# Patient Record
Sex: Female | Born: 1935
Health system: Southern US, Community
[De-identification: ages and names within clinical notes are randomized; demographics above are authoritative.]

## PROBLEM LIST (undated history)

## (undated) DIAGNOSIS — I10 Essential (primary) hypertension: Secondary | ICD-10-CM

## (undated) DIAGNOSIS — I491 Atrial premature depolarization: Secondary | ICD-10-CM

## (undated) DIAGNOSIS — R55 Syncope and collapse: Secondary | ICD-10-CM

## (undated) HISTORY — DX: Essential (primary) hypertension: I10

## (undated) HISTORY — PX: HERNIA REPAIR: SHX51

## (undated) HISTORY — PX: TONSILLECTOMY: SUR1361

## (undated) HISTORY — DX: Atrial premature depolarization: I49.1

## (undated) HISTORY — DX: Syncope and collapse: R55

---

## 2001-07-09 ENCOUNTER — Other Ambulatory Visit: Admission: RE | Admit: 2001-07-09 | Discharge: 2001-07-09 | Payer: Self-pay | Admitting: *Deleted

## 2002-03-03 ENCOUNTER — Other Ambulatory Visit: Admission: RE | Admit: 2002-03-03 | Discharge: 2002-03-03 | Payer: Self-pay | Admitting: Family Medicine

## 2004-03-23 ENCOUNTER — Other Ambulatory Visit: Admission: RE | Admit: 2004-03-23 | Discharge: 2004-03-23 | Payer: Self-pay | Admitting: Family Medicine

## 2006-06-06 ENCOUNTER — Encounter: Admission: RE | Admit: 2006-06-06 | Discharge: 2006-06-06 | Payer: Self-pay | Admitting: Surgery

## 2006-06-07 ENCOUNTER — Ambulatory Visit (HOSPITAL_BASED_OUTPATIENT_CLINIC_OR_DEPARTMENT_OTHER): Admission: RE | Admit: 2006-06-07 | Discharge: 2006-06-07 | Payer: Self-pay | Admitting: Surgery

## 2006-09-12 ENCOUNTER — Other Ambulatory Visit: Admission: RE | Admit: 2006-09-12 | Discharge: 2006-09-12 | Payer: Self-pay | Admitting: Family Medicine

## 2017-02-26 ENCOUNTER — Observation Stay (HOSPITAL_COMMUNITY)
Admission: EM | Admit: 2017-02-26 | Discharge: 2017-02-27 | Disposition: A | Discharge status: Home / Self Care | Payer: Medicare Other | Attending: Internal Medicine | Admitting: Internal Medicine

## 2017-02-26 ENCOUNTER — Encounter (HOSPITAL_COMMUNITY): Payer: Self-pay | Admitting: Emergency Medicine

## 2017-02-26 ENCOUNTER — Emergency Department (HOSPITAL_COMMUNITY): Payer: Medicare Other

## 2017-02-26 DIAGNOSIS — I471 Supraventricular tachycardia: Secondary | ICD-10-CM | POA: Diagnosis not present

## 2017-02-26 DIAGNOSIS — Z79899 Other long term (current) drug therapy: Secondary | ICD-10-CM | POA: Diagnosis not present

## 2017-02-26 DIAGNOSIS — R55 Syncope and collapse: Principal | ICD-10-CM | POA: Diagnosis present

## 2017-02-26 DIAGNOSIS — E876 Hypokalemia: Secondary | ICD-10-CM | POA: Diagnosis not present

## 2017-02-26 DIAGNOSIS — I10 Essential (primary) hypertension: Secondary | ICD-10-CM

## 2017-02-26 DIAGNOSIS — I479 Paroxysmal tachycardia, unspecified: Secondary | ICD-10-CM | POA: Diagnosis not present

## 2017-02-26 DIAGNOSIS — Z7901 Long term (current) use of anticoagulants: Secondary | ICD-10-CM | POA: Diagnosis not present

## 2017-02-26 DIAGNOSIS — Z880 Allergy status to penicillin: Secondary | ICD-10-CM | POA: Diagnosis not present

## 2017-02-26 DIAGNOSIS — I7389 Other specified peripheral vascular diseases: Secondary | ICD-10-CM | POA: Insufficient documentation

## 2017-02-26 DIAGNOSIS — I480 Paroxysmal atrial fibrillation: Secondary | ICD-10-CM | POA: Diagnosis not present

## 2017-02-26 DIAGNOSIS — N289 Disorder of kidney and ureter, unspecified: Secondary | ICD-10-CM | POA: Diagnosis not present

## 2017-02-26 DIAGNOSIS — R000 Tachycardia, unspecified: Secondary | ICD-10-CM | POA: Diagnosis present

## 2017-02-26 DIAGNOSIS — Z886 Allergy status to analgesic agent status: Secondary | ICD-10-CM | POA: Diagnosis not present

## 2017-02-26 DIAGNOSIS — I081 Rheumatic disorders of both mitral and tricuspid valves: Secondary | ICD-10-CM | POA: Diagnosis not present

## 2017-02-26 LAB — URINALYSIS, ROUTINE W REFLEX MICROSCOPIC
BACTERIA UA: NONE SEEN
BILIRUBIN URINE: NEGATIVE
GLUCOSE, UA: NEGATIVE mg/dL
HGB URINE DIPSTICK: NEGATIVE
KETONES UR: NEGATIVE mg/dL
Nitrite: NEGATIVE
PROTEIN: NEGATIVE mg/dL
Specific Gravity, Urine: 1.009 (ref 1.005–1.030)
pH: 7 (ref 5.0–8.0)

## 2017-02-26 LAB — CBC
HEMATOCRIT: 39.8 % (ref 36.0–46.0)
HEMOGLOBIN: 13.5 g/dL (ref 12.0–15.0)
MCH: 32.1 pg (ref 26.0–34.0)
MCHC: 33.9 g/dL (ref 30.0–36.0)
MCV: 94.8 fL (ref 78.0–100.0)
Platelets: 197 10*3/uL (ref 150–400)
RBC: 4.2 MIL/uL (ref 3.87–5.11)
RDW: 12.3 % (ref 11.5–15.5)
WBC: 7.3 10*3/uL (ref 4.0–10.5)

## 2017-02-26 LAB — BASIC METABOLIC PANEL
ANION GAP: 12 (ref 5–15)
BUN: 19 mg/dL (ref 6–20)
CHLORIDE: 102 mmol/L (ref 101–111)
CO2: 22 mmol/L (ref 22–32)
Calcium: 10.2 mg/dL (ref 8.9–10.3)
Creatinine, Ser: 1.05 mg/dL — ABNORMAL HIGH (ref 0.44–1.00)
GFR calc Af Amer: 52 mL/min — ABNORMAL LOW (ref >60)
GFR calc non Af Amer: 45 mL/min — ABNORMAL LOW (ref >60)
Glucose, Bld: 118 mg/dL — ABNORMAL HIGH (ref 65–99)
POTASSIUM: 3.4 mmol/L — AB (ref 3.5–5.1)
SODIUM: 136 mmol/L (ref 135–145)

## 2017-02-26 LAB — PROTIME-INR
INR: 1.06
Prothrombin Time: 13.9 seconds (ref 11.4–15.2)

## 2017-02-26 LAB — I-STAT TROPONIN, ED: Troponin i, poc: 0 ng/mL (ref 0.00–0.08)

## 2017-02-26 LAB — CBG MONITORING, ED: GLUCOSE-CAPILLARY: 111 mg/dL — AB (ref 65–99)

## 2017-02-26 MED ORDER — SODIUM CHLORIDE 0.9 % IV SOLN
INTRAVENOUS | Status: DC
  Administered 2017-02-26: via INTRAVENOUS

## 2017-02-26 MED ORDER — ALBUTEROL SULFATE (2.5 MG/3ML) 0.083% IN NEBU: 2.5000 mg | INHALATION_SOLUTION | RESPIRATORY_TRACT | Status: DC | PRN

## 2017-02-26 MED ORDER — ENOXAPARIN SODIUM 30 MG/0.3ML ~~LOC~~ SOLN
30.0000 mg | SUBCUTANEOUS | Status: DC
  Filled 2017-02-26: qty 0.3

## 2017-02-26 MED ORDER — POTASSIUM CHLORIDE CRYS ER 20 MEQ PO TBCR
40.0000 meq | EXTENDED_RELEASE_TABLET | Freq: Once | ORAL | Status: AC
  Administered 2017-02-26: 40 meq via ORAL
  Filled 2017-02-26: qty 2

## 2017-02-26 MED ORDER — AMLODIPINE BESYLATE 5 MG PO TABS
5.0000 mg | ORAL_TABLET | Freq: Every day | ORAL | Status: DC
  Administered 2017-02-27: 5 mg via ORAL
  Filled 2017-02-26: qty 1

## 2017-02-26 MED ORDER — ACETAMINOPHEN 650 MG RE SUPP: 650.0000 mg | Freq: Four times a day (QID) | RECTAL | Status: DC | PRN

## 2017-02-26 MED ORDER — ONDANSETRON HCL 4 MG/2ML IJ SOLN: 4.0000 mg | Freq: Four times a day (QID) | INTRAMUSCULAR | Status: DC | PRN

## 2017-02-26 MED ORDER — ACETAMINOPHEN 325 MG PO TABS: 650.0000 mg | ORAL_TABLET | Freq: Four times a day (QID) | ORAL | Status: DC | PRN

## 2017-02-26 MED ORDER — ONDANSETRON HCL 4 MG PO TABS: 4.0000 mg | ORAL_TABLET | Freq: Four times a day (QID) | ORAL | Status: DC | PRN

## 2017-02-26 NOTE — ED Notes (Signed)
Attempted report x1. 

## 2017-02-26 NOTE — Consult Note (Signed)
CARDIOLOGY CONSULT NOTE   Referring Physician: Dr. Donnald Garre Primary Physician: [facility] (inpatient) Primary Cardiologist: none Reason for Consultation: syncope   HPI: Stephanie Reyes is a very pleasant 83 yo woman with PMH HTN who presents after sudden syncope. She states that she was speaking to a neighbor and very suddenly lost consciousness. She slowly fell to the floor and denies trauma. She denies any presyncopal symptoms, including palpitations, lightheadedness, warmth, tunnel vision, nausea, etc. Upon awakening she denies confusion, loss of bowel/bladder control, tongue biting, or weakness. She states that "if I was alone I would have walked myself to the chair and continued my business." She denies any similar episodes, but family members state that she has had several episodes of lightheadedness, and there was another LOC event approximately one year ago. No clear precipitating stressors, no associated inducing maneuvers (ie micturation). Has been eating and drinking well.  Patient is generally healthy and takes only her antihypertensives, amlodipine and lisinopril. She denies any prior heart history. She denies chest pain, SOB, PND, orthopnea, LE edema, fevers, or chills.   Review of Systems:     Cardiac Review of Systems: {Y] = yes  = no  Chest Pain [    ]  Resting SOB [   ] Exertional SOB  [  ]  Orthopnea [  ]   Pedal Edema [   ]    Palpitations [  ] Syncope  [Y  ]   Presyncope [   ]  General Review of Systems: [Y] = yes [  ]=no Constitional: recent weight change [  ]; anorexia [  ]; fatigue [  ]; nausea [  ]; night sweats [  ]; fever [  ]; or chills [  ];       Eyes : blurred vision [  ]; diplopia [   ]; vision changes [  ];  Amaurosis fugax[  ]; Resp: cough [  ];  wheezing[  ];  hemoptysis[  ];  PND [  ];  GI:  gallstones[  ], vomiting[  ];  dysphagia[  ]; melena[  ];  hematochezia [  ]; heartburn[  ];   GU: kidney stones [  ]; hematuria[  ];   dysuria [  ];   nocturia[  ]; incontinence [  ];             Skin: rash, swelling[  ];, hair loss[  ];  peripheral edema[  ];  or itching[  ]; Musculosketetal: myalgias[  ];  joint swelling[  ];  joint erythema[  ];  joint pain[  ];  back pain[  ];  Heme/Lymph: bruising[  ];  bleeding[  ];  anemia[  ];  Neuro: TIA[  ];  headaches[  ];  stroke[  ];  vertigo[  ];  seizures[  ];   paresthesias[  ];  difficulty walking[  ];  Psych:depression[  ]; anxiety[  ];  Endocrine: diabetes[  ];  thyroid dysfunction[  ];  Other:  Past Medical History:  Diagnosis Date  . Hypertension      (Not in a hospital admission)  Infusions:  Allergies  Allergen Reactions  . Ampicillin Nausea And Vomiting  . Aspirin Nausea And Vomiting  . Penicillins Nausea And Vomiting and Swelling    Social History   Social History  . Marital status: Divorced    Spouse name: N/A  . Number of children: N/A  . Years of education: N/A   Occupational History  .  Not on file.   Social History Main Topics  . Smoking status: Never Smoker  . Smokeless tobacco: Never Used  . Alcohol use No  . Drug use: No  . Sexual activity: Not on file   Other Topics Concern  . Not on file   Social History Narrative  . No narrative on file    No family history on file.  PHYSICAL EXAM: Vitals:   02/26/17 2200 02/26/17 2230  BP: 125/79 (!) 122/92  Pulse: 86 97  Resp: 16 20  Temp:      No intake or output data in the 24 hours ending 02/26/17 2333  General:  Well appearing. No respiratory difficulty HEENT: NCAT, MMM Neck: supple. no JVD. Carotids 2+ bilat; no bruits. No lymphadenopathy or thryomegaly appreciated. Cor: PMI nondisplaced. Slightly irregular rhythm, slightly tachycardic, exam more consistent with PACs than PVCs. No m/r/g Lungs: clear Abdomen: soft, nontender, nondistended. No hepatosplenomegaly. No bruits or masses. Good bowel sounds. Extremities: no cyanosis, clubbing, rash, edema Neuro: alert & oriented x 3, cranial  nerves grossly intact. moves all 4 extremities w/o difficulty. Affect pleasant.  ECG: sinus tachycardia with PACs. Somewhat variable PR, though P wave morphology looks similar on exam  Results for orders placed or performed during the hospital encounter of 02/26/17 (from the past 24 hour(s))  Basic metabolic panel     Status: Abnormal   Collection Time: 02/26/17  7:31 PM  Result Value Ref Range   Sodium 136 135 - 145 mmol/L   Potassium 3.4 (L) 3.5 - 5.1 mmol/L   Chloride 102 101 - 111 mmol/L   CO2 22 22 - 32 mmol/L   Glucose, Bld 118 (H) 65 - 99 mg/dL   BUN 19 6 - 20 mg/dL   Creatinine, Ser 1.61 (H) 0.44 - 1.00 mg/dL   Calcium 09.6 8.9 - 04.5 mg/dL   GFR calc non Af Amer 45 (L) >60 mL/min   GFR calc Af Amer 52 (L) >60 mL/min   Anion gap 12 5 - 15  CBC     Status: None   Collection Time: 02/26/17  7:31 PM  Result Value Ref Range   WBC 7.3 4.0 - 10.5 K/uL   RBC 4.20 3.87 - 5.11 MIL/uL   Hemoglobin 13.5 12.0 - 15.0 g/dL   HCT 40.9 81.1 - 91.4 %   MCV 94.8 78.0 - 100.0 fL   MCH 32.1 26.0 - 34.0 pg   MCHC 33.9 30.0 - 36.0 g/dL   RDW 78.2 95.6 - 21.3 %   Platelets 197 150 - 400 K/uL  CBG monitoring, ED     Status: Abnormal   Collection Time: 02/26/17  8:10 PM  Result Value Ref Range   Glucose-Capillary 111 (H) 65 - 99 mg/dL  Urinalysis, Routine w reflex microscopic     Status: Abnormal   Collection Time: 02/26/17  8:46 PM  Result Value Ref Range   Color, Urine STRAW (A) YELLOW   APPearance CLEAR CLEAR   Specific Gravity, Urine 1.009 1.005 - 1.030   pH 7.0 5.0 - 8.0   Glucose, UA NEGATIVE NEGATIVE mg/dL   Hgb urine dipstick NEGATIVE NEGATIVE   Bilirubin Urine NEGATIVE NEGATIVE   Ketones, ur NEGATIVE NEGATIVE mg/dL   Protein, ur NEGATIVE NEGATIVE mg/dL   Nitrite NEGATIVE NEGATIVE   Leukocytes, UA MODERATE (A) NEGATIVE   RBC / HPF 0-5 0 - 5 RBC/hpf   WBC, UA 0-5 0 - 5 WBC/hpf   Bacteria, UA NONE SEEN NONE SEEN  Squamous Epithelial / LPF 0-5 (A) NONE SEEN   Mucous PRESENT     Hyaline Casts, UA PRESENT   I-stat troponin, ED     Status: None   Collection Time: 02/26/17  8:53 PM  Result Value Ref Range   Troponin i, poc 0.00 0.00 - 0.08 ng/mL   Comment 3          Protime-INR     Status: None   Collection Time: 02/26/17  8:54 PM  Result Value Ref Range   Prothrombin Time 13.9 11.4 - 15.2 seconds   INR 1.06    Ct Head Wo Contrast  Result Date: 02/26/2017 CLINICAL DATA:  Acute onset of syncope.  Initial encounter. EXAM: CT HEAD WITHOUT CONTRAST TECHNIQUE: Contiguous axial images were obtained from the base of the skull through the vertex without intravenous contrast. COMPARISON:  None. FINDINGS: Brain: No evidence of acute infarction, hemorrhage, hydrocephalus, extra-axial collection or mass lesion/mass effect. Prominence of the ventricles and sulci reflects mild cortical volume loss. Cerebellar atrophy is noted. Scattered periventricular white matter change likely reflects small vessel ischemic microangiopathy. The brainstem and fourth ventricle are within normal limits. The basal ganglia are unremarkable in appearance. The cerebral hemispheres demonstrate grossly normal gray-white differentiation. No mass effect or midline shift is seen. Vascular: No hyperdense vessel or unexpected calcification. Skull: There is no evidence of fracture; visualized osseous structures are unremarkable in appearance. There is suggestion of narrowing of the upper cervical spinal canal to 7 mm in AP dimension. Sinuses/Orbits: The orbits are within normal limits. The paranasal sinuses and mastoid air cells are well-aerated. Other: No significant soft tissue abnormalities are seen. IMPRESSION: 1. No acute intracranial pathology seen on CT. 2. Mild cortical volume loss and scattered small vessel ischemic microangiopathy. 3. Suggestion of chronic narrowing of the upper cervical spinal canal to 7 mm in AP dimension. Electronically Signed   By: Roanna Raider M.D.   On: 02/26/2017 21:47      ASSESSMENT/PLAN: Corrine Buskirk is a very pleasant 83 yo woman with PMH HTN presenting with syncope. Denies prior cardiac history. No prodrome or postictal symptoms. Family endorses one prior event one year ago with full LOC, does have some lightheadedness. No chest pain or heart failure symptoms  1) Syncope: age is risk factor for conduction disease. Was having frequent PACs on my exam -monitor on telemetry overnight to exclude pauses or arrhythmias -orthostatic vital signs -echo in AM to rule out structural heart disease -if unrevealing workup, could consider Holter as outpatient -cardiology team to see tomorrow to review data

## 2017-02-26 NOTE — ED Provider Notes (Signed)
[facility]-EMERGENCY DEPT Provider Note   CSN: [id number] Arrival date & time: 02/26/17  1909     History   Chief Complaint Chief Complaint  Patient presents with  . Loss of Consciousness  . Irregular Heart Beat    HPI Stephanie Reyes is a 83 y.o. female.  HPI At baseline, patient is very active and lives in independent lifestyle. Today she had already been to meetings and was back home preparing her lunch. Neighbor came to the door and as she was talking to that neighbor she suddenly collapsed to the ground. She denies she had any preceding symptoms. The neighbor described to her as if she just sort of folded and sank to the ground. He denies any associated injury. She reports that she awakened with the neighbor placing a pillow under her head. She reports initially she was aware that the other person was doing that but could not respond but then quickly became aware and was back to normal. 9 she had any headache or focal weakness numbness or tingling. No medication changes. She takes her lisinopril and amlodipine in the mornings. Past Medical History:  Diagnosis Date  . Hypertension     There are no active problems to display for this patient.   Past Surgical History:  Procedure Laterality Date  . HERNIA REPAIR    . TONSILLECTOMY      OB History    No data available       Home Medications    Prior to Admission medications   Medication Sig Start Date End Date Taking? Authorizing Provider  amLODipine (NORVASC) 5 MG tablet Take 5 mg by mouth daily. 12/04/16  Yes Historical Provider, MD  lisinopril-hydrochlorothiazide (PRINZIDE,ZESTORETIC) 20-25 MG tablet Take 1 tablet by mouth daily. 12/06/16  Yes Historical Provider, MD  Multiple Vitamin (MULTIVITAMIN WITH MINERALS) TABS tablet Take 1 tablet by mouth every other day.   Yes Historical Provider, MD  OVER THE COUNTER MEDICATION Take 1 tablet by mouth every other day. For bones   Yes Historical Provider, MD    Family  History No family history on file.  Social History Social History  Substance Use Topics  . Smoking status: Never Smoker  . Smokeless tobacco: Never Used  . Alcohol use No     Allergies   Ampicillin; Aspirin; and Penicillins   Review of Systems Review of Systems 10 Systems reviewed and are negative for acute change except as noted in the HPI.   Physical Exam Updated Vital Signs Temp 97.6 F (36.4 C) (Oral)   Ht  (1.651 m)   Wt 120 lb (54.4 kg)   SpO2 97%   BMI 19.97 kg/m   Physical Exam  Constitutional: She is oriented to person, place, and time. She appears well-developed and well-nourished. No distress.  HENT:  Head: Normocephalic and atraumatic.  Right Ear: External ear normal.  Left Ear: External ear normal.  Nose: Nose normal.  Mouth/Throat: Oropharynx is clear and moist.  Eyes: Conjunctivae and EOM are normal. Pupils are equal, round, and reactive to light.  Neck: Neck supple.  Cardiovascular: Normal rate and intact distal pulses.   No murmur heard. Borderline tachycardia with frequent ectopy.  Pulmonary/Chest: Effort normal and breath sounds normal. No respiratory distress.  Abdominal: Soft. She exhibits no distension. There is no tenderness.  Musculoskeletal: Normal range of motion. She exhibits no edema, tenderness or deformity.  Neurological: She is alert and oriented to person, place, and time. No cranial nerve deficit. She exhibits normal muscle  tone. Coordination normal.  Patient has excellent strength and normal range of motion. She can fully flex lower extremities and push firmly against resistance.  Skin: Skin is warm and dry.  Psychiatric: She has a normal mood and affect.  Nursing note and vitals reviewed.    ED Treatments / Results  Labs (all labs ordered are listed, but only abnormal results are displayed) Labs Reviewed  BASIC METABOLIC PANEL - Abnormal; Notable for the following:       Result Value   Potassium 3.4 (*)    Glucose,  Bld 118 (*)    Creatinine, Ser 1.05 (*)    GFR calc non Af Amer 45 (*)    GFR calc Af Amer 52 (*)    All other components within normal limits  URINALYSIS, ROUTINE W REFLEX MICROSCOPIC - Abnormal; Notable for the following:    Color, Urine STRAW (*)    Leukocytes, UA MODERATE (*)    Squamous Epithelial / LPF 0-5 (*)    All other components within normal limits  CBG MONITORING, ED - Abnormal; Notable for the following:    Glucose-Capillary 111 (*)    All other components within normal limits  CBC  PROTIME-INR  I-STAT TROPOININ, ED    EKG  EKG Interpretation None       Radiology Ct Head Wo Contrast  Result Date: 02/26/2017 CLINICAL DATA:  Acute onset of syncope.  Initial encounter. EXAM: CT HEAD WITHOUT CONTRAST TECHNIQUE: Contiguous axial images were obtained from the base of the skull through the vertex without intravenous contrast. COMPARISON:  None. FINDINGS: Brain: No evidence of acute infarction, hemorrhage, hydrocephalus, extra-axial collection or mass lesion/mass effect. Prominence of the ventricles and sulci reflects mild cortical volume loss. Cerebellar atrophy is noted. Scattered periventricular white matter change likely reflects small vessel ischemic microangiopathy. The brainstem and fourth ventricle are within normal limits. The basal ganglia are unremarkable in appearance. The cerebral hemispheres demonstrate grossly normal gray-white differentiation. No mass effect or midline shift is seen. Vascular: No hyperdense vessel or unexpected calcification. Skull: There is no evidence of fracture; visualized osseous structures are unremarkable in appearance. There is suggestion of narrowing of the upper cervical spinal canal to 7 mm in AP dimension. Sinuses/Orbits: The orbits are within normal limits. The paranasal sinuses and mastoid air cells are well-aerated. Other: No significant soft tissue abnormalities are seen. IMPRESSION: 1. No acute intracranial pathology seen on CT. 2.  Mild cortical volume loss and scattered small vessel ischemic microangiopathy. 3. Suggestion of chronic narrowing of the upper cervical spinal canal to 7 mm in AP dimension. Electronically Signed   By: Roanna Raider M.D.   On: 02/26/2017 21:47    Procedures Procedures (including critical care time)  Medications Ordered in ED Medications  potassium chloride SA (K-DUR,KLOR-CON) CR tablet 40 mEq (not administered)     Initial Impression / Assessment and Plan / ED Course  I have reviewed the triage vital signs and the nursing notes.  Pertinent labs & imaging results that were available during my care of the patient were reviewed by me and considered in my medical decision making (see chart for details).     Consult: Cardiology fellow. Recommend overnight monitored observation on hospitalist service, a.m. echo Consult: [facility] for [facility] Final Clinical Impressions(s) / ED Diagnoses   Final diagnoses:  Syncope and collapse  Patient is otherwise very healthy and active 83 year old female. She had unprovoked and syncopal episode. At this time, plan will be for ongoing monitoring and additional  diagnostic evaluation in the a.m.  New Prescriptions New Prescriptions   No medications on file     Arby Barrette, MD 02/26/17 2234

## 2017-02-26 NOTE — H&P (Addendum)
History and Physical    ANGELEIGH CHIASSON Reyes:[id number] DOB: 02-14-1924 DOA: 02/26/2017  Referring MD/NP/PA: Dr. Clarice Pole PCP: Hollice Espy, MD  Patient coming from: Apartment via EMS  Chief Complaint: Syncope and collapse  HPI: Stephanie Reyes is a 83 y.o. female with medical history significant of HTN; who presents after having syncopal episode at home. At baseline the patient is very active and lives alone in an apartment complex, and does not utilize any assistive aids to ambulate.Patient reports waking up this morning having a small breakfast and taking her blood pressure medications as usual. She did not have time to eat her lunch because of a presentation that she was getting about tiny houses later that day. Following the meeting sometime around 4:30 PM she was about to eat her lunch the doorbell rang. She went to the door and reportedly was staring off blankly at the neighbor unable to speak prior to folding up and sinking to the floor. Denies any trauma to her head, chest pain, palpitations, focal weakness, dysuria, or urinary frequency. Patient notes that her Sandrea Hammond was discontinued recently, but denies any other medication changes. Family notes that the patient has intermittently reported being dizzy with standing  ED Course: Upon admission into the emergency department patient was seen to be afebrile, heart rates 86-104, respirations 16-24, blood pressures maintain, and O2 saturations 95-98% on room air. Labs revealed Potassium 3.4, BUN 19, creatinine 1.05, and all other labs relatively within normal limits. CT scan of the brain showed no acute abnormalities and chronic narrowing of the cervical spinal canal. Family asked that the patient notes intermittently  Review of Systems: As per HPI otherwise 10 point review of systems negative.   Past Medical History:  Diagnosis Date  . Hypertension     Past Surgical History:  Procedure Laterality Date  . HERNIA REPAIR    .  TONSILLECTOMY       reports that she has never smoked. She has never used smokeless tobacco. She reports that she does not drink alcohol or use drugs.  Allergies  Allergen Reactions  . Ampicillin Nausea And Vomiting  . Aspirin Nausea And Vomiting  . Penicillins Nausea And Vomiting and Swelling    No family history on file.  Prior to Admission medications   Medication Sig Start Date End Date Taking? Authorizing Provider  amLODipine (NORVASC) 5 MG tablet Take 5 mg by mouth daily. 12/04/16  Yes Historical Provider, MD  lisinopril-hydrochlorothiazide (PRINZIDE,ZESTORETIC) 20-25 MG tablet Take 1 tablet by mouth daily. 12/06/16  Yes Historical Provider, MD  Multiple Vitamin (MULTIVITAMIN WITH MINERALS) TABS tablet Take 1 tablet by mouth every other day.   Yes Historical Provider, MD  OVER THE COUNTER MEDICATION Take 1 tablet by mouth every other day. For bones   Yes Historical Provider, MD    Physical Exam:   Constitutional: NAD, calm, comfortable Vitals:   02/26/17 2030 02/26/17 2100 02/26/17 2200 02/26/17 2230  BP: (!) 137/98 117/87 125/79 (!) 122/92  Pulse: (!) 104 91 86 97  Resp: (!) Temp:      TempSrc:      SpO2: 95% 98% 98% 98%  Weight:      Height:       Eyes: PERRL, lids and conjunctivae normal ENMT: Mucous membranes are moist. Posterior pharynx clear of any exudate or lesions.Normal dentition.  Neck: normal, supple, no masses, no thyromegaly Respiratory: clear to auscultation bilaterally, no wheezing, no crackles. Normal respiratory effort. No accessory muscle use.  Cardiovascular: Tachycardic with intermittent extra beats, no murmurs / rubs / gallops. No extremity edema. 2+ pedal pulses. No carotid bruits.  Abdomen: no tenderness, no masses palpated. No hepatosplenomegaly. Bowel sounds positive.  Musculoskeletal: no clubbing / cyanosis. No joint deformity upper and lower extremities. Good ROM, no contractures. Normal muscle tone.  Skin: Poor skin turgor with  tenting noted. no rashes, lesions, ulcers. No induration Neurologic: CN 2-12 grossly intact. Sensation intact, DTR normal. Strength 5/5 in all 4.  Psychiatric: Normal judgment and insight. Alert and oriented x 3. Normal mood.     Labs on Admission: I have personally reviewed following labs and imaging studies  CBC:  Recent Labs Lab 02/26/17 1931  WBC 7.3  HGB 13.5  HCT 39.8  MCV 94.8  PLT 197   Basic Metabolic Panel:  Recent Labs Lab 02/26/17 1931  NA 136  K 3.4*  CL 102  CO2 22  GLUCOSE 118*  BUN 19  CREATININE 1.05*  CALCIUM 10.2   GFR: Estimated Creatinine Clearance: 30 mL/min (A) (by C-G formula based on SCr of 1.05 mg/dL (H)). Liver Function Tests: No results for input(s): AST, ALT, ALKPHOS, BILITOT, PROT, ALBUMIN in the last 168 hours. No results for input(s): LIPASE, AMYLASE in the last 168 hours. No results for input(s): AMMONIA in the last 168 hours. Coagulation Profile:  Recent Labs Lab 02/26/17 2054  INR 1.06   Cardiac Enzymes: No results for input(s): CKTOTAL, CKMB, CKMBINDEX, TROPONINI in the last 168 hours. BNP (last 3 results) No results for input(s): PROBNP in the last 8760 hours. HbA1C: No results for input(s): HGBA1C in the last 72 hours. CBG:  Recent Labs Lab 02/26/17 2010  GLUCAP 111*   Lipid Profile: No results for input(s): CHOL, HDL, LDLCALC, TRIG, CHOLHDL, LDLDIRECT in the last 72 hours. Thyroid Function Tests: No results for input(s): TSH, T4TOTAL, FREET4, T3FREE, THYROIDAB in the last 72 hours. Anemia Panel: No results for input(s): VITAMINB12, FOLATE, FERRITIN, TIBC, IRON, RETICCTPCT in the last 72 hours. Urine analysis:    Component Value Date/Time   COLORURINE STRAW (A) 02/26/2017 2046   APPEARANCEUR CLEAR 02/26/2017 2046   LABSPEC 1.009 02/26/2017 2046   PHURINE 7.0 02/26/2017 2046   GLUCOSEU NEGATIVE 02/26/2017 2046   HGBUR NEGATIVE 02/26/2017 2046   BILIRUBINUR NEGATIVE 02/26/2017 2046   KETONESUR NEGATIVE  02/26/2017 2046   PROTEINUR NEGATIVE 02/26/2017 2046   NITRITE NEGATIVE 02/26/2017 2046   LEUKOCYTESUR MODERATE (A) 02/26/2017 2046   Sepsis Labs: No results found for this or any previous visit (from the past 240 hour(s)).   Radiological Exams on Admission: Ct Head Wo Contrast  Result Date: 02/26/2017 CLINICAL DATA:  Acute onset of syncope.  Initial encounter. EXAM: CT HEAD WITHOUT CONTRAST TECHNIQUE: Contiguous axial images were obtained from the base of the skull through the vertex without intravenous contrast. COMPARISON:  None. FINDINGS: Brain: No evidence of acute infarction, hemorrhage, hydrocephalus, extra-axial collection or mass lesion/mass effect. Prominence of the ventricles and sulci reflects mild cortical volume loss. Cerebellar atrophy is noted. Scattered periventricular white matter change likely reflects small vessel ischemic microangiopathy. The brainstem and fourth ventricle are within normal limits. The basal ganglia are unremarkable in appearance. The cerebral hemispheres demonstrate grossly normal gray-white differentiation. No mass effect or midline shift is seen. Vascular: No hyperdense vessel or unexpected calcification. Skull: There is no evidence of fracture; visualized osseous structures are unremarkable in appearance. There is suggestion of narrowing of the upper cervical spinal canal to 7 mm in AP dimension. Sinuses/Orbits:  The orbits are within normal limits. The paranasal sinuses and mastoid air cells are well-aerated. Other: No significant soft tissue abnormalities are seen. IMPRESSION: 1. No acute intracranial pathology seen on CT. 2. Mild cortical volume loss and scattered small vessel ischemic microangiopathy. 3. Suggestion of chronic narrowing of the upper cervical spinal canal to 7 mm in AP dimension. Electronically Signed   By: Roanna Raider M.D.   On: 02/26/2017 21:47    EKG: Independently reviewed. Sinus tachycardia with intermittent premature  complexes.  Assessment/Plan Syncope and collapse: Acute. Patient reports having a busy day with decreased oral intake. Orthostatic vital signs showed significant increase in heart rate 28 bpm with sitting to standing although no significant decrease in blood pressure noted. Question possibility of POTS  dehydration as  cause of symptoms. Cardiology was consulted by the ER physician and recommended checking an echocardiogram and monitoring overnight. - Admit to telemetry bed - IV fluids NS at 75 ml/hr - Check echocardiogram in a.m - recheck orthostatic vital signs in a.m. - Physical therapy to eval and treat and  Hypokalemia: Acute. Patient's initial potassium was mildly low at 3.4. Patient was given 40 meq of potassium chloride while in the ED. - Continue to monitor, and replace as needed  Sinus tachycardia - Follow-up telemetry overnight  Essential hypertension  - Continue amlodipine  - hold lisinopril-hydrochlorothiazide as question an aspect of dehydration  Renal insufficiency: Creatinine on admission noted be 1.05 with BUN 19. No other previous values available. Question the possibility of some aspect of dehydration to the patient's presenting symptoms. - Follow repeat BMP in a.m.   DVT prophylaxis: lovenox   Code Status: DNR   Family Communication: Discussed plan of care with the patient and family present at bedside  Disposition Plan:  Likely discharge home if work up negative. Consults called: Cards Admission status: inpatient  Clydie Braun MD [facility] Pager [phone]  If 7PM-7AM, please contact night-coverage [email or web address] Password [facility]  02/26/2017, 11:20 PM

## 2017-02-26 NOTE — ED Triage Notes (Addendum)
Per [facility],  Pt from home where she lives alone. Pt reports being at a meeting with a friend, she stopped talking and stared off for a minute and lost consciousness. Pt states she may have hit her head on the door. Pt reports she did not eat/drink as well today as she normally does. Pt denies pain at this time. Pt reports some dizziness and nausea. Pt denies chest pain, SHOB. Pt only has hx of HTN. EMS reports pt in A-fib. Pt currently in A-fib, HR currently 101. Pt alert and oriented, VSS.

## 2017-02-27 ENCOUNTER — Other Ambulatory Visit: Payer: Self-pay | Admitting: Physician Assistant

## 2017-02-27 ENCOUNTER — Observation Stay (HOSPITAL_BASED_OUTPATIENT_CLINIC_OR_DEPARTMENT_OTHER): Payer: Medicare Other

## 2017-02-27 DIAGNOSIS — I10 Essential (primary) hypertension: Secondary | ICD-10-CM | POA: Diagnosis present

## 2017-02-27 DIAGNOSIS — R946 Abnormal results of thyroid function studies: Secondary | ICD-10-CM | POA: Diagnosis not present

## 2017-02-27 DIAGNOSIS — I480 Paroxysmal atrial fibrillation: Secondary | ICD-10-CM

## 2017-02-27 DIAGNOSIS — R55 Syncope and collapse: Secondary | ICD-10-CM | POA: Diagnosis not present

## 2017-02-27 DIAGNOSIS — R000 Tachycardia, unspecified: Secondary | ICD-10-CM

## 2017-02-27 DIAGNOSIS — I479 Paroxysmal tachycardia, unspecified: Secondary | ICD-10-CM | POA: Diagnosis not present

## 2017-02-27 DIAGNOSIS — N289 Disorder of kidney and ureter, unspecified: Secondary | ICD-10-CM | POA: Diagnosis not present

## 2017-02-27 DIAGNOSIS — E876 Hypokalemia: Secondary | ICD-10-CM | POA: Diagnosis not present

## 2017-02-27 LAB — CBC WITH DIFFERENTIAL/PLATELET
Basophils Absolute: 0 10*3/uL (ref 0.0–0.1)
Basophils Relative: 0 %
EOS ABS: 0 10*3/uL (ref 0.0–0.7)
Eosinophils Relative: 0 %
HCT: 41.2 % (ref 36.0–46.0)
Hemoglobin: 14.1 g/dL (ref 12.0–15.0)
LYMPHS ABS: 1.6 10*3/uL (ref 0.7–4.0)
Lymphocytes Relative: 22 %
MCH: 32.6 pg (ref 26.0–34.0)
MCHC: 34.2 g/dL (ref 30.0–36.0)
MCV: 95.2 fL (ref 78.0–100.0)
Monocytes Absolute: 0.4 10*3/uL (ref 0.1–1.0)
Monocytes Relative: 6 %
NEUTROS PCT: 72 %
Neutro Abs: 5.2 10*3/uL (ref 1.7–7.7)
PLATELETS: 231 10*3/uL (ref 150–400)
RBC: 4.33 MIL/uL (ref 3.87–5.11)
RDW: 12.2 % (ref 11.5–15.5)
WBC: 7.2 10*3/uL (ref 4.0–10.5)

## 2017-02-27 LAB — ECHOCARDIOGRAM COMPLETE
CHL CUP RV SYS PRESS: 52 mmHg
E decel time: 296 msec
EERAT: 10.05
FS: 36 % (ref 28–44)
Height: 65 in
IV/PV OW: 1.14
LA ID, A-P, ES: 30 mm
LA diam end sys: 30 mm
LA vol A4C: 31.2 ml
LA vol index: 24.7 mL/m2
LA vol: 39.2 mL
LADIAMINDEX: 1.89 cm/m2
LV TDI E'LATERAL: 8.25
LV TDI E'MEDIAL: 4.39
LVEEAVG: 10.05
LVEEMED: 10.05
LVELAT: 8.25 cm/s
LVOT VTI: 12.8 cm
LVOT area: 3.46 cm2
LVOTD: 21 mm
LVOTPV: 72.4 cm/s
LVOTSV: 44 mL
MV Dec: 296
MV pk A vel: 87.8 m/s
MV pk E vel: 82.9 m/s
MVPG: 3 mmHg
P 1/2 time: 619 ms
PW: 12 mm — AB (ref 0.6–1.1)
RV LATERAL S' VELOCITY: 15.7 cm/s
Reg peak vel: 350 cm/s
TAPSE: 21.5 mm
TR max vel: 350 cm/s
Weight: 1912 oz

## 2017-02-27 LAB — BASIC METABOLIC PANEL
Anion gap: 10 (ref 5–15)
BUN: 16 mg/dL (ref 6–20)
CALCIUM: 9.8 mg/dL (ref 8.9–10.3)
CO2: 23 mmol/L (ref 22–32)
CREATININE: 1.04 mg/dL — AB (ref 0.44–1.00)
Chloride: 102 mmol/L (ref 101–111)
GFR calc Af Amer: 53 mL/min — ABNORMAL LOW (ref >60)
GFR, EST NON AFRICAN AMERICAN: 46 mL/min — AB (ref >60)
GLUCOSE: 171 mg/dL — AB (ref 65–99)
Potassium: 3.6 mmol/L (ref 3.5–5.1)
Sodium: 135 mmol/L (ref 135–145)

## 2017-02-27 LAB — MAGNESIUM: MAGNESIUM: 1.6 mg/dL — AB (ref 1.7–2.4)

## 2017-02-27 LAB — TROPONIN I: Troponin I: <0.03 ng/mL (ref <0.03)

## 2017-02-27 LAB — T4, FREE: Free T4: 0.81 ng/dL (ref 0.61–1.12)

## 2017-02-27 LAB — TSH: TSH: 6.118 u[IU]/mL — ABNORMAL HIGH (ref 0.350–4.500)

## 2017-02-27 MED ORDER — MAGNESIUM SULFATE 2 GM/50ML IV SOLN
2.0000 g | Freq: Once | INTRAVENOUS | Status: AC
  Administered 2017-02-27: 2 g via INTRAVENOUS
  Filled 2017-02-27: qty 50

## 2017-02-27 MED ORDER — AMLODIPINE BESYLATE 5 MG PO TABS: 2.5000 mg | ORAL_TABLET | Freq: Every day | ORAL | 0 refills | Status: DC

## 2017-02-27 MED ORDER — METOPROLOL TARTRATE 12.5 MG HALF TABLET
12.5000 mg | ORAL_TABLET | Freq: Two times a day (BID) | ORAL | Status: DC
  Administered 2017-02-27: 12.5 mg via ORAL
  Filled 2017-02-27: qty 1

## 2017-02-27 MED ORDER — METOPROLOL TARTRATE 25 MG PO TABS: 12.5000 mg | ORAL_TABLET | Freq: Two times a day (BID) | ORAL | 0 refills | Status: DC

## 2017-02-27 MED ORDER — AMLODIPINE BESYLATE 5 MG PO TABS: 2.5000 mg | ORAL_TABLET | Freq: Every day | ORAL | Status: DC

## 2017-02-27 NOTE — Discharge Summary (Signed)
Physician Discharge Summary  Stephanie Reyes ZOX:[id number] DOB: [birth year] DOA: 02/26/2017  PCP: Hollice Espy, MD  Admit date: 02/26/2017 Discharge date: 02/27/2017  Admitted From: home Disposition:  home  Recommendations for Outpatient Follow-up:  1. Follow up with PCP in 1-2 weeks. Please follow free T3 and T4 as outpatient. 2. Follow up with [facility] cardiology in 2 weeks. will arrange outpatient holter monitoring  Home Health:none Equipment/Devices: none  Discharge Condition:Stable CODE STATUS: DO NOT RESUSCITATE Diet recommendation: Regular    Discharge Diagnoses:  Principal Problem:   Syncope and collapse   Active Problems:   Hypokalemia   Benign essential HTN   Renal insufficiency   AF (paroxysmal atrial fibrillation) (HCC)   Sinus tachycardia  Brief narrative/history of present illness 83 year old female with history of hypertension presented with a syncopal episode at home. At baseline she is quite active and lives in an apartment complex. On the afternoon of admission she went to open her door and reportedly was stating blankly at her neighbor, unable to speak prior to slumping on the floor. No head trauma, chest pain, headache, dizziness, palpitations, bowel or urinary incontinence. She was on Boniva which was discontinued recently. No other change in medications. As per family she was intermittently complaining of being dizzy on standing.  In the ED she was afebrile, heart rate was ranging between 80-105, blood pressure was stable but on checking orthostasis she was tachycardic to 120s (no significant drop in blood pressure). She had potassium of 3.4 but other labs were normal. Head CT was unremarkable for acute findings. EKG and troponin was unremarkable. Patient was placed on observation for further management. Cardiology consulted.  Hospital course Syncope and collapse (HCC) -Possibly orthostatic versus paroxysmal tachycardia seen on telemetry. The heart  rate was irregularly irregular but no clear A. fib and could sinus tachycardia with PACs versus MAT. -Held HCTZ/lisinopril after cardiology recommendations and reduced amlodipine dose to 2.5 mg daily. Added low dose metoprolol (2.5 mg twice a day) 2-D echo shows normal EF of 55-60 %, no wall motion abnormality, shows grade 1 diastolic dysfunction mild MR, moderate TR and moderately elevated pulmonary pressure (52 mmHg). -Patient remains asymptomatic. Seen by PT and no further need.  Patient's CHADS2VASC score is 4. Cardiology recommended to hold on anticoagulation and obtain 7 outpatient Holter monitoring. If Holter shows A. fib, plan on starting her on low-dose Eliquis 2.5 mg bid. She is allergic to aspirin.  Paroxysmal A. fib versus sinus tachycardia with PACs. Plan as outlined above. Added low dose beta blocker. 2-D echo findings as above. Holter monitoring as outpatient and cardiology follow-up.  Hypokalemia/hypomagnesemia Replenished  ? Elevated TSH Order free T3 and T4. Can be followed as outpatient.  Stable to be discharged home with outpatient follow-up.  Consults: Cardiology  Procedures:  Head CT 2-D echo  Family communication: Son at bedside  Discharge Instructions   Allergies as of 02/27/2017      Reactions   Ampicillin Nausea And Vomiting   Aspirin Nausea And Vomiting   Penicillins Nausea And Vomiting, Swelling      Medication List    STOP taking these medications   lisinopril-hydrochlorothiazide 20-25 MG tablet Commonly known as:  PRINZIDE,ZESTORETIC     TAKE these medications   amLODipine 5 MG tablet Commonly known as:  NORVASC Take 0.5 tablets (2.5 mg total) by mouth daily. What changed:  how much to take   metoprolol tartrate 25 MG tablet Commonly known as:  LOPRESSOR Take 0.5 tablets (12.5 mg total)  by mouth 2 (two) times daily.   multivitamin with minerals Tabs tablet Take 1 tablet by mouth every other day.   OVER THE COUNTER MEDICATION Take  1 tablet by mouth every other day. For bones      Follow-up Information    Hollice Espy, MD. Schedule an appointment as soon as possible for a visit in 1 week(s).   Specialty:  Family Medicine Contact information: [address] [place] [address] [phone]        Chrystie Nose, MD. Call in 2 week(s).   Specialty:  Cardiology Contact information: [address] [place] [address] [place] [address] [phone]          Allergies  Allergen Reactions  . Ampicillin Nausea And Vomiting  . Aspirin Nausea And Vomiting  . Penicillins Nausea And Vomiting and Swelling      Procedures/Studies: Ct Head Wo Contrast  Result Date: 02/26/2017 CLINICAL DATA:  Acute onset of syncope.  Initial encounter. EXAM: CT HEAD WITHOUT CONTRAST TECHNIQUE: Contiguous axial images were obtained from the base of the skull through the vertex without intravenous contrast. COMPARISON:  None. FINDINGS: Brain: No evidence of acute infarction, hemorrhage, hydrocephalus, extra-axial collection or mass lesion/mass effect. Prominence of the ventricles and sulci reflects mild cortical volume loss. Cerebellar atrophy is noted. Scattered periventricular white matter change likely reflects small vessel ischemic microangiopathy. The brainstem and fourth ventricle are within normal limits. The basal ganglia are unremarkable in appearance. The cerebral hemispheres demonstrate grossly normal gray-white differentiation. No mass effect or midline shift is seen. Vascular: No hyperdense vessel or unexpected calcification. Skull: There is no evidence of fracture; visualized osseous structures are unremarkable in appearance. There is suggestion of narrowing of the upper cervical spinal canal to 7 mm in AP dimension. Sinuses/Orbits: The orbits are within normal limits. The paranasal sinuses and mastoid air cells are well-aerated. Other: No significant soft tissue abnormalities are seen. IMPRESSION: 1. No  acute intracranial pathology seen on CT. 2. Mild cortical volume loss and scattered small vessel ischemic microangiopathy. 3. Suggestion of chronic narrowing of the upper cervical spinal canal to 7 mm in AP dimension. Electronically Signed   By: Roanna Raider M.D.   On: 02/26/2017 21:47    2-D echo Study Conclusions  - Left ventricle: The cavity size was normal. There was mild focal   basal hypertrophy of the septum. Systolic function was normal.   The estimated ejection fraction was in the range of 55% to 60%.   Wall motion was normal; there were no regional wall motion   abnormalities. Doppler parameters are consistent with abnormal   left ventricular relaxation (grade 1 diastolic dysfunction).   Doppler parameters are consistent with high ventricular filling   pressure. - Aortic valve: There was trivial regurgitation. - Aortic root: The aortic root was mildly dilated. - Mitral valve: There was mild regurgitation. - Tricuspid valve: There was moderate regurgitation. - Pulmonary arteries: Systolic pressure was moderately increased.   PA peak pressure: 52 mm Hg (S).  Impressions:  - Normal LV systolic function; mild LVH; grade 1 diastolic   dysfunction; mildly dilated aortic root; trace AI; mild MR;   moderate TR; moderately elevated pulmonary pressure.  Subjective: Denies any dizziness or lightheadedness. Denies chest pain or palpitations. Able to walk to the bathroom without difficulty.  Discharge Exam: Vitals:   02/27/17 1026 02/27/17 1027  BP:    Pulse: (!) 150 72  Resp:  18  Temp:  98.1 F (36.7 C)  Vitals:   02/26/17 2347 02/27/17 0637 02/27/17 1026 02/27/17 1027  BP: 117/89 136/83    Pulse: (!) 122 86 (!) 150 72  Resp: Temp: 97.7 F (36.5 C) 97.4 F (36.3 C)  98.1 F (36.7 C)  TempSrc: Oral Oral  Oral  SpO2: 97% 99%  97%  Weight: 54.2 kg (119 lb 8 oz)     Height:  (1.651 m)       General: Elderly female not in distress HEENT: Moist  mucosa, supple neck Chest: Clear to auscultation bilaterally CVS: S1 and S2 regular, no murmurs rub or gallop GI: Soft, nondistended, nontender Musculoskeletal: Warm, no edema     The results of significant diagnostics from this hospitalization (including imaging, microbiology, ancillary and laboratory) are listed below for reference.     Microbiology: No results found for this or any previous visit (from the past 240 hour(s)).   Labs: BNP (last 3 results) No results for input(s): BNP in the last 8760 hours. Basic Metabolic Panel:  Recent Labs Lab 02/26/17 1931 02/27/17 0003  NA 136 135  K 3.4* 3.6  CL 102 102  CO2 22 23  GLUCOSE 118* 171*  BUN 19 16  CREATININE 1.05* 1.04*  CALCIUM 10.2 9.8  MG  --  1.6*   Liver Function Tests: No results for input(s): AST, ALT, ALKPHOS, BILITOT, PROT, ALBUMIN in the last 168 hours. No results for input(s): LIPASE, AMYLASE in the last 168 hours. No results for input(s): AMMONIA in the last 168 hours. CBC:  Recent Labs Lab 02/26/17 1931 02/27/17 0003  WBC 7.3 7.2  NEUTROABS  --  5.2  HGB 13.5 14.1  HCT 39.8 41.2  MCV 94.8 95.2  PLT 197 231   Cardiac Enzymes:  Recent Labs Lab 02/27/17 0214  TROPONINI <0.03   BNP: Invalid input(s): POCBNP CBG:  Recent Labs Lab 02/26/17 2010  GLUCAP 111*   D-Dimer No results for input(s): DDIMER in the last 72 hours. Hgb A1c No results for input(s): HGBA1C in the last 72 hours. Lipid Profile No results for input(s): CHOL, HDL, LDLCALC, TRIG, CHOLHDL, LDLDIRECT in the last 72 hours. Thyroid function studies  Recent Labs  02/27/17 0003  TSH 6.118*   Anemia work up No results for input(s): VITAMINB12, FOLATE, FERRITIN, TIBC, IRON, RETICCTPCT in the last 72 hours. Urinalysis    Component Value Date/Time   COLORURINE STRAW (A) 02/26/2017 2046   APPEARANCEUR CLEAR 02/26/2017 2046   LABSPEC 1.009 02/26/2017 2046   PHURINE 7.0 02/26/2017 2046   GLUCOSEU NEGATIVE 02/26/2017  2046   HGBUR NEGATIVE 02/26/2017 2046   BILIRUBINUR NEGATIVE 02/26/2017 2046   KETONESUR NEGATIVE 02/26/2017 2046   PROTEINUR NEGATIVE 02/26/2017 2046   NITRITE NEGATIVE 02/26/2017 2046   LEUKOCYTESUR MODERATE (A) 02/26/2017 2046   Sepsis Labs Invalid input(s): PROCALCITONIN,  WBC,  LACTICIDVEN Microbiology No results found for this or any previous visit (from the past 240 hour(s)).   Time coordinating discharge: < 30 minutes  SIGNED:   Eddie North, MD  [facility] 02/27/2017, 3:17 PM Pager   If 7PM-7AM, please contact night-coverage [email or web address] Password [email or web address]

## 2017-02-27 NOTE — Evaluation (Signed)
Physical Therapy Evaluation/ Discharge Patient Details Name: Stephanie Reyes MRN: [id number] DOB: 08-Feb-1924 Today's Date: 02/27/2017   History of Present Illness  83 yo admitted after syncopal episode. PMHx: HTN  Clinical Impression  Pt extremely pleasant, talkative and telling stories throughout. Pt independent and involved in multiple groups at baseline who reports no difficulty with mobility, no dizziness, spinning or precipitating factors to syncope other than a previous incident where she was told her electrolytes were abnormal. Pt currently at baseline functional status without vestibular symptoms. Pt did become tachycardic with gait with HR up to 150. Pt noted need to stop walking and upon return to bed HR back to 125 with sats 96% on RA. No further therapy needs at this time with pt aware and agreeable as pt currently at baseline and made aware of elevated HR.     Follow Up Recommendations No PT follow up    Equipment Recommendations  None recommended by PT    Recommendations for Other Services       Precautions / Restrictions Precautions Precautions: None      Mobility  Bed Mobility Overal bed mobility: Modified Independent                Transfers Overall transfer level: Modified independent                  Ambulation/Gait Ambulation/Gait assistance: Modified independent (Device/Increase time) Ambulation Distance (Feet): 350 Feet Assistive device: None Gait Pattern/deviations: Step-through pattern;Staggering left;Staggering right   Gait velocity interpretation: at or above normal speed for age/gender General Gait Details: pt with slightly unsteady gait with tendency to veer as she walks and at times stops to talk, no overt LOB and pt aware of veering, reports no falls  Stairs            Wheelchair Mobility    Modified Rankin (Stroke Patients Only)       Balance Overall balance assessment: Needs assistance   Sitting balance-Leahy  Scale: Good       Standing balance-Leahy Scale: Good                               Pertinent Vitals/Pain Pain Assessment: No/denies pain    Home Living Family/patient expects to be discharged to:: Private residence Living Arrangements: Alone Available Help at Discharge: Family;Available PRN/intermittently Type of Home: Apartment Home Access: Level entry     Home Layout: One level Home Equipment: None      Prior Function Level of Independence: Independent         Comments: Pt states she has assist for driving and grocery shopping. Is very independent and involved with her 6 kids and community     Hand Dominance        Extremity/Trunk Assessment   Upper Extremity Assessment Upper Extremity Assessment: Overall WFL for tasks assessed    Lower Extremity Assessment Lower Extremity Assessment: Overall WFL for tasks assessed    Cervical / Trunk Assessment Cervical / Trunk Assessment: Other exceptions (rounded shoulders, forward head)  Communication   Communication: HOH  Cognition Arousal/Alertness: Awake/alert Behavior During Therapy: WFL for tasks assessed/performed Overall Cognitive Status: Within Functional Limits for tasks assessed                                        General Comments  Exercises     Assessment/Plan    PT Assessment Patent does not need any further PT services  PT Problem List         PT Treatment Interventions      PT Goals (Current goals can be found in the Care Plan section)  Acute Rehab PT Goals PT Goal Formulation: All assessment and education complete, DC therapy    Frequency     Barriers to discharge        Co-evaluation               End of Session   Activity Tolerance: Patient tolerated treatment well Patient left: in bed;with call bell/phone within reach;with bed alarm set;with family/visitor present Nurse Communication: Mobility status PT Visit Diagnosis: Other  abnormalities of gait and mobility (R26.89)    Time: 8469-6295 PT Time Calculation (min) (ACUTE ONLY): 16 min   Charges:   PT Evaluation $PT Eval Low Complexity: 1 Procedure     PT G Codes:   PT G-Codes **NOT FOR INPATIENT CLASS** Functional Assessment Tool Used: AM-PAC 6 Clicks Basic Mobility Functional Limitation: Mobility: Walking and moving around Mobility: Walking and Moving Around Current Status (M8413): At least 1 percent but less than 20 percent impaired, limited or restricted Mobility: Walking and Moving Around Goal Status [phone]): At least 1 percent but less than 20 percent impaired, limited or restricted Mobility: Walking and Moving Around Discharge Status [phone]): At least 1 percent but less than 20 percent impaired, limited or restricted    Stephanie Reyes, PT [phone]   Stephanie Reyes 02/27/2017, 10:30 AM

## 2017-02-27 NOTE — Care Management Obs Status (Signed)
MEDICARE OBSERVATION STATUS NOTIFICATION   Patient Details  Name: Stephanie Reyes MRN: [id number] Date of Birth: [birth year]   Medicare Observation Status Notification Given:  Yes    Darrold Span, RN 02/27/2017, 3:48 PM

## 2017-02-27 NOTE — Progress Notes (Signed)
Order received to discharge patient.  Telemetry monitor removed and CCMD notified.  PIV access removed.  Discharge instructions, follow up, medications and instructions for their use were discussed with patient. 

## 2017-02-27 NOTE — Progress Notes (Signed)
DAILY PROGRESS NOTE   Patient Name: Stephanie Reyes Date of Encounter: 02/27/2017  Hospital Problem List   Principal Problem:   Syncope and collapse Active Problems:   Hypokalemia   Benign essential HTN   Renal insufficiency      Chief Complaint   Feels better today.  Subjective   Monitor overnight shows irregular tachycardia but significant artifact, but periods of sinus with PAC's and PVC's (sometimes couplets). Plan for echo today. No significant pauses. Echo may help determine if this is a-fib, but may require outpatient monitoring. Of note, she is aspirin allergic.  Objective   Vitals:   02/26/17 2347 02/27/17 0637 02/27/17 1026 02/27/17 1027  BP: 117/89 136/83    Pulse: (!) 122 86 (!) 150 72  Resp: 20 18  18   Temp: 97.7 F (36.5 C) 97.4 F (36.3 C)  98.1 F (36.7 C)  TempSrc: Oral Oral  Oral  SpO2: 97% 99%  97%  Weight: 119 lb 8 oz (54.2 kg)     Height: 5' 5"  (1.651 m)       Intake/Output Summary (Last 24 hours) at 02/27/17 1137 Last data filed at 02/27/17 0900  Gross per 24 hour  Intake            582.5 ml  Output              200 ml  Net            382.5 ml   Filed Weights   02/26/17 1912 02/26/17 2347  Weight: 120 lb (54.4 kg) 119 lb 8 oz (54.2 kg)    Physical Exam   General appearance: alert and no distress Lungs: clear to auscultation bilaterally Heart: regular rate and rhythm, S1, S2 normal, no murmur, click, rub or gallop Extremities: extremities normal, atraumatic, no cyanosis or edema Neurologic: Grossly normal  Inpatient Medications    Scheduled Meds: . amLODipine  5 mg Oral Daily  . enoxaparin (LOVENOX) injection  30 mg Subcutaneous Q24H    Continuous Infusions: . sodium chloride 75 mL/hr at 02/26/17 2350    PRN Meds: acetaminophen **OR** acetaminophen, albuterol, ondansetron **OR** ondansetron (ZOFRAN) IV   Labs   Results for orders placed or performed during the hospital encounter of 02/26/17 (from the past 48  hour(s))  Basic metabolic panel     Status: Abnormal   Collection Time: 02/26/17  7:31 PM  Result Value Ref Range   Sodium 136 135 - 145 mmol/L   Potassium 3.4 (L) 3.5 - 5.1 mmol/L   Chloride 102 101 - 111 mmol/L   CO2 22 22 - 32 mmol/L   Glucose, Bld 118 (H) 65 - 99 mg/dL   BUN 19 6 - 20 mg/dL   Creatinine, Ser 1.05 (H) 0.44 - 1.00 mg/dL   Calcium 10.2 8.9 - 10.3 mg/dL   GFR calc non Af Amer 45 (L) >60 mL/min   GFR calc Af Amer 52 (L) >60 mL/min    Comment: (NOTE) The eGFR has been calculated using the CKD EPI equation. This calculation has not been validated in all clinical situations. eGFR's persistently <60 mL/min signify possible Chronic Kidney Disease.    Anion gap 12 5 - 15  CBC     Status: None   Collection Time: 02/26/17  7:31 PM  Result Value Ref Range   WBC 7.3 4.0 - 10.5 K/uL   RBC 4.20 3.87 - 5.11 MIL/uL   Hemoglobin 13.5 12.0 - 15.0 g/dL   HCT 39.8 36.0 -  46.0 %   MCV 94.8 78.0 - 100.0 fL   MCH 32.1 26.0 - 34.0 pg   MCHC 33.9 30.0 - 36.0 g/dL   RDW 12.3 11.5 - 15.5 %   Platelets 197 150 - 400 K/uL  CBG monitoring, ED     Status: Abnormal   Collection Time: 02/26/17  8:10 PM  Result Value Ref Range   Glucose-Capillary 111 (H) 65 - 99 mg/dL  Urinalysis, Routine w reflex microscopic     Status: Abnormal   Collection Time: 02/26/17  8:46 PM  Result Value Ref Range   Color, Urine STRAW (A) YELLOW   APPearance CLEAR CLEAR   Specific Gravity, Urine 1.009 1.005 - 1.030   pH 7.0 5.0 - 8.0   Glucose, UA NEGATIVE NEGATIVE mg/dL   Hgb urine dipstick NEGATIVE NEGATIVE   Bilirubin Urine NEGATIVE NEGATIVE   Ketones, ur NEGATIVE NEGATIVE mg/dL   Protein, ur NEGATIVE NEGATIVE mg/dL   Nitrite NEGATIVE NEGATIVE   Leukocytes, UA MODERATE (A) NEGATIVE   RBC / HPF 0-5 0 - 5 RBC/hpf   WBC, UA 0-5 0 - 5 WBC/hpf   Bacteria, UA NONE SEEN NONE SEEN   Squamous Epithelial / LPF 0-5 (A) NONE SEEN   Mucous PRESENT    Hyaline Casts, UA PRESENT   I-stat troponin, ED     Status:  None   Collection Time: 02/26/17  8:53 PM  Result Value Ref Range   Troponin i, poc 0.00 0.00 - 0.08 ng/mL   Comment 3            Comment: Due to the release kinetics of cTnI, a negative result within the first hours of the onset of symptoms does not rule out myocardial infarction with certainty. If myocardial infarction is still suspected, repeat the test at appropriate intervals.   Protime-INR     Status: None   Collection Time: 02/26/17  8:54 PM  Result Value Ref Range   Prothrombin Time 13.9 11.4 - 15.2 seconds   INR 1.06   TSH     Status: Abnormal   Collection Time: 02/27/17 12:03 AM  Result Value Ref Range   TSH 6.118 (H) 0.350 - 4.500 uIU/mL    Comment: Performed by a 3rd Generation assay with a functional sensitivity of <=0.01 uIU/mL.  CBC with Differential/Platelet     Status: None   Collection Time: 02/27/17 12:03 AM  Result Value Ref Range   WBC 7.2 4.0 - 10.5 K/uL   RBC 4.33 3.87 - 5.11 MIL/uL   Hemoglobin 14.1 12.0 - 15.0 g/dL   HCT 41.2 36.0 - 46.0 %   MCV 95.2 78.0 - 100.0 fL   MCH 32.6 26.0 - 34.0 pg   MCHC 34.2 30.0 - 36.0 g/dL   RDW 12.2 11.5 - 15.5 %   Platelets 231 150 - 400 K/uL   Neutrophils Relative % 72 %   Neutro Abs 5.2 1.7 - 7.7 K/uL   Lymphocytes Relative 22 %   Lymphs Abs 1.6 0.7 - 4.0 K/uL   Monocytes Relative 6 %   Monocytes Absolute 0.4 0.1 - 1.0 K/uL   Eosinophils Relative 0 %   Eosinophils Absolute 0.0 0.0 - 0.7 K/uL   Basophils Relative 0 %   Basophils Absolute 0.0 0.0 - 0.1 K/uL  Basic metabolic panel     Status: Abnormal   Collection Time: 02/27/17 12:03 AM  Result Value Ref Range   Sodium 135 135 - 145 mmol/L   Potassium 3.6 3.5 - 5.1  mmol/L   Chloride 102 101 - 111 mmol/L   CO2 23 22 - 32 mmol/L   Glucose, Bld 171 (H) 65 - 99 mg/dL   BUN 16 6 - 20 mg/dL   Creatinine, Ser 1.04 (H) 0.44 - 1.00 mg/dL   Calcium 9.8 8.9 - 10.3 mg/dL   GFR calc non Af Amer 46 (L) >60 mL/min   GFR calc Af Amer 53 (L) >60 mL/min    Comment:  (NOTE) The eGFR has been calculated using the CKD EPI equation. This calculation has not been validated in all clinical situations. eGFR's persistently <60 mL/min signify possible Chronic Kidney Disease.    Anion gap 10 5 - 15  Magnesium     Status: Abnormal   Collection Time: 02/27/17 12:03 AM  Result Value Ref Range   Magnesium 1.6 (L) 1.7 - 2.4 mg/dL  Troponin I     Status: None   Collection Time: 02/27/17  2:14 AM  Result Value Ref Range   Troponin I <0.03 <0.03 ng/mL    ECG   Possibly MAT - not clearly sinus tachy or a-fib - Personally Reviewed  Telemetry   Periods of irregularly irregular tachycardia as high as the 150-160's - most c/w a-fib, poor quality telemetry strips - Personally Reviewed  Radiology   Ct Head Wo Contrast  Result Date: 02/26/2017 CLINICAL DATA:  Acute onset of syncope.  Initial encounter. EXAM: CT HEAD WITHOUT CONTRAST TECHNIQUE: Contiguous axial images were obtained from the base of the skull through the vertex without intravenous contrast. COMPARISON:  None. FINDINGS: Brain: No evidence of acute infarction, hemorrhage, hydrocephalus, extra-axial collection or mass lesion/mass effect. Prominence of the ventricles and sulci reflects mild cortical volume loss. Cerebellar atrophy is noted. Scattered periventricular white matter change likely reflects small vessel ischemic microangiopathy. The brainstem and fourth ventricle are within normal limits. The basal ganglia are unremarkable in appearance. The cerebral hemispheres demonstrate grossly normal gray-white differentiation. No mass effect or midline shift is seen. Vascular: No hyperdense vessel or unexpected calcification. Skull: There is no evidence of fracture; visualized osseous structures are unremarkable in appearance. There is suggestion of narrowing of the upper cervical spinal canal to 7 mm in AP dimension. Sinuses/Orbits: The orbits are within normal limits. The paranasal sinuses and mastoid air cells  are well-aerated. Other: No significant soft tissue abnormalities are seen. IMPRESSION: 1. No acute intracranial pathology seen on CT. 2. Mild cortical volume loss and scattered small vessel ischemic microangiopathy. 3. Suggestion of chronic narrowing of the upper cervical spinal canal to 7 mm in AP dimension. Electronically Signed   By: Garald Balding M.D.   On: 02/26/2017 21:47    Cardiac Studies   Echo pending.  Assessment   1. Principal Problem: 2.   Syncope and collapse 3. Active Problems: 4.   Hypokalemia 5.   Benign essential HTN 6.   Renal insufficiency 7.   PAF  Plan   Appears to have a paroxysmal tachycardia which is irregularly irregular, but not clearly a-fib - could be MAT, sinus tachy with PAC's, but concern for a-fib. Would continue to hold lisinopril/HCTZ and start metoprolol tartrate 12.5 mg BID. Decrease amlodipine to 2.5 mg daily tomorrow. Echo pending today. Her CHADSVASC score is 39 (given age, female, history of hypertension). She is aspirin allergic. Would hold on anticoagulation for now and recommend a 1 week outpatient monitor unless the echo clearly reveals a-fib. If a-fib is noted on the monitor, she may be a candidate for Eliquis 2.5 mg  BID (Age >80, Weight <60 kg). Barring any significant abnormalities on the echo, should be ok for d/c home today.  I will arrange follow-up and monitor. Thanks for allowing [place] to participate in the care of this patient.  Time Spent Directly with Patient:  15 minutes  Length of Stay:  LOS: 0 days   Pixie Casino, MD, [place]  Attending Cardiologist  Direct Dial: [phone]  Fax: [phone]  Website:  [email or web address] 02/27/2017, 11:37 AM

## 2017-02-27 NOTE — Progress Notes (Signed)
*  PRELIMINARY RESULTS* Echocardiogram 2D Echocardiogram has been performed.  Stephanie Reyes 02/27/2017, 1:45 PM

## 2017-02-28 ENCOUNTER — Ambulatory Visit (INDEPENDENT_AMBULATORY_CARE_PROVIDER_SITE_OTHER): Payer: Medicare Other

## 2017-02-28 DIAGNOSIS — R55 Syncope and collapse: Secondary | ICD-10-CM | POA: Diagnosis not present

## 2017-02-28 LAB — T3, FREE: T3, Free: 3 pg/mL (ref 2.0–4.4)

## 2017-03-13 ENCOUNTER — Ambulatory Visit (INDEPENDENT_AMBULATORY_CARE_PROVIDER_SITE_OTHER): Payer: Medicare Other | Admitting: Nurse Practitioner

## 2017-03-13 ENCOUNTER — Encounter: Payer: Self-pay | Admitting: Nurse Practitioner

## 2017-03-13 VITALS — BP 174/104 | HR 75 | Ht 65.0 in | Wt 120.6 lb

## 2017-03-13 DIAGNOSIS — R55 Syncope and collapse: Secondary | ICD-10-CM | POA: Diagnosis not present

## 2017-03-13 DIAGNOSIS — I10 Essential (primary) hypertension: Secondary | ICD-10-CM | POA: Diagnosis not present

## 2017-03-13 MED ORDER — AMLODIPINE BESYLATE 5 MG PO TABS: 5.0000 mg | ORAL_TABLET | Freq: Every day | ORAL | 3 refills | Status: DC

## 2017-03-13 NOTE — Patient Instructions (Addendum)
Increase Amlodipine to 5 mg 1 tablet daily     Your physician recommends that you schedule a follow-up appointment with Dr.Hilty  Thursday 04/26/17 at 1:40 pm.

## 2017-03-13 NOTE — Progress Notes (Signed)
Office Visit    Patient Name: Stephanie Reyes Date of Encounter: 03/13/2017  Primary Care Provider:  Shaune Pollack, MD Primary Cardiologist:  C. Hilty, MD   Chief Complaint    83 year old female status post recent syncopal episode who presents for follow-up.  Past Medical History    Past Medical History:  Diagnosis Date  . Essential hypertension   . Premature atrial contractions   . Syncope    a. 02/2017 Echo: EF 55-60%, mild LVH, no rwma, Gr1 DD, triv AI, mildly dil Ao root, mild MR, mod TR, PASP .   Past Surgical History:  Procedure Laterality Date  . HERNIA REPAIR    . TONSILLECTOMY      Allergies  Allergies  Allergen Reactions  . Ampicillin Nausea And Vomiting  . Aspirin Nausea And Vomiting  . Penicillins Nausea And Vomiting and Swelling    History of Present Illness    83 year old female with a history of hypertension. She was recently admitted to the hospital following a sudden syncopal episode standing at her. She was noted to have frequent PACs potentially some multifocal atrial tachycardia. He was initially some concern for atrial fibrillation, but this was never found. Echo showed normal LV function. She was subsequently discharged and placed on a 30 day event monitor. She has only been wearing it for a week. There has been one accidental transmission showing sinus rhythm with PACs and artifact. Since her discharge, she has done well without any recurrence of presyncope or syncope. She denies chest pain, dyspnea, palpitations, PND, orthopnea, edema, or early satiety.  Home Medications    Prior to Admission medications   Medication Sig Start Date End Date Taking? Authorizing Provider  metoprolol tartrate (LOPRESSOR) 25 MG tablet Take 0.5 tablets (12.5 mg total) by mouth 2 (two) times daily. 02/27/17  Yes Dhungel, Nishant, MD  Multiple Vitamin (MULTIVITAMIN WITH MINERALS) TABS tablet Take 1 tablet by mouth every other day.   Yes [provider]    OVER THE COUNTER MEDICATION Take 1 tablet by mouth every other day. For bones   Yes [provider]  amLODipine (NORVASC) 5 MG tablet Take 1 tablet (5 mg total) by mouth daily. 03/13/17 06/11/17  Ok Anis, NP    Review of Systems    As above, doing well. She denies chest pain, palpitations, dyspnea, PND, orthopnea, dizziness, syncope, edema, or early satiety.  All other systems reviewed and are otherwise negative except as noted above.  Physical Exam    VS:  BP (!) 174/104   Pulse 75   Ht 5\' 5"  (1.651 m)   Wt 120 lb 9.6 oz (54.7 kg)   BMI 20.07 kg/m  , BMI Body mass index is 20.07 kg/m. GEN: Well nourished, well developed, in no acute distress.  HEENT: normal.  Neck: Supple, no JVD, carotid bruits, or masses. Cardiac: Irregular, 2/6 systolic murmur at the left lower sternal border, no rubs, or gallops. No clubbing, cyanosis, edema.  Radials/DP/PT 2+ and equal bilaterally.  Respiratory:  Respirations regular and unlabored, clear to auscultation bilaterally. GI: Soft, nontender, nondistended, BS + x 4. MS: no deformity or atrophy. Skin: warm and dry, no rash. Neuro:  Strength and sensation are intact. Psych: Normal affect.  Accessory Clinical Findings    ECG - Sinus rhythm, 68, frequent PACs, left axis deviation, left anterior fascicular block, nonspecific T changes.  Assessment & Plan    1.  Syncope: Status post recent hospitalization following a sudden syncopal episode. Hospital  evaluation revealed normal LV function by echocardiogram. There she had frequent PACs, she did not have any sustained tachycardia or bradycardia arrhythmias. She is wearing a 30 day event monitor at this time. No significant episodes up to this point. No recurrent presyncope or syncope. She'll continue wearing the event monitor and we will see her back in 4-6 weeks. Continue low-dose beta blocker.  2. Essential hypertension: Blood pressure is elevated today at 174/104. I repeated this  and it was 155/90. I will increase her amlodipine from 2.5 to 5 mg daily. She remains on low-dose beta blocker.  3. Disposition: Patient will follow-up with Dr. Rennis GoldenHilty in 4-6 weeks to go over event monitoring.  Nicolasa Duckinghristopher Berge, NP 03/13/2017, 12:48 PM

## 2017-04-05 ENCOUNTER — Encounter: Payer: Self-pay | Admitting: Internal Medicine

## 2017-04-26 ENCOUNTER — Ambulatory Visit (INDEPENDENT_AMBULATORY_CARE_PROVIDER_SITE_OTHER): Payer: Medicare Other | Admitting: Internal Medicine

## 2017-04-26 ENCOUNTER — Encounter: Payer: Self-pay | Admitting: Internal Medicine

## 2017-04-26 VITALS — BP 151/91 | HR 79 | Ht 65.0 in | Wt 120.2 lb

## 2017-04-26 DIAGNOSIS — I10 Essential (primary) hypertension: Secondary | ICD-10-CM | POA: Diagnosis not present

## 2017-04-26 DIAGNOSIS — I712 Thoracic aortic aneurysm, without rupture: Secondary | ICD-10-CM | POA: Diagnosis not present

## 2017-04-26 DIAGNOSIS — I7121 Aneurysm of the ascending aorta, without rupture: Secondary | ICD-10-CM

## 2017-04-26 DIAGNOSIS — Z79899 Other long term (current) drug therapy: Secondary | ICD-10-CM | POA: Diagnosis not present

## 2017-04-26 DIAGNOSIS — R55 Syncope and collapse: Secondary | ICD-10-CM

## 2017-04-26 MED ORDER — CHLORTHALIDONE 25 MG PO TABS: 12.5000 mg | ORAL_TABLET | Freq: Every day | ORAL | 3 refills | Status: DC

## 2017-04-26 MED ORDER — METOPROLOL TARTRATE 25 MG PO TABS: 12.5000 mg | ORAL_TABLET | Freq: Two times a day (BID) | ORAL | 3 refills | Status: DC

## 2017-04-26 NOTE — Progress Notes (Signed)
OFFICE FOLLOW-UP NOTE  Chief Complaint:  Hospital follow-up  Primary Care Physician: Darcus [place], MD  HPI:  Stephanie Reyes is a 83 y.o. female with a past medial history significant for essential hypertension, PACs and a syncopal event. I first met her in the hospital after she suffered a syncopal event was found to have frequent ectopy on her monitor. There was however no evidence for atrial fibrillation. Subsequently we placed an outpatient monitor which showed no evidence of A. fib. She did undergo an echocardiogram which showed mild LVH, EF 60-63%, diastolic dysfunction and moderate pulmonary hypertension. The aortic root was mildly dilated to 4.1 cm. She followed up with [name], NP, who noted she was still hypertensive and increase her amlodipine from 2.5-5 mg daily. She reports intolerance to lisinopril in the past and is also on metoprolol. Blood pressure remains elevated today 151/91 and she reports some lower extremity swelling and shortness of breath with exertion. She denies any further syncopal episodes.  PMHx:  Past Medical History:  Diagnosis Date  . Essential hypertension   . Premature atrial contractions   . Syncope    a. 02/2017 Echo: EF 55-60%, mild LVH, no rwma, Gr1 DD, triv AI, mildly dil Ao root, mild MR, mod TR, PASP 49mHg.    Past Surgical History:  Procedure Laterality Date  . HERNIA REPAIR    . TONSILLECTOMY      FAMHx:  No family history on file.  SOCHx:   reports that she has never smoked. She has never used smokeless tobacco. She reports that she does not drink alcohol or use drugs.  ALLERGIES:  Allergies  Allergen Reactions  . Ampicillin Nausea And Vomiting  . Aspirin Nausea And Vomiting  . Penicillins Nausea And Vomiting and Swelling    ROS: Pertinent items noted in HPI and remainder of comprehensive ROS otherwise negative.  HOME MEDS: Current Outpatient Prescriptions on File Prior to Visit  Medication Sig Dispense Refill  .  amLODipine (NORVASC) 5 MG tablet Take 1 tablet (5 mg total) by mouth daily. 90 tablet 3  . Multiple Vitamin (MULTIVITAMIN WITH MINERALS) TABS tablet Take 1 tablet by mouth every other day.    .Marland KitchenOVER THE COUNTER MEDICATION Take 1 tablet by mouth every other day. For bones     No current facility-administered medications on file prior to visit.     LABS/IMAGING: No results found for this or any previous visit (from the past 48 hour(s)). No results found.  LIPID PANEL: No results found for: CHOL, TRIG, HDL, CHOLHDL, VLDL, LDLCALC, LDLDIRECT   WEIGHTS: Wt Readings from Last 3 Encounters:  04/26/17 120 lb 3.2 oz (54.5 kg)  03/13/17 120 lb 9.6 oz (54.7 kg)  02/26/17 119 lb 8 oz (54.2 kg)    VITALS: BP (!) 151/91   Pulse 79   Ht 5' 5"  (1.651 m)   Wt 120 lb 3.2 oz (54.5 kg)   SpO2 95%   BMI 20.00 kg/m   EXAM: General appearance: alert and no distress Neck: no carotid bruit, no JVD and thyroid not enlarged, symmetric, no tenderness/mass/nodules Lungs: clear to auscultation bilaterally Heart: regular rate and rhythm, S1, S2 normal, no murmur, click, rub or gallop Abdomen: soft, non-tender; bowel sounds normal; no masses,  no organomegaly Extremities: extremities normal, atraumatic, no cyanosis or edema Pulses: 2+ and symmetric Skin: Skin color, texture, turgor normal. No rashes or lesions Neurologic: Grossly normal Psych: Pleasant  EKG: Deferred  ASSESSMENT: 1. Syncope 2. Frequent PACs 3. Dilated  ascending aorta to 4.1 cm 4. Uncontrolled hypertension  PLAN: 1.   Mrs. Osie Bond continues to have uncontrolled hypertension and some shortness of breath with lower extremity swelling. Hypertension is likely playing a role in this. This may also be related to side effects from amlodipine. I recommended adding low-dose chlorthalidone 12.5 mg daily for additional blood pressure and diuretic effects. Will repeat a metabolic profile in one month. She'll need repeat imaging of her aorta  likely in one year to reassess her aortic size.  Follow-up in 6 months.  Pixie Casino, MD, [place]  Attending Cardiologist  Direct Dial: [phone]  Fax: [phone]  Website:  [email or web address] 04/26/2017, 2:34 PM

## 2017-04-26 NOTE — Patient Instructions (Signed)
Your physician has recommended you make the following change in your medication:  -- START chlorthalidone 12.5mg  once daily  Your physician recommends that you return for lab work in ONE WEEK  Your physician wants you to follow-up in: 6 months with Dr. Rennis GoldenHilty. You will receive a reminder letter in the mail two months in advance. If you don't receive a letter, please call our office to schedule the follow-up appointment.

## 2017-04-27 ENCOUNTER — Telehealth: Payer: Self-pay | Admitting: Internal Medicine

## 2017-04-27 NOTE — Telephone Encounter (Signed)
New message    Pt daughter is calling to find out about visit that pt had yesterday with Dr. Rennis GoldenHilty. She wants to ask about a diagnosis that pt states she got yesterday. Please call.

## 2017-04-27 NOTE — Telephone Encounter (Signed)
No obvious dx of pulmonary HTN noted, increased PA pressures on echo.. Please advise.

## 2017-04-27 NOTE — Telephone Encounter (Signed)
S/w daughter,Laura Rice she states that she thought that Dr Rennis GoldenHilty said that pt had Pulmonary HTN I reviewed her last visit office note and problem list and this is not listed there. Daughter notified and she states ok.

## 2017-04-30 NOTE — Telephone Encounter (Signed)
Per Hilty-Nothing further needed

## 2017-05-08 LAB — BASIC METABOLIC PANEL
BUN/Creatinine Ratio: 20 (ref 12–28)
BUN: 19 mg/dL (ref 10–36)
CO2: 26 mmol/L (ref 20–29)
Calcium: 10.6 mg/dL — ABNORMAL HIGH (ref 8.7–10.3)
Chloride: 94 mmol/L — ABNORMAL LOW (ref 96–106)
Creatinine, Ser: 0.96 mg/dL (ref 0.57–1.00)
GFR calc Af Amer: 60 mL/min/{1.73_m2} (ref >59)
GFR calc non Af Amer: 52 mL/min/{1.73_m2} — ABNORMAL LOW (ref >59)
Glucose: 87 mg/dL (ref 65–99)
Potassium: 5.2 mmol/L (ref 3.5–5.2)
Sodium: 136 mmol/L (ref 134–144)

## 2017-11-16 ENCOUNTER — Ambulatory Visit: Payer: Medicare Other | Admitting: Internal Medicine

## 2017-11-16 ENCOUNTER — Encounter: Payer: Self-pay | Admitting: Internal Medicine

## 2017-11-16 VITALS — BP 135/77 | HR 82 | Ht 64.0 in | Wt 124.8 lb

## 2017-11-16 DIAGNOSIS — I712 Thoracic aortic aneurysm, without rupture: Secondary | ICD-10-CM | POA: Diagnosis not present

## 2017-11-16 DIAGNOSIS — I7121 Aneurysm of the ascending aorta, without rupture: Secondary | ICD-10-CM

## 2017-11-16 DIAGNOSIS — R55 Syncope and collapse: Secondary | ICD-10-CM | POA: Diagnosis not present

## 2017-11-16 DIAGNOSIS — I491 Atrial premature depolarization: Secondary | ICD-10-CM | POA: Diagnosis not present

## 2017-11-16 DIAGNOSIS — I10 Essential (primary) hypertension: Secondary | ICD-10-CM

## 2017-11-16 NOTE — Patient Instructions (Signed)
Your physician wants you to follow-up in: 6 months with Dr. Hilty. You will receive a reminder letter in the mail two months in advance. If you don't receive a letter, please call our office to schedule the follow-up appointment.    

## 2017-11-17 ENCOUNTER — Encounter: Payer: Self-pay | Admitting: Internal Medicine

## 2017-11-17 NOTE — Progress Notes (Signed)
OFFICE FOLLOW-UP NOTE  Chief Complaint:  No complaints  Primary Care Physician: Darcus Austin, MD  HPI:  Stephanie Reyes is a 84 y.o. female with a past medial history significant for essential hypertension, PACs and a syncopal event. I first met her in the hospital after she suffered a syncopal event was found to have frequent ectopy on her monitor. There was however no evidence for atrial fibrillation. Subsequently we placed an outpatient monitor which showed no evidence of A. fib. She did undergo an echocardiogram which showed mild LVH, EF 93-57%, diastolic dysfunction and moderate pulmonary hypertension. The aortic root was mildly dilated to 4.1 cm. She followed up with [name], NP, who noted she was still hypertensive and increase her amlodipine from 2.5-5 mg daily. She reports intolerance to lisinopril in the past and is also on metoprolol. Blood pressure remains elevated today 151/91 and she reports some lower extremity swelling and shortness of breath with exertion. She denies any further syncopal episodes.  11/16/2017   Sharde returns today for follow-up. She reports feeling well. Her edema has improved on chlorthalidone, but her right foot still swells some. She has better BP control in general. She is still very active, despite almost turning 89. She denies chest pain or dyspnea.   PMHx:  Past Medical History:  Diagnosis Date  . Essential hypertension   . Premature atrial contractions   . Syncope    a. 02/2017 Echo: EF 55-60%, mild LVH, no rwma, Gr1 DD, triv AI, mildly dil Ao root, mild MR, mod TR, PASP 47mHg.    Past Surgical History:  Procedure Laterality Date  . HERNIA REPAIR    . TONSILLECTOMY      FAMHx:  History reviewed. No pertinent family history.  SOCHx:   reports that  has never smoked. she has never used smokeless tobacco. She reports that she does not drink alcohol or use drugs.  ALLERGIES:  Allergies  Allergen Reactions  . Ampicillin Nausea And  Vomiting  . Aspirin Nausea And Vomiting  . Penicillins Nausea And Vomiting and Swelling    ROS: Pertinent items noted in HPI and remainder of comprehensive ROS otherwise negative.  HOME MEDS: Current Outpatient Medications on File Prior to Visit  Medication Sig Dispense Refill  . metoprolol tartrate (LOPRESSOR) 25 MG tablet Take 0.5 tablets (12.5 mg total) by mouth 2 (two) times daily. 90 tablet 3  . Multiple Vitamin (MULTIVITAMIN WITH MINERALS) TABS tablet Take 1 tablet by mouth every other day.    .Marland KitchenOVER THE COUNTER MEDICATION Take 1 tablet by mouth every other day. For bones    . amLODipine (NORVASC) 5 MG tablet Take 1 tablet (5 mg total) by mouth daily. 90 tablet 3  . chlorthalidone (HYGROTON) 25 MG tablet Take 0.5 tablets (12.5 mg total) by mouth daily. 45 tablet 3   No current facility-administered medications on file prior to visit.     LABS/IMAGING: No results found for this or any previous visit (from the past 48 hour(s)). No results found.  LIPID PANEL: No results found for: CHOL, TRIG, HDL, CHOLHDL, VLDL, LDLCALC, LDLDIRECT   WEIGHTS: Wt Readings from Last 3 Encounters:  11/16/17 124 lb 12.8 oz (56.6 kg)  04/26/17 120 lb 3.2 oz (54.5 kg)  03/13/17 120 lb 9.6 oz (54.7 kg)    VITALS: BP 135/77   Pulse 82   Ht 5' 4"  (1.626 m)   Wt 124 lb 12.8 oz (56.6 kg)   SpO2 99%   BMI 21.42 kg/m  EXAM: General appearance: alert and no distress Neck: no carotid bruit, no JVD and thyroid not enlarged, symmetric, no tenderness/mass/nodules Lungs: clear to auscultation bilaterally Heart: regular rate and rhythm, S1, S2 normal, no murmur, click, rub or gallop Abdomen: soft, non-tender; bowel sounds normal; no masses,  no organomegaly Extremities: extremities normal, atraumatic, no cyanosis or edema Pulses: 2+ and symmetric Skin: Skin color, texture, turgor normal. No rashes or lesions Neurologic: Grossly normal Psych: Pleasant  EKG: Sinus rhythm with PAC's at 82,  non-specific ST and T wave changes - personally reviewed  ASSESSMENT: 1. History of syncope 2. Frequent PACs 3. Dilated ascending aorta to 4.1 cm 4. Hypertension  PLAN: 1.   Mrs. Mergen has done well and had no further syncopal events. She is still having frequent PAC's. Her BP and edema have improved. She will need repeat imaging of her dilated aorta next year.   Follow-up in 6 months.  Pixie Casino, MD, [facility], [place] Director of the [facility] Lipid [facility] &  Cardiovascular Risk [facility] Clinic Diplomate of the American Board of Clinical Lipidology Attending Cardiologist  Direct Dial: [phone]  Fax: [phone]  Website:  [email or web address] 11/17/2017, 12:36 PM

## 2018-02-07 ENCOUNTER — Encounter (HOSPITAL_COMMUNITY): Payer: Self-pay

## 2018-02-07 ENCOUNTER — Other Ambulatory Visit: Payer: Self-pay

## 2018-02-07 ENCOUNTER — Inpatient Hospital Stay (HOSPITAL_COMMUNITY): Payer: Medicare Other

## 2018-02-07 ENCOUNTER — Inpatient Hospital Stay (HOSPITAL_COMMUNITY)
Admission: EM | Admit: 2018-02-07 | Discharge: 2018-02-12 | DRG: 470 | Disposition: A | Discharge status: Skilled Nursing Facility | Payer: Medicare Other | Attending: Internal Medicine | Admitting: Internal Medicine

## 2018-02-07 ENCOUNTER — Emergency Department (HOSPITAL_COMMUNITY): Payer: Medicare Other

## 2018-02-07 DIAGNOSIS — S72002A Fracture of unspecified part of neck of left femur, initial encounter for closed fracture: Secondary | ICD-10-CM | POA: Diagnosis present

## 2018-02-07 DIAGNOSIS — I480 Paroxysmal atrial fibrillation: Secondary | ICD-10-CM | POA: Diagnosis present

## 2018-02-07 DIAGNOSIS — Z886 Allergy status to analgesic agent status: Secondary | ICD-10-CM | POA: Diagnosis not present

## 2018-02-07 DIAGNOSIS — I951 Orthostatic hypotension: Secondary | ICD-10-CM | POA: Diagnosis not present

## 2018-02-07 DIAGNOSIS — Z79899 Other long term (current) drug therapy: Secondary | ICD-10-CM

## 2018-02-07 DIAGNOSIS — Z880 Allergy status to penicillin: Secondary | ICD-10-CM | POA: Diagnosis not present

## 2018-02-07 DIAGNOSIS — Y92009 Unspecified place in unspecified non-institutional (private) residence as the place of occurrence of the external cause: Secondary | ICD-10-CM

## 2018-02-07 DIAGNOSIS — I10 Essential (primary) hypertension: Secondary | ICD-10-CM | POA: Diagnosis present

## 2018-02-07 DIAGNOSIS — I491 Atrial premature depolarization: Secondary | ICD-10-CM | POA: Diagnosis not present

## 2018-02-07 DIAGNOSIS — Z91012 Allergy to eggs: Secondary | ICD-10-CM

## 2018-02-07 DIAGNOSIS — M11262 Other chondrocalcinosis, left knee: Secondary | ICD-10-CM | POA: Diagnosis present

## 2018-02-07 DIAGNOSIS — R739 Hyperglycemia, unspecified: Secondary | ICD-10-CM | POA: Diagnosis present

## 2018-02-07 DIAGNOSIS — Z881 Allergy status to other antibiotic agents status: Secondary | ICD-10-CM

## 2018-02-07 DIAGNOSIS — Z419 Encounter for procedure for purposes other than remedying health state, unspecified: Secondary | ICD-10-CM

## 2018-02-07 DIAGNOSIS — Z66 Do not resuscitate: Secondary | ICD-10-CM | POA: Diagnosis present

## 2018-02-07 DIAGNOSIS — I272 Pulmonary hypertension, unspecified: Secondary | ICD-10-CM | POA: Diagnosis present

## 2018-02-07 DIAGNOSIS — Z09 Encounter for follow-up examination after completed treatment for conditions other than malignant neoplasm: Secondary | ICD-10-CM

## 2018-02-07 DIAGNOSIS — W1830XA Fall on same level, unspecified, initial encounter: Secondary | ICD-10-CM | POA: Diagnosis not present

## 2018-02-07 DIAGNOSIS — I739 Peripheral vascular disease, unspecified: Secondary | ICD-10-CM | POA: Diagnosis present

## 2018-02-07 DIAGNOSIS — S72012A Unspecified intracapsular fracture of left femur, initial encounter for closed fracture: Principal | ICD-10-CM | POA: Diagnosis present

## 2018-02-07 DIAGNOSIS — E876 Hypokalemia: Secondary | ICD-10-CM | POA: Diagnosis present

## 2018-02-07 DIAGNOSIS — S72009A Fracture of unspecified part of neck of unspecified femur, initial encounter for closed fracture: Secondary | ICD-10-CM | POA: Diagnosis present

## 2018-02-07 DIAGNOSIS — M1712 Unilateral primary osteoarthritis, left knee: Secondary | ICD-10-CM | POA: Diagnosis present

## 2018-02-07 LAB — CBC WITH DIFFERENTIAL/PLATELET
Basophils Absolute: 0 10*3/uL (ref 0.0–0.1)
Basophils Relative: 0 %
EOS ABS: 0 10*3/uL (ref 0.0–0.7)
EOS PCT: 0 %
HCT: 41.9 % (ref 36.0–46.0)
HEMOGLOBIN: 14.7 g/dL (ref 12.0–15.0)
LYMPHS ABS: 0.9 10*3/uL (ref 0.7–4.0)
LYMPHS PCT: 7 %
MCH: 33.9 pg (ref 26.0–34.0)
MCHC: 35.1 g/dL (ref 30.0–36.0)
MCV: 96.5 fL (ref 78.0–100.0)
MONOS PCT: 4 %
Monocytes Absolute: 0.5 10*3/uL (ref 0.1–1.0)
NEUTROS PCT: 89 %
Neutro Abs: 11 10*3/uL — ABNORMAL HIGH (ref 1.7–7.7)
Platelets: 206 10*3/uL (ref 150–400)
RBC: 4.34 MIL/uL (ref 3.87–5.11)
RDW: 12.6 % (ref 11.5–15.5)
WBC: 12.5 10*3/uL — ABNORMAL HIGH (ref 4.0–10.5)

## 2018-02-07 LAB — URINALYSIS, ROUTINE W REFLEX MICROSCOPIC
Bilirubin Urine: NEGATIVE
GLUCOSE, UA: NEGATIVE mg/dL
Hgb urine dipstick: NEGATIVE
KETONES UR: NEGATIVE mg/dL
LEUKOCYTES UA: NEGATIVE
Nitrite: NEGATIVE
PH: 8 (ref 5.0–8.0)
Protein, ur: NEGATIVE mg/dL
SPECIFIC GRAVITY, URINE: 1.011 (ref 1.005–1.030)

## 2018-02-07 LAB — PROTIME-INR
INR: 1.03
PROTHROMBIN TIME: 13.4 s (ref 11.4–15.2)

## 2018-02-07 LAB — BASIC METABOLIC PANEL
Anion gap: 12 (ref 5–15)
BUN: 16 mg/dL (ref 6–20)
CHLORIDE: 99 mmol/L — AB (ref 101–111)
CO2: 25 mmol/L (ref 22–32)
Calcium: 10.5 mg/dL — ABNORMAL HIGH (ref 8.9–10.3)
Creatinine, Ser: 0.84 mg/dL (ref 0.44–1.00)
GFR calc Af Amer: >60 mL/min (ref >60)
GFR calc non Af Amer: 59 mL/min — ABNORMAL LOW (ref >60)
Glucose, Bld: 186 mg/dL — ABNORMAL HIGH (ref 65–99)
Potassium: 2.8 mmol/L — ABNORMAL LOW (ref 3.5–5.1)
Sodium: 136 mmol/L (ref 135–145)

## 2018-02-07 MED ORDER — SODIUM CHLORIDE 0.9 % IV SOLN
INTRAVENOUS | Status: AC
  Administered 2018-02-08 (×2): via INTRAVENOUS

## 2018-02-07 MED ORDER — ONDANSETRON HCL 4 MG/2ML IJ SOLN
4.0000 mg | Freq: Once | INTRAMUSCULAR | Status: AC
  Administered 2018-02-07: 4 mg via INTRAVENOUS
  Filled 2018-02-07: qty 2

## 2018-02-07 MED ORDER — MORPHINE SULFATE (PF) 4 MG/ML IV SOLN
4.0000 mg | Freq: Once | INTRAVENOUS | Status: AC
  Administered 2018-02-07: 4 mg via INTRAVENOUS
  Filled 2018-02-07: qty 1

## 2018-02-07 MED ORDER — ACETAMINOPHEN 650 MG RE SUPP: 650.0000 mg | Freq: Four times a day (QID) | RECTAL | Status: DC | PRN

## 2018-02-07 MED ORDER — ACETAMINOPHEN 325 MG PO TABS
650.0000 mg | ORAL_TABLET | Freq: Four times a day (QID) | ORAL | Status: DC | PRN
  Administered 2018-02-08 – 2018-02-11 (×3): 650 mg via ORAL
  Filled 2018-02-07 (×3): qty 2

## 2018-02-07 MED ORDER — ENOXAPARIN SODIUM 40 MG/0.4ML ~~LOC~~ SOLN
40.0000 mg | Freq: Every day | SUBCUTANEOUS | Status: DC
  Administered 2018-02-08: 40 mg via SUBCUTANEOUS
  Filled 2018-02-07 (×2): qty 0.4

## 2018-02-07 MED ORDER — METOPROLOL TARTRATE 12.5 MG HALF TABLET
12.5000 mg | ORAL_TABLET | Freq: Two times a day (BID) | ORAL | Status: DC
  Administered 2018-02-08 – 2018-02-09 (×3): 12.5 mg via ORAL
  Filled 2018-02-07 (×3): qty 1

## 2018-02-07 MED ORDER — CHLORTHALIDONE 25 MG PO TABS
12.5000 mg | ORAL_TABLET | Freq: Every day | ORAL | Status: DC
  Filled 2018-02-07: qty 0.5

## 2018-02-07 MED ORDER — POTASSIUM CHLORIDE CRYS ER 20 MEQ PO TBCR
40.0000 meq | EXTENDED_RELEASE_TABLET | Freq: Once | ORAL | Status: DC
  Filled 2018-02-07 (×2): qty 2

## 2018-02-07 MED ORDER — AMLODIPINE BESYLATE 5 MG PO TABS: 5.0000 mg | ORAL_TABLET | Freq: Every day | ORAL | Status: DC

## 2018-02-07 MED ORDER — HYDROMORPHONE HCL 1 MG/ML IJ SOLN
0.5000 mg | INTRAMUSCULAR | Status: DC | PRN
  Administered 2018-02-08 – 2018-02-12 (×9): 0.5 mg via INTRAVENOUS
  Filled 2018-02-07 (×9): qty 1

## 2018-02-07 MED ORDER — POTASSIUM CHLORIDE 10 MEQ/100ML IV SOLN
10.0000 meq | INTRAVENOUS | Status: AC
  Administered 2018-02-07 (×2): 10 meq via INTRAVENOUS
  Filled 2018-02-07 (×2): qty 100

## 2018-02-07 MED ORDER — FENTANYL CITRATE (PF) 100 MCG/2ML IJ SOLN
50.0000 ug | Freq: Once | INTRAMUSCULAR | Status: AC
  Administered 2018-02-07: 50 ug via INTRAVENOUS
  Filled 2018-02-07: qty 2

## 2018-02-07 MED ORDER — ONDANSETRON HCL 4 MG/2ML IJ SOLN: 4.0000 mg | Freq: Four times a day (QID) | INTRAMUSCULAR | Status: DC | PRN

## 2018-02-07 NOTE — ED Triage Notes (Signed)
Pt BIB [facility] from her apartment Administrator, arts([place]- [address] She reports that she was feeling dizzy today and missed the chair when she was trying to sit down. She is complaining of hitting her L elbow and and pain in her L upper leg. No shortening or rotation. She has not walked since falling, but did crawl to her phone to call her neighbor. She is HOH.

## 2018-02-07 NOTE — ED Notes (Signed)
Bed: [facility]B Expected date:  Expected time:  Means of arrival:  Comments: EMS fall 

## 2018-02-07 NOTE — H&P (Addendum)
TRH H&P   Patient Demographics:    Stephanie Reyes, is a 84 y.o. female  MRN: [id number]   DOB - Aug 14, 1924  Admit Date - 02/07/2018  Outpatient Primary MD for the patient is Shaune Pollack, MD  Referring MD/NP/PA: Dr. Clarene Duke  Outpatient Specialists:   Patient coming from: home  Chief Complaint  Patient presents with  . Fall      HPI:    Stephanie Reyes  is a 84 y.o. female, [name], [name]  [name]   In ED,  Left Tib/Fib IMPRESSION: 1. No acute fracture or dislocation identified. 2. Osteoarthrosis of the knee with chondrocalcinosis.  Left hip  IMPRESSION: 1. Acute, impacted fracture of the left femoral neck.  K 2.8, Bun 16, Creatinine 0.84 Wbc 12.5, hgb 14.7, Plt 206 INR 1.03  Orthopedics consulted by ED, and will be by in Am.  (requested admission to [facility])  Pt will be admitted for hypokalemia, hyperglycemia, and left femoral neck fracture.       Review of systems:    In addition to the HPI above, No Fever-chills, No Headache, No changes with Vision or hearing, No problems swallowing food or Liquids, No Chest pain, Cough or Shortness of Breath, No Abdominal pain, No Nausea or Vommitting, Bowel movements are regular, No Blood in stool or Urine, No dysuria, No new skin rashes or bruises,  No new weakness, tingling, numbness in any extremity, No recent weight gain or loss, No polyuria, polydypsia or polyphagia, No significant Mental Stressors.  A full 10 point Review of Systems was done, except as stated above, all other Review of Systems were negative.   With Past History of the following :    Past Medical History:  Diagnosis Date  . Essential hypertension   . Premature atrial contractions   . Syncope    a. 02/2017 Echo: EF 55-60%, mild LVH, no rwma, Gr1  DD, triv AI, mildly dil Ao root, mild MR, mod TR, PASP .      Past Surgical History:  Procedure Laterality Date  . HERNIA REPAIR    . TONSILLECTOMY        Social History:     Social History   Tobacco Use  . Smoking status: Never Smoker  . Smokeless tobacco: Never Used  Substance Use Topics  . Alcohol use: No     Lives - at home  Mobility - walks by self   Family History :    History reviewed. No pertinent family history. Negative for osteoporosis    Home Medications:   Prior to Admission medications   Medication Sig Start Date End Date Taking? Authorizing Provider  amLODipine (NORVASC) 5 MG tablet Take 1 tablet (5 mg total) by mouth daily. 03/13/17 02/07/18 Yes Creig Hines, NP  chlorthalidone (HYGROTON) 25 MG tablet Take  0.5 tablets (12.5 mg total) by mouth daily. 04/26/17 02/07/18 Yes Hilty, Lisette Abu, MD  metoprolol tartrate (LOPRESSOR) 25 MG tablet Take 0.5 tablets (12.5 mg total) by mouth 2 (two) times daily. 04/26/17  Yes Hilty, Lisette Abu, MD  Multiple Vitamin (MULTIVITAMIN WITH MINERALS) TABS tablet Take 1 tablet by mouth every other day.   Yes [provider]  OVER THE COUNTER MEDICATION Take 1 tablet by mouth every other day. For bones   Yes [provider]     Allergies:     Allergies  Allergen Reactions  . Ampicillin Nausea And Vomiting  . Aspirin Nausea And Vomiting  . Eggs Or Egg-Derived Products     Egg whites Reports not being able to use albumin  . Penicillins Nausea And Vomiting and Swelling     Physical Exam:   Vitals  Blood pressure 109/69, pulse 81, temperature 97.7 F (36.5 C), temperature source Oral, resp. rate 18, SpO2 97 %.   1. General  lying in bed in NAD,    2. Normal affect and insight, Not Suicidal or Homicidal, Awake Alert, Oriented X 3.  3. No F.N deficits, ALL C.Nerves Intact, Strength 5/5 all 4 extremities, Sensation intact all 4 extremities, Plantars down going.  4. Ears and Eyes  appear Normal, Conjunctivae clear, PERRLA. Moist Oral Mucosa.  5. Supple Neck, No JVD, No cervical lymphadenopathy appriciated, No Carotid Bruits.  6. Symmetrical Chest wall movement, Good air movement bilaterally, CTAB.  7. RRR, No Gallops, Rubs or Murmurs, No Parasternal Heave.  8. Positive Bowel Sounds, Abdomen Soft, No tenderness, No organomegaly appriciated,No rebound -guarding or rigidity.  9.  No Cyanosis, Normal Skin Turgor, No Skin Rash or Bruise.  10. Good muscle tone,  joints appear normal , no effusions, Normal ROM.  11. No Palpable Lymph Nodes in Neck or Axillae     Data Review:    CBC Recent Labs  Lab 02/07/18 1955  WBC 12.5*  HGB 14.7  HCT 41.9  PLT 206  MCV 96.5  MCH 33.9  MCHC 35.1  RDW 12.6  LYMPHSABS 0.9  MONOABS 0.5  EOSABS 0.0  BASOSABS 0.0   ------------------------------------------------------------------------------------------------------------------  Chemistries  Recent Labs  Lab 02/07/18 1955  NA 136  K 2.8*  CL 99*  CO2 25  GLUCOSE 186*  BUN 16  CREATININE 0.84  CALCIUM 10.5*   ------------------------------------------------------------------------------------------------------------------ CrCl cannot be calculated (Unknown ideal weight.). ------------------------------------------------------------------------------------------------------------------ No results for input(s): TSH, T4TOTAL, T3FREE, THYROIDAB in the last 72 hours.  Invalid input(s): FREET3  Coagulation profile Recent Labs  Lab 02/07/18 1955  INR 1.03   ------------------------------------------------------------------------------------------------------------------- No results for input(s): DDIMER in the last 72 hours. -------------------------------------------------------------------------------------------------------------------  Cardiac Enzymes No results for input(s): CKMB, TROPONINI, MYOGLOBIN in the last 168 hours.  Invalid input(s):  CK ------------------------------------------------------------------------------------------------------------------ No results found for: BNP   ---------------------------------------------------------------------------------------------------------------  Urinalysis    Component Value Date/Time   COLORURINE STRAW (A) 02/26/2017 2046   APPEARANCEUR CLEAR 02/26/2017 2046   LABSPEC 1.009 02/26/2017 2046   PHURINE 7.0 02/26/2017 2046   GLUCOSEU NEGATIVE 02/26/2017 2046   HGBUR NEGATIVE 02/26/2017 2046   BILIRUBINUR NEGATIVE 02/26/2017 2046   KETONESUR NEGATIVE 02/26/2017 2046   PROTEINUR NEGATIVE 02/26/2017 2046   NITRITE NEGATIVE 02/26/2017 2046   LEUKOCYTESUR MODERATE (A) 02/26/2017 2046    ----------------------------------------------------------------------------------------------------------------   Imaging Results:    Dg Tibia/fibula Left  Result Date: 02/07/2018 CLINICAL DATA:  84 y/o  F; fall with proximal lower leg pain. EXAM: LEFT TIBIA AND FIBULA - 2  VIEW COMPARISON:  None. FINDINGS: Mild narrowing of the femorotibial compartments of the knee. Chondrocalcinosis of knee joint. No acute fracture or dislocation identified. Vascular calcifications and soft tissues. IMPRESSION: 1. No acute fracture or dislocation identified. 2. Osteoarthrosis of the knee with chondrocalcinosis. Electronically Signed   By: Mitzi Hansen M.D.   On: 02/07/2018 18:06   Dg Hip Unilat With Pelvis 2-3 Views Left  Result Date: 02/07/2018 CLINICAL DATA:  Left hip pain after fall. EXAM: DG HIP (WITH OR WITHOUT PELVIS) 2-3V LEFT COMPARISON:  None. FINDINGS: Acute, impacted fracture of the left femoral neck. No dislocation. Mild bilateral hip osteoarthritis. The pubic symphysis and sacroiliac joints are intact. Osteopenia. Soft tissues are unremarkable. IMPRESSION: 1. Acute, impacted fracture of the left femoral neck. Electronically Signed   By: Obie Dredge M.D.   On: 02/07/2018 18:05        Assessment & Plan:    Principal Problem:   Left displaced femoral neck fracture (HCC) Active Problems:   Hypokalemia   AF (paroxysmal atrial fibrillation) (HCC)    Left femoral neck fracture NPO after [facility] Orthopedic consult called by ED, appreciate input Dilaudid 0.5mg  iv q3h prn   Hypokalemia Replete Check cmp in am  Pafib Cont Metoprolol  Hypertension Cont Amlodipine  poq day Cont Chlorthalidone  1/2  Po qday  DVT Prophylaxis Lovenox - SCDs   AM Labs Ordered, also please review Full Orders  Family Communication: Admission, patients condition and plan of care including tests being ordered have been discussed with the patient who indicate understanding and agree with the plan and Code Status.  Code Status DNR  Likely DC to  home  Condition GUARDED   Consults called:  home  Admission status: inpatient  Time spent in minutes : 45   Pearson Grippe M.D on 02/07/2018 at 8:44 PM  Between 7am to 7pm - Pager - [phone]  . After 7pm go to [email or web address] - password [facility]  [facility] - Office  [phone]

## 2018-02-07 NOTE — ED Provider Notes (Signed)
[facility]-EMERGENCY DEPT Provider Note   CSN: [id number] Arrival date & time: 02/07/18  1636     History   Chief Complaint Chief Complaint  Patient presents with  . Fall    HPI Stephanie Reyes is a 84 y.o. female.  86yo F w/ PMH including HTN, PACs, paroxysmal A fib who p/w fall.  Just prior to arrival, the patient was at her home when she felt unsteady on her feet and went to sit down when she missed the chair and fell onto her left side. She grazed her head but did not hit it hard and didn't lose consciousness.  No vomiting or vision changes since the fall.  She initially tried to stand up and had pain in her left upper leg/hip and was not able to stand.  She was able to crawl and call her neighbor and called EMS.  Her pain is a dull, mild pain at rest which is worse with movements.  No other extremity injuries. No anticoagulant use.  The history is provided by the patient.  Fall     Past Medical History:  Diagnosis Date  . Essential hypertension   . Premature atrial contractions   . Syncope    a. 02/2017 Echo: EF 55-60%, mild LVH, no rwma, Gr1 DD, triv AI, mildly dil Ao root, mild MR, mod TR, PASP .    Patient Active Problem List   Diagnosis Date Noted  . Ascending aortic aneurysm (HCC) 04/26/2017  . Hypokalemia 02/27/2017  . Benign essential HTN 02/27/2017  . Renal insufficiency 02/27/2017  . AF (paroxysmal atrial fibrillation) (HCC) 02/27/2017  . Sinus tachycardia 02/27/2017  . Syncope and collapse 02/26/2017    Past Surgical History:  Procedure Laterality Date  . HERNIA REPAIR    . TONSILLECTOMY       OB History   None      Home Medications    Prior to Admission medications   Medication Sig Start Date End Date Taking? Authorizing Provider  amLODipine (NORVASC) 5 MG tablet Take 1 tablet (5 mg total) by mouth daily. 03/13/17 06/11/17  Creig Hines, NP  chlorthalidone (HYGROTON) 25 MG tablet Take 0.5 tablets (12.5  mg total) by mouth daily. 04/26/17 07/25/17  Chrystie Nose, MD  metoprolol tartrate (LOPRESSOR) 25 MG tablet Take 0.5 tablets (12.5 mg total) by mouth 2 (two) times daily. 04/26/17   Hilty, Lisette Abu, MD  Multiple Vitamin (MULTIVITAMIN WITH MINERALS) TABS tablet Take 1 tablet by mouth every other day.    [provider]  OVER THE COUNTER MEDICATION Take 1 tablet by mouth every other day. For bones    [provider]    Family History History reviewed. No pertinent family history.  Social History Social History   Tobacco Use  . Smoking status: Never Smoker  . Smokeless tobacco: Never Used  Substance Use Topics  . Alcohol use: No  . Drug use: No     Allergies   Ampicillin; Aspirin; and Penicillins   Review of Systems Review of Systems All other systems reviewed and are negative except that which was mentioned in HPI   Physical Exam Updated Vital Signs BP 119/76 (BP Location: Right Arm)   Pulse 81   Temp 97.7 F (36.5 C) (Oral)   Resp 16   SpO2 95%   Physical Exam  Constitutional: She is oriented to person, place, and time. She appears well-developed and well-nourished. No distress.  HENT:  Head: Normocephalic and atraumatic.  Moist  mucous membranes  Eyes: Pupils are equal, round, and reactive to light. Conjunctivae are normal.  Neck: Neck supple.  Cardiovascular: Normal rate, regular rhythm, normal heart sounds and intact distal pulses.  No murmur heard. Pulmonary/Chest: Effort normal and breath sounds normal. She exhibits no tenderness.  Abdominal: Soft. Bowel sounds are normal. She exhibits no distension. There is no tenderness.  Musculoskeletal: She exhibits no edema or deformity.  Tenderness of L posterior hip with limited flexion 2/2 pain; normal ROM at L ankle and knee, mild tenderness of proximal L tib/fib without swelling  Neurological: She is alert and oriented to person, place, and time. No sensory deficit.  Fluent speech  Skin: Skin is  warm and dry.  Psychiatric: She has a normal mood and affect. Judgment normal.  Nursing note and vitals reviewed.    ED Treatments / Results  Labs (all labs ordered are listed, but only abnormal results are displayed) Labs Reviewed  BASIC METABOLIC PANEL - Abnormal; Notable for the following components:      Result Value   Potassium 2.8 (*)    Chloride 99 (*)    Glucose, Bld 186 (*)    Calcium 10.5 (*)    GFR calc non Af Amer 59 (*)    All other components within normal limits  CBC WITH DIFFERENTIAL/PLATELET - Abnormal; Notable for the following components:   WBC 12.5 (*)    Neutro Abs 11.0 (*)    All other components within normal limits  URINALYSIS, ROUTINE W REFLEX MICROSCOPIC - Abnormal; Notable for the following components:   APPearance CLOUDY (*)    All other components within normal limits  PROTIME-INR  MAGNESIUM    EKG None  Radiology Dg Tibia/fibula Left  Result Date: 02/07/2018 CLINICAL DATA:  84 y/o  F; fall with proximal lower leg pain. EXAM: LEFT TIBIA AND FIBULA - 2 VIEW COMPARISON:  None. FINDINGS: Mild narrowing of the femorotibial compartments of the knee. Chondrocalcinosis of knee joint. No acute fracture or dislocation identified. Vascular calcifications and soft tissues. IMPRESSION: 1. No acute fracture or dislocation identified. 2. Osteoarthrosis of the knee with chondrocalcinosis. Electronically Signed   By: Mitzi HansenLance  Furusawa-Stratton M.D.   On: 02/07/2018 18:06   Dg Hip Unilat With Pelvis 2-3 Views Left  Result Date: 02/07/2018 CLINICAL DATA:  Left hip pain after fall. EXAM: DG HIP (WITH OR WITHOUT PELVIS) 2-3V LEFT COMPARISON:  None. FINDINGS: Acute, impacted fracture of the left femoral neck. No dislocation. Mild bilateral hip osteoarthritis. The pubic symphysis and sacroiliac joints are intact. Osteopenia. Soft tissues are unremarkable. IMPRESSION: 1. Acute, impacted fracture of the left femoral neck. Electronically Signed   By: Obie DredgeWilliam T Derry M.D.   On:  02/07/2018 18:05    Procedures Procedures (including critical care time)  Medications Ordered in ED Medications - No data to display   Initial Impression / Assessment and Plan / ED Course  I have reviewed the triage vital signs and the nursing notes.  Pertinent  imaging results that were available during my care of the patient were reviewed by me and considered in my medical decision making (see chart for details).     Well appearing on exam. No obvious limb shortening or rotation.  Gave the patient fentanyl and Zofran.  XR show acute, impacted fracture of left femoral neck.  Contacted orthopedics and discussed with Dr. Everardo PacificVarkey, who [facility] and hopefully operative repair tomorrow.  Her labs were notable for potassium of 2.8, gave  oral and IV repletion.  Discussed admission with [facility] hospitalist, Dr. Selena Batten.  Final Clinical Impressions(s) / ED Diagnoses   Final diagnoses:  None    ED Discharge Orders    None       Little, Ambrose Finland, MD 02/07/18 2315

## 2018-02-08 LAB — CBC
HEMATOCRIT: 38.4 % (ref 36.0–46.0)
Hemoglobin: 13.3 g/dL (ref 12.0–15.0)
MCH: 33.8 pg (ref 26.0–34.0)
MCHC: 34.6 g/dL (ref 30.0–36.0)
MCV: 97.5 fL (ref 78.0–100.0)
PLATELETS: 173 10*3/uL (ref 150–400)
RBC: 3.94 MIL/uL (ref 3.87–5.11)
RDW: 12.7 % (ref 11.5–15.5)
WBC: 8.6 10*3/uL (ref 4.0–10.5)

## 2018-02-08 LAB — COMPREHENSIVE METABOLIC PANEL
ALBUMIN: 3.3 g/dL — AB (ref 3.5–5.0)
ALT: 15 U/L (ref 14–54)
AST: 24 U/L (ref 15–41)
Alkaline Phosphatase: 46 U/L (ref 38–126)
Anion gap: 8 (ref 5–15)
BUN: 16 mg/dL (ref 6–20)
CHLORIDE: 100 mmol/L — AB (ref 101–111)
CO2: 27 mmol/L (ref 22–32)
CREATININE: 0.83 mg/dL (ref 0.44–1.00)
Calcium: 9.3 mg/dL (ref 8.9–10.3)
GFR calc Af Amer: >60 mL/min (ref >60)
GFR calc non Af Amer: 59 mL/min — ABNORMAL LOW (ref >60)
GLUCOSE: 143 mg/dL — AB (ref 65–99)
POTASSIUM: 3.5 mmol/L (ref 3.5–5.1)
Sodium: 135 mmol/L (ref 135–145)
Total Bilirubin: 0.9 mg/dL (ref 0.3–1.2)
Total Protein: 6 g/dL — ABNORMAL LOW (ref 6.5–8.1)

## 2018-02-08 LAB — MAGNESIUM
Magnesium: 1.5 mg/dL — ABNORMAL LOW (ref 1.7–2.4)
Magnesium: 1.4 mg/dL — ABNORMAL LOW (ref 1.7–2.4)

## 2018-02-08 MED ORDER — VANCOMYCIN HCL IN DEXTROSE 1-5 GM/200ML-% IV SOLN
1000.0000 mg | INTRAVENOUS | Status: AC
  Administered 2018-02-09: 1000 mg via INTRAVENOUS
  Filled 2018-02-08 (×2): qty 200

## 2018-02-08 MED ORDER — CEFAZOLIN SODIUM-DEXTROSE 2-4 GM/100ML-% IV SOLN
2.0000 g | INTRAVENOUS | Status: AC
  Administered 2018-02-09: 2 g via INTRAVENOUS
  Filled 2018-02-08: qty 100

## 2018-02-08 MED ORDER — ACETAMINOPHEN 10 MG/ML IV SOLN
1000.0000 mg | INTRAVENOUS | Status: AC
  Administered 2018-02-09: 1000 mg via INTRAVENOUS
  Filled 2018-02-08: qty 100

## 2018-02-08 MED ORDER — TRANEXAMIC ACID 1000 MG/10ML IV SOLN
1000.0000 mg | INTRAVENOUS | Status: AC
  Administered 2018-02-09: 1000 mg via INTRAVENOUS
  Filled 2018-02-08 (×2): qty 10

## 2018-02-08 MED ORDER — POVIDONE-IODINE 10 % EX SWAB: 2.0000 "application " | Freq: Once | CUTANEOUS | Status: DC

## 2018-02-08 MED ORDER — CHLORHEXIDINE GLUCONATE 4 % EX LIQD: 60.0000 mL | Freq: Once | CUTANEOUS | Status: DC

## 2018-02-08 MED ORDER — CHLORHEXIDINE GLUCONATE 4 % EX LIQD
60.0000 mL | Freq: Once | CUTANEOUS | Status: DC
  Filled 2018-02-08: qty 15

## 2018-02-08 MED ORDER — MAGNESIUM SULFATE 2 GM/50ML IV SOLN
2.0000 g | Freq: Once | INTRAVENOUS | Status: AC
  Administered 2018-02-08: 2 g via INTRAVENOUS
  Filled 2018-02-08: qty 50

## 2018-02-08 NOTE — Progress Notes (Signed)
OT Cancellation Note  Patient Details Name: Stephanie AverCorinne L Reyes MRN: [id number] DOB: [birth year]   Cancelled Treatment:    Reason Eval/Treat Not Completed: Other (comment).  Pt has not had sx. Please order after this.  Thank you.  Khyan Oats 02/08/2018, 3:34 PM  Marica OtterMaryellen Cadynce Garrette, OTR/L [phone] 02/08/2018

## 2018-02-08 NOTE — ED Notes (Signed)
Can go by [facility] straight to short stay, per surgeon

## 2018-02-08 NOTE — ED Notes (Signed)
Patient's O2 sat on 2L O2 was 90%. This RN increased patient's oxygen to 3L and O2 sat is now 96%. [facility].

## 2018-02-08 NOTE — ED Notes (Signed)
Patients son Vincente LibertyKen Edgerton [phone]

## 2018-02-08 NOTE — ED Notes (Signed)
[facility] has been contacted regarding patient transport.  

## 2018-02-08 NOTE — ED Notes (Signed)
[facility]. Nurse unavailable at this time.

## 2018-02-08 NOTE — Consult Note (Signed)
Made in error, consult note by Dr. Kathline MagicSwinteck's team

## 2018-02-08 NOTE — Progress Notes (Signed)
PROGRESS NOTE    Stephanie Reyes  ZOX:[id number] DOB: 1924/10/09 DOA: 02/07/2018 PCP: Shaune Pollack, MD   Brief Narrative: Patient is a 84 year old female from retirement community with past medical history of hypertension, paroxysmal A. fib who was brought to the emergency department after she had a mechanical fall at home .Patient states she was attempted to sit from one chair to another then her feet got tangled and she fell on the ground  hurting her hip on the left.  Imagings done in the emergency department showed impacted fracture of left femoral neck.  Orthopedics consulted.  Assessment & Plan:   Principal Problem:   Left displaced femoral neck fracture (HCC) Active Problems:   Hypokalemia   AF (paroxysmal atrial fibrillation) (HCC)   Femoral neck fracture (HCC)  Left femoral neck fracture: Currently n.p.o.  Orthopedics following and planning for left hemiarthroplasty.  PT/OT will be ordered after the procedure. Continue pain management. Patient lives by herself and ambulates without the help of walker or cane on baseline.  Hypokalemia: Supplemented and corrected.  Hypomagnesemia: Supplemented ,we will check the levels tomorrow.  Paroxysmal A. fib: Currently rate is controlled.  On metoprolol.  Not on anticoagulation at home   Hypertension: Currently blood pressure slightly on the lower side.  We will continue to monitor blood pressure.  Held amlodipine and chlorthalidone   DVT prophylaxis: Lovenox Code Status: Full Family Communication: None present at the bedside Disposition Plan: Likely SNIF after PT evaluation   Consultants: Orthopedics  Procedures: None  Antimicrobials: None  Subjective: Patient is a very pleasant elderly female and was seen in the emergency department.  Says her pain is better controlled with pain medication.  She was asking when is her  surgery and when she can eat.  Reported to have dry mouth.  Objective: Vitals:   02/08/18 0900  02/08/18 1000 02/08/18 1200 02/08/18 1300  BP: 98/67 102/67 (!) 81/68 96/64  Pulse: 65 72 68 74  Resp: 16 20 (!) 22 (!) 22  Temp:      TempSrc:      SpO2: (!) 86% (!) 89% 94% 94%   No intake or output data in the 24 hours ending 02/08/18 1353 There were no vitals filed for this visit.  Examination:  General exam: Appears calm and comfortable ,Not in distress,average built HEENT:PERRL,Oral mucosa dry, Ear/Nose normal on gross exam Respiratory system: Bilateral equal air entry, normal vesicular breath sounds, no wheezes or crackles  Cardiovascular system: S1 & S2 heard, RRR. No JVD, murmurs, rubs, gallops or clicks. No pedal edema. Gastrointestinal system: Abdomen is nondistended, soft and nontender. No organomegaly or masses felt. Normal bowel sounds heard. Central nervous system: Alert and oriented. No focal neurological deficits. Extremities: No edema, no clubbing ,no cyanosis, distal peripheral pulses palpable. Tenderness on the left hip, no obvious deformity Skin: No rashes, lesions or ulcers,no icterus ,no pallor MSK: Normal muscle bulk,tone ,power Psychiatry: Judgement and insight appear normal. Mood & affect appropriate.     Data Reviewed: I have personally reviewed following labs and imaging studies  CBC: Recent Labs  Lab 02/07/18 1955 02/08/18 0514  WBC 12.5* 8.6  NEUTROABS 11.0*  --   HGB 14.7 13.3  HCT 41.9 38.4  MCV 96.5 97.5  PLT 206 173   Basic Metabolic Panel: Recent Labs  Lab 02/07/18 1955 02/07/18 2036 02/08/18 0514  NA 136  --  135  K 2.8*  --  3.5  CL 99*  --  100*  CO2 25  --  27  GLUCOSE 186*  --  143*  BUN 16  --  16  CREATININE 0.84  --  0.83  CALCIUM 10.5*  --  9.3  MG  --  1.5* 1.4*   GFR: CrCl cannot be calculated (Unknown ideal weight.). Liver Function Tests: Recent Labs  Lab 02/08/18 0514  AST 24  ALT 15  ALKPHOS 46  BILITOT 0.9  PROT 6.0*  ALBUMIN 3.3*   No results for input(s): LIPASE, AMYLASE in the last 168  hours. No results for input(s): AMMONIA in the last 168 hours. Coagulation Profile: Recent Labs  Lab 02/07/18 1955  INR 1.03   Cardiac Enzymes: No results for input(s): CKTOTAL, CKMB, CKMBINDEX, TROPONINI in the last 168 hours. BNP (last 3 results) No results for input(s): PROBNP in the last 8760 hours. HbA1C: No results for input(s): HGBA1C in the last 72 hours. CBG: No results for input(s): GLUCAP in the last 168 hours. Lipid Profile: No results for input(s): CHOL, HDL, LDLCALC, TRIG, CHOLHDL, LDLDIRECT in the last 72 hours. Thyroid Function Tests: No results for input(s): TSH, T4TOTAL, FREET4, T3FREE, THYROIDAB in the last 72 hours. Anemia Panel: No results for input(s): VITAMINB12, FOLATE, FERRITIN, TIBC, IRON, RETICCTPCT in the last 72 hours. Sepsis Labs: No results for input(s): PROCALCITON, LATICACIDVEN in the last 168 hours.  No results found for this or any previous visit (from the past 240 hour(s)).       Radiology Studies: Dg Tibia/fibula Left  Result Date: 02/07/2018 CLINICAL DATA:  84 y/o  F; fall with proximal lower leg pain. EXAM: LEFT TIBIA AND FIBULA - 2 VIEW COMPARISON:  None. FINDINGS: Mild narrowing of the femorotibial compartments of the knee. Chondrocalcinosis of knee joint. No acute fracture or dislocation identified. Vascular calcifications and soft tissues. IMPRESSION: 1. No acute fracture or dislocation identified. 2. Osteoarthrosis of the knee with chondrocalcinosis. Electronically Signed   By: Mitzi HansenLance  Furusawa-Stratton M.D.   On: 02/07/2018 18:06   Ct Hip Left Wo Contrast  Result Date: 02/07/2018 CLINICAL DATA:  Left hip pain after a fall today. Fracture demonstrated on plain radiographs. EXAM: CT OF THE LEFT HIP WITHOUT CONTRAST TECHNIQUE: Multidetector CT imaging of the left hip was performed according to the standard protocol. Multiplanar CT image reconstructions were also generated. COMPARISON:  Left hip 02/07/2018 FINDINGS: Bones/Joint/Cartilage  Mostly transverse impacted fracture of the subcapital left femoral neck. There is valgus angulation of the left hip. No dislocation. Tiny displaced comminuted fragments are demonstrated. No evidence of inter trochanteric involvement. Visualize acetabulum appears intact. Old appearing deformity of the inferior pubic ramus. Degenerative changes in the symphysis pubis. Ligaments Suboptimally assessed by CT. Muscles and Tendons No intramuscular hematoma. Mild fatty atrophy of the gluteal muscles. Soft tissues No significant soft tissue hematoma or infiltration. Vascular calcifications noted. IMPRESSION: Transverse impacted fracture of the subcapital left femoral neck with valgus angulation. Electronically Signed   By: Burman NievesWilliam  Stevens M.D.   On: 02/07/2018 23:16   Dg Hip Unilat With Pelvis 2-3 Views Left  Result Date: 02/07/2018 CLINICAL DATA:  Left hip pain after fall. EXAM: DG HIP (WITH OR WITHOUT PELVIS) 2-3V LEFT COMPARISON:  None. FINDINGS: Acute, impacted fracture of the left femoral neck. No dislocation. Mild bilateral hip osteoarthritis. The pubic symphysis and sacroiliac joints are intact. Osteopenia. Soft tissues are unremarkable. IMPRESSION: 1. Acute, impacted fracture of the left femoral neck. Electronically Signed   By: Obie DredgeWilliam T Derry M.D.   On: 02/07/2018 18:05        Scheduled Meds: .  amLODipine  5 mg Oral Daily  . chlorhexidine  60 mL Topical Once  . chlorthalidone  12.5 mg Oral Daily  . enoxaparin (LOVENOX) injection  40 mg Subcutaneous QHS  . metoprolol tartrate  12.5 mg Oral BID  . potassium chloride  40 mEq Oral Once  . povidone-iodine  2 application Topical Once   Continuous Infusions:   LOS: 1 day    Time spent: 25 mins.More than 50% of that time was spent in counseling and/or coordination of care.   Please Note: This patient record was dictated using Animal nutritionist. Chart creation errors have been sought, but may not always have been located. Such creation errors do  not reflect on the Standard of Medical Care.    Burnadette Pop, MD [facility] Pager [phone]  If 7PM-7AM, please contact night-coverage [email or web address] Password [email or web address] 02/08/2018, 1:53 PM

## 2018-02-08 NOTE — Progress Notes (Signed)
[facility] for planned Surgery tomorrow. No new issues after arrival.

## 2018-02-08 NOTE — Progress Notes (Signed)
PT Cancellation Note  Patient Details Name: Stephanie Reyes MRN: [id number] DOB: [birth year]   Cancelled Treatment:    Reason Eval/Treat Not Completed: Medical issues which prohibited therapy   [facility]; Ortho MD Please re-order at that time, Thank you  [facility] LLC[facility]ILLIAMS,Shandrea Lusk 02/08/2018, 4:46 PM

## 2018-02-08 NOTE — Progress Notes (Signed)
Called and discussed plan with [facility] and patient's son.  Due to PACU hold and OR staffing in addition to transportation issues from [place] we will be unable from a scheduling perspective to perform patient's surgery today.  [facility] needs [facility] be [facility] tomrrow.  Diet placed for today and NPO at midnight placed as well.

## 2018-02-08 NOTE — Consult Note (Signed)
Reason for Consult:Left hip fx Referring Physician: A Adhikari  Stephanie Reyes is an 84 y.o. female.  HPI: Nadean was at home when her foot got tangled in her chair as she was sitting and she fell to the floor onto her left side. She had immediate pain and could not get up. A neighbor called 911 and she was brought to [facility] ED where x-rays showed a left femoral neck fx. Orthopedic surgery was consulted. She lives alone in a retirement community (no ALF/SNF) and ambulates independently.  Past Medical History:  Diagnosis Date  . Essential hypertension   . Premature atrial contractions   . Syncope    a. 02/2017 Echo: EF 55-60%, mild LVH, no rwma, Gr1 DD, triv AI, mildly dil Ao root, mild MR, mod TR, PASP 46mHg.    Past Surgical History:  Procedure Laterality Date  . HERNIA REPAIR    . TONSILLECTOMY      History reviewed. No pertinent family history.  Social History:  reports that she has never smoked. She has never used smokeless tobacco. She reports that she does not drink alcohol or use drugs.  Allergies:  Allergies  Allergen Reactions  . Ampicillin Nausea And Vomiting  . Aspirin Nausea And Vomiting  . Eggs Or Egg-Derived Products     Egg whites Reports not being able to use albumin  . Penicillins Nausea And Vomiting and Swelling    Medications: I have reviewed the patient's current medications.  Results for orders placed or performed during the hospital encounter of 02/07/18 (from the past 48 hour(s))  Basic metabolic panel     Status: Abnormal   Collection Time: 02/07/18  7:55 PM  Result Value Ref Range   Sodium 136 135 - 145 mmol/L   Potassium 2.8 (L) 3.5 - 5.1 mmol/L   Chloride 99 (L) 101 - 111 mmol/L   CO2 25 22 - 32 mmol/L   Glucose, Bld 186 (H) 65 - 99 mg/dL   BUN 16 6 - 20 mg/dL   Creatinine, Ser 0.84 0.44 - 1.00 mg/dL   Calcium 10.5 (H) 8.9 - 10.3 mg/dL   GFR calc non Af Amer 59 (L) >60 mL/min   GFR calc Af Amer >60 >60 mL/min    Comment: (NOTE) The eGFR  has been calculated using the CKD EPI equation. This calculation has not been validated in all clinical situations. eGFR's persistently <60 mL/min signify possible Chronic Kidney Disease.    Anion gap 12 5 - 15    Comment: Performed at [facility] [address], [place] [id number] CBC with Differential     Status: Abnormal   Collection Time: 02/07/18  7:55 PM  Result Value Ref Range   WBC 12.5 (H) 4.0 - 10.5 K/uL   RBC 4.34 3.87 - 5.11 MIL/uL   Hemoglobin 14.7 12.0 - 15.0 g/dL   HCT 41.9 36.0 - 46.0 %   MCV 96.5 78.0 - 100.0 fL   MCH 33.9 26.0 - 34.0 pg   MCHC 35.1 30.0 - 36.0 g/dL   RDW 12.6 11.5 - 15.5 %   Platelets 206 150 - 400 K/uL   Neutrophils Relative % 89 %   Neutro Abs 11.0 (H) 1.7 - 7.7 K/uL   Lymphocytes Relative 7 %   Lymphs Abs 0.9 0.7 - 4.0 K/uL   Monocytes Relative 4 %   Monocytes Absolute 0.5 0.1 - 1.0 K/uL   Eosinophils Relative 0 %   Eosinophils Absolute 0.0 0.0 - 0.7  K/uL   Basophils Relative 0 %   Basophils Absolute 0.0 0.0 - 0.1 K/uL    Comment: Performed at [facility], [place] [address], [place], [place] [address]  Protime-INR     Status: None   Collection Time: 02/07/18  7:55 PM  Result Value Ref Range   Prothrombin Time 13.4 11.4 - 15.2 seconds   INR 1.03     Comment: Performed at [facility], [place] [address], [place], [place] [address]  Magnesium     Status: Abnormal   Collection Time: 02/07/18  8:36 PM  Result Value Ref Range   Magnesium 1.5 (L) 1.7 - 2.4 mg/dL    Comment: Performed at [facility], [place] [address] [place], [place] [address]  Urinalysis, Routine w reflex microscopic     Status: Abnormal   Collection Time: 02/07/18 10:28 PM  Result Value Ref Range   Color, Urine YELLOW YELLOW   APPearance CLOUDY (A) CLEAR   Specific Gravity, Urine 1.011 1.005 - 1.030   pH 8.0 5.0 - 8.0   Glucose, UA NEGATIVE NEGATIVE mg/dL   Hgb urine dipstick NEGATIVE  NEGATIVE   Bilirubin Urine NEGATIVE NEGATIVE   Ketones, ur NEGATIVE NEGATIVE mg/dL   Protein, ur NEGATIVE NEGATIVE mg/dL   Nitrite NEGATIVE NEGATIVE   Leukocytes, UA NEGATIVE NEGATIVE    Comment: Performed at [place] [address], [place], [place] [address]  Comprehensive metabolic panel     Status: Abnormal   Collection Time: 02/08/18  5:14 AM  Result Value Ref Range   Sodium 135 135 - 145 mmol/L   Potassium 3.5 3.5 - 5.1 mmol/L    Comment: DELTA CHECK NOTED REPEATED TO VERIFY NO VISIBLE HEMOLYSIS    Chloride 100 (L) 101 - 111 mmol/L   CO2 27 22 - 32 mmol/L   Glucose, Bld 143 (H) 65 - 99 mg/dL   BUN 16 6 - 20 mg/dL   Creatinine, Ser 0.83 0.44 - 1.00 mg/dL   Calcium 9.3 8.9 - 10.3 mg/dL   Total Protein 6.0 (L) 6.5 - 8.1 g/dL   Albumin 3.3 (L) 3.5 - 5.0 g/dL   AST 24 15 - 41 U/L   ALT 15 14 - 54 U/L   Alkaline Phosphatase 46 38 - 126 U/L   Total Bilirubin 0.9 0.3 - 1.2 mg/dL   GFR calc non Af Amer 59 (L) >60 mL/min   GFR calc Af Amer >60 >60 mL/min    Comment: (NOTE) The eGFR has been calculated using the CKD EPI equation. This calculation has not been validated in all clinical situations. eGFR's persistently <60 mL/min signify possible Chronic Kidney Disease.    Anion gap 8 5 - 15    Comment: Performed at [facility], [place] [address], [place], [place] [address]  CBC     Status: None   Collection Time: 02/08/18  5:14 AM  Result Value Ref Range   WBC 8.6 4.0 - 10.5 K/uL   RBC 3.94 3.87 - 5.11 MIL/uL   Hemoglobin 13.3 12.0 - 15.0 g/dL   HCT 38.4 36.0 - 46.0 %   MCV 97.5 78.0 - 100.0 fL   MCH 33.8 26.0 - 34.0 pg   MCHC 34.6 30.0 - 36.0 g/dL   RDW 12.7 11.5 - 15.5 %   Platelets 173 150 - 400 K/uL    Comment: Performed at [facility], [place] [address] [place], [place] [address]  Magnesium  Status: Abnormal   Collection Time: 02/08/18  5:14 AM  Result Value Ref Range   Magnesium 1.4 (L) 1.7 - 2.4 mg/dL     Comment: Performed at [facility], [place] [address] [place], [place] [address]    Dg Tibia/fibula Left  Result Date: 02/07/2018 CLINICAL DATA:  84 y/o  F; fall with proximal lower leg pain. EXAM: LEFT TIBIA AND FIBULA - 2 VIEW COMPARISON:  None. FINDINGS: Mild narrowing of the femorotibial compartments of the knee. Chondrocalcinosis of knee joint. No acute fracture or dislocation identified. Vascular calcifications and soft tissues. IMPRESSION: 1. No acute fracture or dislocation identified. 2. Osteoarthrosis of the knee with chondrocalcinosis. Electronically Signed   By: Kristine Garbe M.D.   On: 02/07/2018 18:06   Ct Hip Left Wo Contrast  Result Date: 02/07/2018 CLINICAL DATA:  Left hip pain after a fall today. Fracture demonstrated on plain radiographs. EXAM: CT OF THE LEFT HIP WITHOUT CONTRAST TECHNIQUE: Multidetector CT imaging of the left hip was performed according to the standard protocol. Multiplanar CT image reconstructions were also generated. COMPARISON:  Left hip 02/07/2018 FINDINGS: Bones/Joint/Cartilage Mostly transverse impacted fracture of the subcapital left femoral neck. There is valgus angulation of the left hip. No dislocation. Tiny displaced comminuted fragments are demonstrated. No evidence of inter trochanteric involvement. Visualize acetabulum appears intact. Old appearing deformity of the inferior pubic ramus. Degenerative changes in the symphysis pubis. Ligaments Suboptimally assessed by CT. Muscles and Tendons No intramuscular hematoma. Mild fatty atrophy of the gluteal muscles. Soft tissues No significant soft tissue hematoma or infiltration. Vascular calcifications noted. IMPRESSION: Transverse impacted fracture of the subcapital left femoral neck with valgus angulation. Electronically Signed   By: Lucienne Capers M.D.   On: 02/07/2018 23:16   Dg Hip Unilat With Pelvis 2-3 Views Left  Result Date: 02/07/2018 CLINICAL DATA:  Left hip pain after  fall. EXAM: DG HIP (WITH OR WITHOUT PELVIS) 2-3V LEFT COMPARISON:  None. FINDINGS: Acute, impacted fracture of the left femoral neck. No dislocation. Mild bilateral hip osteoarthritis. The pubic symphysis and sacroiliac joints are intact. Osteopenia. Soft tissues are unremarkable. IMPRESSION: 1. Acute, impacted fracture of the left femoral neck. Electronically Signed   By: Titus Dubin M.D.   On: 02/07/2018 18:05    Review of Systems  Constitutional: Negative for weight loss.  HENT: Negative for ear discharge, ear pain, hearing loss and tinnitus.   Eyes: Negative for blurred vision, double vision, photophobia and pain.  Respiratory: Negative for cough, sputum production and shortness of breath.   Cardiovascular: Negative for chest pain.  Gastrointestinal: Negative for abdominal pain, nausea and vomiting.  Genitourinary: Negative for dysuria, flank pain, frequency and urgency.  Musculoskeletal: Positive for joint pain (Left hip). Negative for back pain, falls, myalgias and neck pain.  Neurological: Negative for dizziness, tingling, sensory change, focal weakness, loss of consciousness and headaches.  Endo/Heme/Allergies: Does not bruise/bleed easily.  Psychiatric/Behavioral: Negative for depression, memory loss and substance abuse. The patient is not nervous/anxious.    Blood pressure 102/67, pulse 72, temperature 97.7 F (36.5 C), temperature source Oral, resp. rate 20, SpO2 (!) 89 %. Physical Exam  Constitutional: She appears well-developed and well-nourished. No distress.  HENT:  Head: Normocephalic and atraumatic.  Eyes: Conjunctivae are normal. Right eye exhibits no discharge. Left eye exhibits no discharge. No scleral icterus.  Neck: Normal range of motion.  Cardiovascular: Normal rate and regular rhythm.  Respiratory: Effort normal. No respiratory distress.  Musculoskeletal:  LLE No traumatic wounds, ecchymosis,  or rash  TTP left hip  No knee or ankle effusion, mild anterior  knee pain  Knee stable to varus/ valgus and anterior/posterior stress  Sens DPN, SPN, TN intact  Motor EHL, ext, flex, evers 5/5  DP 2+, PT 1+, No significant edema  Neurological: She is alert.  Skin: Skin is warm and dry. She is not diaphoretic.  Psychiatric: She has a normal mood and affect. Her behavior is normal.    Assessment/Plan: Fall Left femoral neck fx -- For hemiarthroplasty this afternoon at [facility] by Dr. Griffin Basil. NPO until then. Will transfer to [facility] when bed available. Multiple medical problems -- per primary service    Lisette Abu, PA-C Orthopedic Surgery [phone] 02/08/2018, 10:25 AM

## 2018-02-09 ENCOUNTER — Inpatient Hospital Stay (HOSPITAL_COMMUNITY): Payer: Medicare Other | Admitting: Anesthesiology

## 2018-02-09 ENCOUNTER — Inpatient Hospital Stay (HOSPITAL_COMMUNITY): Payer: Medicare Other

## 2018-02-09 ENCOUNTER — Encounter (HOSPITAL_COMMUNITY): Payer: Self-pay | Admitting: Anesthesiology

## 2018-02-09 ENCOUNTER — Encounter (HOSPITAL_COMMUNITY)
Admission: EM | Disposition: A | Discharge status: Skilled Nursing Facility | Payer: Self-pay | Source: Home / Self Care | Attending: Internal Medicine

## 2018-02-09 DIAGNOSIS — S72002A Fracture of unspecified part of neck of left femur, initial encounter for closed fracture: Secondary | ICD-10-CM | POA: Diagnosis present

## 2018-02-09 HISTORY — PX: ANTERIOR APPROACH HEMI HIP ARTHROPLASTY: SHX6690

## 2018-02-09 LAB — CBC
HEMATOCRIT: 36.8 % (ref 36.0–46.0)
Hemoglobin: 12.4 g/dL (ref 12.0–15.0)
MCH: 32.8 pg (ref 26.0–34.0)
MCHC: 33.7 g/dL (ref 30.0–36.0)
MCV: 97.4 fL (ref 78.0–100.0)
Platelets: 127 10*3/uL — ABNORMAL LOW (ref 150–400)
RBC: 3.78 MIL/uL — ABNORMAL LOW (ref 3.87–5.11)
RDW: 12.8 % (ref 11.5–15.5)
WBC: 7.9 10*3/uL (ref 4.0–10.5)

## 2018-02-09 LAB — SURGICAL PCR SCREEN
MRSA, PCR: NEGATIVE
Staphylococcus aureus: NEGATIVE

## 2018-02-09 LAB — CREATININE, SERUM
Creatinine, Ser: 0.86 mg/dL (ref 0.44–1.00)
GFR calc non Af Amer: 57 mL/min — ABNORMAL LOW (ref >60)

## 2018-02-09 LAB — ABO/RH: ABO/RH(D): O POS

## 2018-02-09 LAB — TYPE AND SCREEN
ABO/RH(D): O POS
Antibody Screen: NEGATIVE

## 2018-02-09 SURGERY — HEMIARTHROPLASTY, HIP, DIRECT ANTERIOR APPROACH, FOR FRACTURE
Anesthesia: Monitor Anesthesia Care | Site: Hip | Laterality: Left

## 2018-02-09 MED ORDER — ONDANSETRON HCL 4 MG/2ML IJ SOLN
4.0000 mg | Freq: Four times a day (QID) | INTRAMUSCULAR | Status: DC | PRN
  Administered 2018-02-11: 4 mg via INTRAVENOUS
  Filled 2018-02-09: qty 2

## 2018-02-09 MED ORDER — FENTANYL CITRATE (PF) 250 MCG/5ML IJ SOLN
INTRAMUSCULAR | Status: AC
  Filled 2018-02-09: qty 5

## 2018-02-09 MED ORDER — PHENYLEPHRINE HCL 10 MG/ML IJ SOLN
INTRAMUSCULAR | Status: DC | PRN
  Administered 2018-02-09: 120 ug via INTRAVENOUS
  Administered 2018-02-09: 60 ug via INTRAVENOUS

## 2018-02-09 MED ORDER — SODIUM CHLORIDE 0.9 % IR SOLN
Status: DC | PRN
  Administered 2018-02-09: 3000 mL

## 2018-02-09 MED ORDER — 0.9 % SODIUM CHLORIDE (POUR BTL) OPTIME
TOPICAL | Status: DC | PRN
  Administered 2018-02-09: 1000 mL

## 2018-02-09 MED ORDER — BUPIVACAINE-EPINEPHRINE (PF) 0.5% -1:200000 IJ SOLN
INTRAMUSCULAR | Status: AC
  Filled 2018-02-09: qty 30

## 2018-02-09 MED ORDER — FENTANYL CITRATE (PF) 100 MCG/2ML IJ SOLN: 25.0000 ug | INTRAMUSCULAR | Status: DC | PRN

## 2018-02-09 MED ORDER — DEXAMETHASONE SODIUM PHOSPHATE 10 MG/ML IJ SOLN
INTRAMUSCULAR | Status: DC | PRN
  Administered 2018-02-09: 10 mg via INTRAVENOUS

## 2018-02-09 MED ORDER — PHENYLEPHRINE 40 MCG/ML (10ML) SYRINGE FOR IV PUSH (FOR BLOOD PRESSURE SUPPORT)
PREFILLED_SYRINGE | INTRAVENOUS | Status: AC
  Filled 2018-02-09: qty 10

## 2018-02-09 MED ORDER — PROPOFOL 10 MG/ML IV BOLUS
INTRAVENOUS | Status: AC
  Filled 2018-02-09: qty 20

## 2018-02-09 MED ORDER — KETOROLAC TROMETHAMINE 30 MG/ML IJ SOLN
INTRAMUSCULAR | Status: AC
  Filled 2018-02-09: qty 1

## 2018-02-09 MED ORDER — BUPIVACAINE-EPINEPHRINE (PF) 0.5% -1:200000 IJ SOLN
INTRAMUSCULAR | Status: DC | PRN
  Administered 2018-02-09: 30 mL

## 2018-02-09 MED ORDER — PHENOL 1.4 % MT LIQD: 1.0000 | OROMUCOSAL | Status: DC | PRN

## 2018-02-09 MED ORDER — ONDANSETRON HCL 4 MG PO TABS
4.0000 mg | ORAL_TABLET | Freq: Four times a day (QID) | ORAL | Status: DC | PRN
Start: 2018-02-09 — End: 2018-02-12

## 2018-02-09 MED ORDER — VANCOMYCIN HCL IN DEXTROSE 1-5 GM/200ML-% IV SOLN
1000.0000 mg | Freq: Two times a day (BID) | INTRAVENOUS | Status: AC
  Administered 2018-02-10: 1000 mg via INTRAVENOUS
  Filled 2018-02-09: qty 200

## 2018-02-09 MED ORDER — LACTATED RINGERS IV SOLN
INTRAVENOUS | Status: DC
  Administered 2018-02-09 – 2018-02-11 (×2): via INTRAVENOUS

## 2018-02-09 MED ORDER — MENTHOL 3 MG MT LOZG: 1.0000 | LOZENGE | OROMUCOSAL | Status: DC | PRN

## 2018-02-09 MED ORDER — ENOXAPARIN SODIUM 40 MG/0.4ML ~~LOC~~ SOLN
40.0000 mg | SUBCUTANEOUS | Status: DC
  Administered 2018-02-10 – 2018-02-12 (×3): 40 mg via SUBCUTANEOUS
  Filled 2018-02-09 (×3): qty 0.4

## 2018-02-09 MED ORDER — METOCLOPRAMIDE HCL 5 MG PO TABS: 5.0000 mg | ORAL_TABLET | Freq: Three times a day (TID) | ORAL | Status: DC | PRN

## 2018-02-09 MED ORDER — SUGAMMADEX SODIUM 200 MG/2ML IV SOLN
INTRAVENOUS | Status: AC
  Filled 2018-02-09: qty 2

## 2018-02-09 MED ORDER — OXYCODONE HCL 5 MG/5ML PO SOLN: 5.0000 mg | Freq: Once | ORAL | Status: DC | PRN

## 2018-02-09 MED ORDER — EPHEDRINE 5 MG/ML INJ
INTRAVENOUS | Status: AC
  Filled 2018-02-09: qty 10

## 2018-02-09 MED ORDER — PROPOFOL 500 MG/50ML IV EMUL
INTRAVENOUS | Status: DC | PRN
  Administered 2018-02-09: 25 ug/kg/min via INTRAVENOUS

## 2018-02-09 MED ORDER — OXYCODONE HCL 5 MG PO TABS
5.0000 mg | ORAL_TABLET | Freq: Once | ORAL | Status: DC | PRN
Start: 2018-02-09 — End: 2018-02-09

## 2018-02-09 MED ORDER — SUCCINYLCHOLINE CHLORIDE 200 MG/10ML IV SOSY
PREFILLED_SYRINGE | INTRAVENOUS | Status: AC
  Filled 2018-02-09: qty 10

## 2018-02-09 MED ORDER — PROPOFOL 10 MG/ML IV BOLUS
INTRAVENOUS | Status: DC | PRN
  Administered 2018-02-09 (×3): 20 mg via INTRAVENOUS

## 2018-02-09 MED ORDER — ONDANSETRON HCL 4 MG/2ML IJ SOLN
INTRAMUSCULAR | Status: AC
  Filled 2018-02-09: qty 2

## 2018-02-09 MED ORDER — METOCLOPRAMIDE HCL 5 MG/ML IJ SOLN: 5.0000 mg | Freq: Three times a day (TID) | INTRAMUSCULAR | Status: DC | PRN

## 2018-02-09 MED ORDER — ONDANSETRON HCL 4 MG/2ML IJ SOLN
INTRAMUSCULAR | Status: DC | PRN
  Administered 2018-02-09: 4 mg via INTRAVENOUS

## 2018-02-09 MED ORDER — DOCUSATE SODIUM 100 MG PO CAPS
100.0000 mg | ORAL_CAPSULE | Freq: Two times a day (BID) | ORAL | Status: DC
  Administered 2018-02-09 – 2018-02-12 (×6): 100 mg via ORAL
  Filled 2018-02-09 (×6): qty 1

## 2018-02-09 MED ORDER — SODIUM CHLORIDE 0.9 % IJ SOLN
INTRAMUSCULAR | Status: DC | PRN
  Administered 2018-02-09: 30 mL

## 2018-02-09 MED ORDER — DEXAMETHASONE SODIUM PHOSPHATE 10 MG/ML IJ SOLN
INTRAMUSCULAR | Status: AC
  Filled 2018-02-09: qty 1

## 2018-02-09 MED ORDER — KETOROLAC TROMETHAMINE 30 MG/ML IJ SOLN
INTRAMUSCULAR | Status: DC | PRN
  Administered 2018-02-09: 30 mg via INTRAVENOUS

## 2018-02-09 MED ORDER — PHENYLEPHRINE HCL 10 MG/ML IJ SOLN
INTRAVENOUS | Status: DC | PRN
  Administered 2018-02-09: 60 ug/min via INTRAVENOUS

## 2018-02-09 MED ORDER — SODIUM CHLORIDE 0.9 % IR SOLN
Status: DC | PRN
  Administered 2018-02-09: 1000 mL

## 2018-02-09 MED ORDER — BUPIVACAINE IN DEXTROSE 0.75-8.25 % IT SOLN
INTRATHECAL | Status: DC | PRN
  Administered 2018-02-09: 1.8 mL via INTRATHECAL

## 2018-02-09 MED ORDER — ROCURONIUM BROMIDE 10 MG/ML (PF) SYRINGE
PREFILLED_SYRINGE | INTRAVENOUS | Status: AC
  Filled 2018-02-09: qty 5

## 2018-02-09 SURGICAL SUPPLY — 53 items
ADH SKN CLS APL DERMABOND .7 (GAUZE/BANDAGES/DRESSINGS) ×2
BLADE CLIPPER SURG (BLADE) IMPLANT
CAPT HIP HEMI 2 ×2 IMPLANT
CHLORAPREP W/TINT 26ML (MISCELLANEOUS) ×3 IMPLANT
COVER SURGICAL LIGHT HANDLE (MISCELLANEOUS) ×3 IMPLANT
DERMABOND ADVANCED (GAUZE/BANDAGES/DRESSINGS) ×4
DERMABOND ADVANCED .7 DNX12 (GAUZE/BANDAGES/DRESSINGS) ×2 IMPLANT
DRAPE C-ARM 42X72 X-RAY (DRAPES) ×3 IMPLANT
DRAPE IMP U-DRAPE 54X76 (DRAPES) ×6 IMPLANT
DRAPE STERI IOBAN 125X83 (DRAPES) ×3 IMPLANT
DRAPE U-SHAPE 47X51 STRL (DRAPES) ×9 IMPLANT
DRSG AQUACEL AG ADV 3.5X10 (GAUZE/BANDAGES/DRESSINGS) ×3 IMPLANT
ELECT BLADE 4.0 EZ CLEAN MEGAD (MISCELLANEOUS) ×3
ELECT REM PT RETURN 9FT ADLT (ELECTROSURGICAL) ×3
ELECTRODE BLDE 4.0 EZ CLN MEGD (MISCELLANEOUS) ×1 IMPLANT
ELECTRODE REM PT RTRN 9FT ADLT (ELECTROSURGICAL) ×1 IMPLANT
EVACUATOR 1/8 PVC DRAIN (DRAIN) IMPLANT
GLOVE BIO SURGEON STRL SZ8.5 (GLOVE) ×8 IMPLANT
GLOVE BIOGEL PI IND STRL 8.5 (GLOVE) ×1 IMPLANT
GLOVE BIOGEL PI INDICATOR 8.5 (GLOVE) ×2
GOWN STRL REUS W/ TWL LRG LVL3 (GOWN DISPOSABLE) ×2 IMPLANT
GOWN STRL REUS W/TWL 2XL LVL3 (GOWN DISPOSABLE) ×3 IMPLANT
GOWN STRL REUS W/TWL LRG LVL3 (GOWN DISPOSABLE) ×6
HANDPIECE INTERPULSE COAX TIP (DISPOSABLE) ×3
HOOD PEEL AWAY FLYTE STAYCOOL (MISCELLANEOUS) ×6 IMPLANT
KIT BASIN OR (CUSTOM PROCEDURE TRAY) ×3 IMPLANT
KIT TURNOVER KIT B (KITS) ×3 IMPLANT
MANIFOLD NEPTUNE II (INSTRUMENTS) ×3 IMPLANT
MARKER SKIN DUAL TIP RULER LAB (MISCELLANEOUS) ×3 IMPLANT
NDL SPNL 18GX3.5 QUINCKE PK (NEEDLE) ×1 IMPLANT
NEEDLE SPNL 18GX3.5 QUINCKE PK (NEEDLE) ×3 IMPLANT
NS IRRIG 1000ML POUR BTL (IV SOLUTION) ×3 IMPLANT
PACK TOTAL JOINT (CUSTOM PROCEDURE TRAY) ×3 IMPLANT
PACK UNIVERSAL I (CUSTOM PROCEDURE TRAY) ×3 IMPLANT
PAD ARMBOARD 7.5X6 YLW CONV (MISCELLANEOUS) ×8 IMPLANT
SAW OSC TIP CART 19.5X105X1.3 (SAW) ×3 IMPLANT
SEALER BIPOLAR AQUA 6.0 (INSTRUMENTS) ×2 IMPLANT
SET HNDPC FAN SPRY TIP SCT (DISPOSABLE) ×1 IMPLANT
SUCTION FRAZIER HANDLE 10FR (MISCELLANEOUS) ×2
SUCTION TUBE FRAZIER 10FR DISP (MISCELLANEOUS) ×1 IMPLANT
SUT ETHIBOND NAB CT1 #1 30IN (SUTURE) ×6 IMPLANT
SUT MNCRL AB 3-0 PS2 18 (SUTURE) ×3 IMPLANT
SUT MON AB 2-0 CT1 36 (SUTURE) ×3 IMPLANT
SUT VIC AB 1 CT1 27 (SUTURE) ×3
SUT VIC AB 1 CT1 27XBRD ANBCTR (SUTURE) ×1 IMPLANT
SUT VIC AB 2-0 CT1 27 (SUTURE) ×3
SUT VIC AB 2-0 CT1 TAPERPNT 27 (SUTURE) ×1 IMPLANT
SUT VLOC 180 0 24IN GS25 (SUTURE) ×3 IMPLANT
SYR 50ML LL SCALE MARK (SYRINGE) ×3 IMPLANT
TOWEL OR 17X24 6PK STRL BLUE (TOWEL DISPOSABLE) ×3 IMPLANT
TOWEL OR 17X26 10 PK STRL BLUE (TOWEL DISPOSABLE) ×3 IMPLANT
TRAY FOLEY CATH SILVER 16FR (SET/KITS/TRAYS/PACK) ×2 IMPLANT
WATER STERILE IRR 1000ML POUR (IV SOLUTION) ×7 IMPLANT

## 2018-02-09 NOTE — Op Note (Signed)
OPERATIVE REPORT  SURGEON: Samson FredericBrian Yarden Manuelito, MD   ASSISTANT: Alphonsa OverallBrad Dixon, PA-C.  PREOPERATIVE DIAGNOSIS: Displaced Left femoral neck fracture.   POSTOPERATIVE DIAGNOSIS: Displaced Left femoral neck fracture.   PROCEDURE: Left hip hemiarthroplasty, anterior approach.   IMPLANTS: DePuy Tri Lock stem, size 4, hi offset, with a 0 mm spacer and a 48 mm monopolar head ball.  ANESTHESIA:  Spinal  ANTIBIOTICS: 1,000 mg Vancomycin (facility resident).  ESTIMATED BLOOD LOSS:-300 mL    DRAINS: None.  COMPLICATIONS: None   CONDITION: PACU - hemodynamically stable.   BRIEF CLINICAL NOTE: Stephanie Reyes is a 84 y.o. female with a displaced Left femoral neck fracture. The patient was admitted to the hospitalist service and underwent perioperative risk stratification and medical optimization. The risks, benefits, and alternatives to hemiarthroplasty were explained, and the patient elected to proceed.  PROCEDURE IN DETAIL: The patient was taken to the operating room and general anesthesia was induced on the hospital bed. The patient was then positioned on the Hana table. All bony prominences were well padded. The hip was prepped and draped in the normal sterile surgical fashion. A time-out was called verifying side and site of surgery. Antibiotics were given within 60 minutes of beginning the procedure.  The direct anterior approach to the hip was performed through the Hueter interval. Lateral femoral circumflex vessels were treated with the Auqumantys. The anterior capsule was exposed and an inverted T capsulotomy was made. Fracture hematoma was encountered and evacuated. The patient was found to have a comminuted Left subcapital femoral neck fracture. I freshened the femoral neck cut with a saw. I removed the femoral neck fragment. A corkscrew was placed into the head and the head was removed. This was passed to the back table and was measured.  Acetabular exposure was  achieved. I examined the articular cartilage which was intact. The labrum was intact. A 48 mm trial head was placed and found to have excellent fit.  I then gained femoral exposure taking care to protect the abductors and greater trochanter. This was performed using standard external rotation, extension, and adduction. The capsule was peeled off the inner aspect of the greater trochanter, taking care to preserve the short external rotators. A cookie cutter was used to enter the femoral canal, and then the femoral canal finder was used to confirm location. I then sequentially broached up to a size 4. Calcar planer was used on the femoral neck remnant. I paced a hi neck and a 36 + 1.5 head ball.The hip was reduced. Leg lengths were checked fluoroscopically. The hip was dislocated and trial components were removed. I placed the real stem followed by the real spacer and head ball. A single reduction maneuver was performed and the hip was reduced. Fluoroscopy was used to confirm component position and leg lengths. At 90 degrees of external rotation and extension, the hip was stable to an anterior directed force.  The wound was copiously irrigated with normal saline solution. Marcaine solution was injected into the periarticular soft tissue. The wound was closed in layers using #1 Vicryl and V-Loc for the fascia, 2-0 Vicryl for the subcutaneous fat, 2-0 Monocryl for the deep dermal layer, 3-0 running Monocryl subcuticular stitch and glue for the skin. Once the glue was fully dried, an Aquacell Ag dressing was applied. The patient was then awakened from anesthesia and transported to the recovery room in stable condition. Sponge, needle, and instrument counts were correct at the end of the case x2. The patient tolerated the procedure  well and there were no known complications.  Please note that a surgical assistant was a medical necessity for this procedure to perform it in a safe and  expeditious manner. Assistant was necessary to provide appropriate retraction of vital neurovascular structures, to prevent femoral fracture, and to allow for anatomic placement of the prosthesis.

## 2018-02-09 NOTE — Anesthesia Procedure Notes (Signed)
Spinal  Patient location during procedure: OR Start time: 02/09/2018 1:56 PM End time: 02/09/2018 2:01 PM Staffing Anesthesiologist: Val EagleMoser, [place] Savini, MD Preanesthetic Checklist Completed: patient identified, surgical consent, pre-op evaluation, timeout performed, IV checked, risks and benefits discussed and monitors and equipment checked Spinal Block Patient position: left lateral decubitus Prep: site prepped and draped Patient monitoring: heart rate, cardiac monitor, continuous pulse ox and blood pressure Approach: midline Location: L4-5 Injection technique: single-shot Needle Needle type: Pencan  Needle gauge: 24 G Needle length: 10 cm Assessment Sensory level: T6

## 2018-02-09 NOTE — Progress Notes (Signed)
Orthopedic Tech Progress Note Patient Details:  Midge AverCorinne L Winiecki December 06, 1923 [id number]  Ortho Devices Ortho Device/Splint Location: Trapeze bar   Post Interventions Patient Tolerated: Unable to use device properly   Saul FordyceJennifer C Bernerd Terhune 02/09/2018, 9:20 PM

## 2018-02-09 NOTE — Transfer of Care (Signed)
Immediate Anesthesia Transfer of Care Note  Patient: Stephanie Reyes  Procedure(s) Performed: ANTERIOR APPROACH HEMI HIP ARTHROPLASTY (Left Hip)  Patient Location: PACU  Anesthesia Type:MAC and Spinal  Level of Consciousness: awake, alert , oriented and patient cooperative  Airway & Oxygen Therapy: Patient Spontanous Breathing and Patient connected to face mask oxygen  Post-op Assessment: Report given to RN and Post -op Vital signs reviewed and stable  Post vital signs: Reviewed and stable  Last Vitals:  Vitals Value Taken Time  BP 92/58 02/09/2018  4:04 PM  Temp    Pulse 68 02/09/2018  4:08 PM  Resp 19 02/09/2018  4:08 PM  SpO2 94 % 02/09/2018  4:08 PM  Vitals shown include unvalidated device data.  Last Pain:  Vitals:   02/09/18 0544  TempSrc: Oral  PainSc:       Patients Stated Pain Goal: 3 (45/40/98 1191)  Complications: No apparent anesthesia complications

## 2018-02-09 NOTE — Discharge Instructions (Signed)
°Dr. Danarius Mcconathy °Joint Replacement Specialist °[facility] °[address] °[place], [place] [address] °[phone] ° ° °TOTAL HIP REPLACEMENT POSTOPERATIVE DIRECTIONS ° ° ° °Hip Rehabilitation, Guidelines Following Surgery  ° °WEIGHT BEARING °Weight bearing as tolerated with assist device (walker, cane, etc) as directed, use it as long as suggested by your surgeon or therapist, typically at least 4-6 weeks. ° °The results of a hip operation are greatly improved after range of motion and muscle strengthening exercises. Follow all safety measures which are given to protect your hip. If any of these exercises cause increased pain or swelling in your joint, decrease the amount until you are comfortable again. Then slowly increase the exercises. Call your caregiver if you have problems or questions.  ° °HOME CARE INSTRUCTIONS  °Most of the following instructions are designed to prevent the dislocation of your new hip.  °Remove items at home which could result in a fall. This includes throw rugs or furniture in walking pathways.  °Continue medications as instructed at time of discharge. °· You may have some home medications which will be placed on hold until you complete the course of blood thinner medication. °· You may start showering once you are discharged home. Do not remove your dressing. °Do not put on socks or shoes without following the instructions of your caregivers.   °Sit on chairs with arms. Use the chair arms to help push yourself up when arising.  °Arrange for the use of a toilet seat elevator so you are not sitting low.  °· Walk with walker as instructed.  °You may resume a sexual relationship in one month or when given the OK by your caregiver.  °Use walker as long as suggested by your caregivers.  °You may put full weight on your legs and walk as much as is comfortable. °Avoid periods of inactivity such as sitting longer than an hour when not asleep. This helps prevent  blood clots.  °You may return to work once you are cleared by your surgeon.  °Do not drive a car for 6 weeks or until released by your surgeon.  °Do not drive while taking narcotics.  °Wear elastic stockings for two weeks following surgery during the day but you may remove then at night.  °Make sure you keep all of your appointments after your operation with all of your doctors and caregivers. You should call the office at the above phone number and make an appointment for approximately two weeks after the date of your surgery. °Please pick up a stool softener and laxative for home use as long as you are requiring pain medications. °· ICE to the affected hip every three hours for 30 minutes at a time and then as needed for pain and swelling. Continue to use ice on the hip for pain and swelling from surgery. You may notice swelling that will progress down to the foot and ankle.  This is normal after surgery.  Elevate the leg when you are not up walking on it.   °It is important for you to complete the blood thinner medication as prescribed by your doctor. °· Continue to use the breathing machine which will help keep your temperature down.  It is common for your temperature to cycle up and down following surgery, especially at night when you are not up moving around and exerting yourself.  The breathing machine keeps your lungs expanded and your temperature down. ° °RANGE OF MOTION AND STRENGTHENING EXERCISES  °These exercises are   designed to help you keep full movement of your hip joint. Follow your caregiver's or physical therapist's instructions. Perform all exercises about fifteen times, three times per day or as directed. Exercise both hips, even if you have had only one joint replacement. These exercises can be done on a training (exercise) mat, on the floor, on a table or on a bed. Use whatever works the best and is most comfortable for you. Use music or television while you are exercising so that the exercises  are a pleasant break in your day. This will make your life better with the exercises acting as a break in routine you can look forward to.  °Lying on your back, slowly slide your foot toward your buttocks, raising your knee up off the floor. Then slowly slide your foot back down until your leg is straight again.  °Lying on your back spread your legs as far apart as you can without causing discomfort.  °Lying on your side, raise your upper leg and foot straight up from the floor as far as is comfortable. Slowly lower the leg and repeat.  °Lying on your back, tighten up the muscle in the front of your thigh (quadriceps muscles). You can do this by keeping your leg straight and trying to raise your heel off the floor. This helps strengthen the largest muscle supporting your knee.  °Lying on your back, tighten up the muscles of your buttocks both with the legs straight and with the knee bent at a comfortable angle while keeping your heel on the floor.  ° °SKILLED REHAB INSTRUCTIONS: °If the patient is transferred to a skilled rehab facility following release from the hospital, a list of the current medications will be sent to the facility for the patient to continue.  When discharged from the skilled rehab facility, please have the facility set up the patient's Home Health Physical Therapy prior to being released. Also, the skilled facility will be responsible for providing the patient with their medications at time of release from the facility to include their pain medication and their blood thinner medication. If the patient is still at the rehab facility at time of the two week follow up appointment, the skilled rehab facility will also need to assist the patient in arranging follow up appointment in our office and any transportation needs. ° °MAKE SURE YOU:  °Understand these instructions.  °Will watch your condition.  °Will get help right away if you are not doing well or get worse. ° °Pick up stool softner and  laxative for home use following surgery while on pain medications. °Do not remove your dressing. °The dressing is waterproof--it is OK to take showers. °Continue to use ice for pain and swelling after surgery. °Do not use any lotions or creams on the incision until instructed by your surgeon. °Total Hip Protocol. ° ° °

## 2018-02-09 NOTE — Consult Note (Signed)
ORTHOPAEDIC CONSULTATION  REQUESTING PHYSICIAN: Pearson Grippe, MD  PCP:  Shaune Pollack, MD  Chief Complaint: Left hip pain.  HPI: Stephanie Reyes is a 84 y.o. female who complains of left hip pain and inability to weight-bear after a ground-level fall yesterday.  She was brought to the emergency department, where x-rays and CT scan revealed an unstable, displaced left femoral neck fracture.  She was admitted to the hospitalist service for perioperative risk stratification and medical optimization.  She normally walks without an assistive device.  She lives in an assisted living facility.  She denies other injuries.  I was asked to assume care by Dr. Ramond Marrow.  Past Medical History:  Diagnosis Date  . Essential hypertension   . Premature atrial contractions   . Syncope    a. 02/2017 Echo: EF 55-60%, mild LVH, no rwma, Gr1 DD, triv AI, mildly dil Ao root, mild MR, mod TR, PASP .   Past Surgical History:  Procedure Laterality Date  . HERNIA REPAIR    . TONSILLECTOMY     Social History   Socioeconomic History  . Marital status: Divorced    Spouse name: Not on file  . Number of children: Not on file  . Years of education: Not on file  . Highest education level: Not on file  Occupational History  . Not on file  Social Needs  . Financial resource strain: Not on file  . Food insecurity:    Worry: Not on file    Inability: Not on file  . Transportation needs:    Medical: Not on file    Non-medical: Not on file  Tobacco Use  . Smoking status: Never Smoker  . Smokeless tobacco: Never Used  Substance and Sexual Activity  . Alcohol use: No  . Drug use: No  . Sexual activity: Not on file  Lifestyle  . Physical activity:    Days per week: Not on file    Minutes per session: Not on file  . Stress: Not on file  Relationships  . Social connections:    Talks on phone: Not on file    Gets together: Not on file    Attends religious service: Not on file    Active member  of club or organization: Not on file    Attends meetings of clubs or organizations: Not on file    Relationship status: Not on file  Other Topics Concern  . Not on file  Social History Narrative  . Not on file   History reviewed. No pertinent family history. Allergies  Allergen Reactions  . Ampicillin Nausea And Vomiting  . Aspirin Nausea And Vomiting  . Eggs Or Egg-Derived Products     Egg whites Reports not being able to use albumin  . Penicillins Nausea And Vomiting and Swelling   Prior to Admission medications   Medication Sig Start Date End Date Taking? Authorizing Provider  amLODipine (NORVASC) 5 MG tablet Take 1 tablet (5 mg total) by mouth daily. 03/13/17 02/07/18 Yes Creig Hines, NP  chlorthalidone (HYGROTON) 25 MG tablet Take 0.5 tablets (12.5 mg total) by mouth daily. 04/26/17 02/07/18 Yes Hilty, Lisette Abu, MD  metoprolol tartrate (LOPRESSOR) 25 MG tablet Take 0.5 tablets (12.5 mg total) by mouth 2 (two) times daily. 04/26/17  Yes Hilty, Lisette Abu, MD  Multiple Vitamin (MULTIVITAMIN WITH MINERALS) TABS tablet Take 1 tablet by mouth every other day.   Yes [provider]  OVER THE COUNTER MEDICATION Take 1 tablet  by mouth every other day. For bones   Yes [provider]   Dg Tibia/fibula Left  Result Date: 02/07/2018 CLINICAL DATA:  84 y/o  F; fall with proximal lower leg pain. EXAM: LEFT TIBIA AND FIBULA - 2 VIEW COMPARISON:  None. FINDINGS: Mild narrowing of the femorotibial compartments of the knee. Chondrocalcinosis of knee joint. No acute fracture or dislocation identified. Vascular calcifications and soft tissues. IMPRESSION: 1. No acute fracture or dislocation identified. 2. Osteoarthrosis of the knee with chondrocalcinosis. Electronically Signed   By: Mitzi HansenLance  Furusawa-Stratton M.D.   On: 02/07/2018 18:06   Ct Hip Left Wo Contrast  Result Date: 02/07/2018 CLINICAL DATA:  Left hip pain after a fall today. Fracture demonstrated on plain radiographs.  EXAM: CT OF THE LEFT HIP WITHOUT CONTRAST TECHNIQUE: Multidetector CT imaging of the left hip was performed according to the standard protocol. Multiplanar CT image reconstructions were also generated. COMPARISON:  Left hip 02/07/2018 FINDINGS: Bones/Joint/Cartilage Mostly transverse impacted fracture of the subcapital left femoral neck. There is valgus angulation of the left hip. No dislocation. Tiny displaced comminuted fragments are demonstrated. No evidence of inter trochanteric involvement. Visualize acetabulum appears intact. Old appearing deformity of the inferior pubic ramus. Degenerative changes in the symphysis pubis. Ligaments Suboptimally assessed by CT. Muscles and Tendons No intramuscular hematoma. Mild fatty atrophy of the gluteal muscles. Soft tissues No significant soft tissue hematoma or infiltration. Vascular calcifications noted. IMPRESSION: Transverse impacted fracture of the subcapital left femoral neck with valgus angulation. Electronically Signed   By: Burman NievesWilliam  Stevens M.D.   On: 02/07/2018 23:16   Dg Hip Unilat With Pelvis 2-3 Views Left  Result Date: 02/07/2018 CLINICAL DATA:  Left hip pain after fall. EXAM: DG HIP (WITH OR WITHOUT PELVIS) 2-3V LEFT COMPARISON:  None. FINDINGS: Acute, impacted fracture of the left femoral neck. No dislocation. Mild bilateral hip osteoarthritis. The pubic symphysis and sacroiliac joints are intact. Osteopenia. Soft tissues are unremarkable. IMPRESSION: 1. Acute, impacted fracture of the left femoral neck. Electronically Signed   By: Obie DredgeWilliam T Derry M.D.   On: 02/07/2018 18:05    Positive ROS: All other systems have been reviewed and were otherwise negative with the exception of those mentioned in the HPI and as above.  Physical Exam: General: Alert, no acute distress Cardiovascular: No pedal edema Respiratory: No cyanosis, no use of accessory musculature GI: No organomegaly, abdomen is soft and non-tender Skin: No lesions in the area of chief  complaint Neurologic: Sensation intact distally Psychiatric: Patient is competent for consent with normal mood and affect Lymphatic: No axillary or cervical lymphadenopathy  MUSCULOSKELETAL: Examination of the left hip reveals no skin wounds or lesions.  She is mildly shortened.  She has significant external rotational deformity.  She has pain with attempted logrolling.  She does have intact dorsiflexion, plantar flexion, and great toe extension.  She reports intact sensation.  She has palpable pedal pulses.  Assessment: Displaced left femoral neck fracture  Plan: I discussed the findings with the patient and her family.  The patient has an unstable left femoral neck fracture.  I recommend surgical treatment.  We discussed the risks, benefits, and alternatives to left hip hemiarthroplasty.  We will plan for surgery today.  All questions were solicited and answered.  The risks, benefits, and alternatives were discussed with the patient. There are risks associated with the surgery including, but not limited to, problems with anesthesia (death), infection, instability (giving out of the joint), dislocation, differences in leg length/angulation/rotation, fracture of bones,  loosening or failure of implants, hematoma (blood accumulation) which may require surgical drainage, blood clots, pulmonary embolism, nerve injury (foot drop and lateral thigh numbness), and blood vessel injury. The patient understands these risks and elects to proceed.    Jonette Pesa, MD Cell [phone]    02/09/2018 1:20 PM

## 2018-02-09 NOTE — Anesthesia Procedure Notes (Signed)
Procedure Name: MAC Date/Time: 02/09/2018 1:46 PM Performed by: Victoriano Lain, CRNA Pre-anesthesia Checklist: Patient identified, Suction available, Patient being monitored, Timeout performed and Emergency Drugs available Patient Re-evaluated:Patient Re-evaluated prior to induction Oxygen Delivery Method: Simple face mask Dental Injury: Teeth and Oropharynx as per pre-operative assessment

## 2018-02-09 NOTE — Progress Notes (Signed)
Attempt to call report - pt placed on hold

## 2018-02-09 NOTE — Anesthesia Postprocedure Evaluation (Signed)
Anesthesia Post Note  Patient: Stephanie Reyes  Procedure(s) Performed: ANTERIOR APPROACH HEMI HIP ARTHROPLASTY (Left Hip)     Patient location during evaluation: PACU Anesthesia Type: MAC and Spinal Level of consciousness: awake and alert Pain management: pain level controlled Vital Signs Assessment: post-procedure vital signs reviewed and stable Respiratory status: spontaneous breathing, nonlabored ventilation, respiratory function stable and patient connected to nasal cannula oxygen Cardiovascular status: stable and blood pressure returned to baseline Postop Assessment: no apparent nausea or vomiting and spinal receding Anesthetic complications: no    Last Vitals:  Vitals:   02/09/18 1800 02/09/18 1833  BP: (!) 98/59 105/67  Pulse:  80  Resp: (!) 23 19  Temp: (!) 36.4 C 36.4 C  SpO2: 99% (!) 86%    Last Pain:  Vitals:   02/09/18 1833  TempSrc: Oral  PainSc:     LLE Motor Response: Purposeful movement (02/09/18 1936) LLE Sensation: No pain;Decreased (02/09/18 1936)          Daryle Boyington

## 2018-02-09 NOTE — Progress Notes (Signed)
PT Cancellation Note  Patient Details Name: Stephanie AverCorinne L Reyes MRN: [id number] DOB: October 22, 1924   Cancelled Treatment:      Hold PT 4/6. Pt currently in surgery. PT will follow up per post op order.    Dessie ComaDavid J Bashar Milam 02/09/2018, 12:35 PM

## 2018-02-09 NOTE — Anesthesia Preprocedure Evaluation (Signed)
Anesthesia Evaluation  Patient identified by MRN, date of birth, ID band Patient awake    Reviewed: Allergy & Precautions, NPO status , Patient's Chart, lab work & pertinent test results, reviewed documented beta blocker date and time   Airway Mallampati: II  TM Distance: >3 FB Neck ROM: Full    Dental  (+) Edentulous Upper, Edentulous Lower   Pulmonary neg pulmonary ROS,    breath sounds clear to auscultation       Cardiovascular hypertension, Pt. on medications and Pt. on home beta blockers + Peripheral Vascular Disease   Rhythm:Irregular     Neuro/Psych negative neurological ROS     GI/Hepatic negative GI ROS, Neg liver ROS,   Endo/Other    Renal/GU Renal InsufficiencyRenal disease     Musculoskeletal   Abdominal   Peds  Hematology   Anesthesia Other Findings   Reproductive/Obstetrics                             Anesthesia Physical Anesthesia Plan  ASA: II  Anesthesia Plan: MAC and Spinal   Post-op Pain Management:    Induction:   PONV Risk Score and Plan: 2 and Ondansetron and Treatment may vary due to age or medical condition  Airway Management Planned: Nasal Cannula  Additional Equipment:   Intra-op Plan:   Post-operative Plan:   Informed Consent: I have reviewed the patients History and Physical, chart, labs and discussed the procedure including the risks, benefits and alternatives for the proposed anesthesia with the patient or authorized representative who has indicated his/her understanding and acceptance.   Dental advisory given  Plan Discussed with: Surgeon and CRNA  Anesthesia Plan Comments:         Anesthesia Quick Evaluation

## 2018-02-09 NOTE — Progress Notes (Signed)
Confirmed with Dr. Linna CapriceSwinteck that he wants both ancef and vancomycin.

## 2018-02-09 NOTE — Plan of Care (Signed)
  Problem: Health Behavior/Discharge Planning: Goal: Ability to manage health-related needs will improve Outcome: Progressing   Problem: Clinical Measurements: Goal: Ability to maintain clinical measurements within normal limits will improve Outcome: Progressing Goal: Will remain free from infection Outcome: Progressing Goal: Diagnostic test results will improve Outcome: Progressing Goal: Respiratory complications will improve Outcome: Progressing Goal: Cardiovascular complication will be avoided Outcome: Progressing   Problem: Activity: Goal: Risk for activity intolerance will decrease Outcome: Progressing   Problem: Nutrition: Goal: Adequate nutrition will be maintained Outcome: Progressing   Problem: Coping: Goal: Level of anxiety will decrease Outcome: Progressing   Problem: Elimination: Goal: Will not experience complications related to bowel motility Outcome: Progressing Goal: Will not experience complications related to urinary retention Outcome: Progressing   Problem: Pain Managment: Goal: General experience of comfort will improve Outcome: Progressing   Problem: Safety: Goal: Ability to remain free from injury will improve Outcome: Progressing   Problem: Skin Integrity: Goal: Risk for impaired skin integrity will decrease Outcome: Progressing   Problem: Education: Goal: Verbalization of understanding the information provided (i.e., activity precautions, restrictions, etc) will improve Outcome: Progressing   Problem: Activity: Goal: Ability to ambulate and perform ADLs will improve Outcome: Progressing   Problem: Clinical Measurements: Goal: Postoperative complications will be avoided or minimized Outcome: Progressing   Problem: Self-Concept: Goal: Ability to maintain and perform role responsibilities to the fullest extent possible will improve Outcome: Progressing   Problem: Pain Management: Goal: Pain level will decrease Outcome: Progressing   

## 2018-02-09 NOTE — Progress Notes (Signed)
Patient ID: Stephanie Reyes, female   DOB: [birth year], 84 y.o.   MRN: [id number]                                                                PROGRESS NOTE                                                                                                                                                                                                             Patient Demographics:    Stephanie Reyes, is a 84 y.o. female, DOB - 1924/04/17, WUJ:[id number]  Admit date - 02/07/2018   Admitting Physician Pearson Grippe, MD  Outpatient Primary MD for the patient is Shaune Pollack, MD  LOS - 2  Outpatient Specialists:      Chief Complaint  Patient presents with  . Fall       Brief Narrative     84 y.o. female, [name], [name]  [name]   In ED,  Left Tib/Fib IMPRESSION: 1. No acute fracture or dislocation identified. 2. Osteoarthrosis of the knee with chondrocalcinosis.  Left hip  IMPRESSION: 1. Acute, impacted fracture of the left femoral neck.  K 2.8, Bun 16, Creatinine 0.84 Wbc 12.5, hgb 14.7, Plt 206 INR 1.03  Orthopedics consulted by ED, and will be by in Am.  (requested admission to [facility])  Pt will be admitted for hypokalemia, hyperglycemia, and left femoral neck fracture.       Subjective:    Stephanie Reyes today is awaiting surgery.    No headache, No chest pain, No abdominal pain - No Nausea, No new weakness tingling or numbness, No Cough - SOB.    Assessment  & Plan :    Principal Problem:   Left displaced femoral neck fracture (HCC) Active Problems:   Hypokalemia   AF (paroxysmal atrial fibrillation) (HCC)   Femoral neck fracture (HCC)   Left femoral neck fracture: Currently n.p.o. After MN  Orthopedics following and planning for left hemiarthroplasty.  PT/OT will be ordered after the procedure. Continue pain management. Patient lives by herself and ambulates  without the help of walker or cane on baseline.  Hypokalemia: Supplemented and corrected.  Hypomagnesemia: Supplemented ,we will check the levels tomorrow.  Paroxysmal  A. fib: Currently rate is controlled.   Cont metoprolol.  Not on anticoagulation at home   Hypertension: Currently blood pressure slightly on the lower side.  We will continue to monitor blood pressure.  HOLD amlodipine and chlorthalidone since 4/5     Code Status : FULL CODE  Family Communication  : w patient  Disposition Plan  :  TBD  Barriers For Discharge :   Consults  :  orthopedics  Procedures  :     DVT Prophylaxis  :  Lovenox -SCDs  Lab Results  Component Value Date   PLT 173 02/08/2018    Antibiotics  :    Anti-infectives (From admission, onward)   Start     Dose/Rate Route Frequency Ordered Stop   02/09/18 0600  ceFAZolin (ANCEF) IVPB 2g/100 mL premix     2 g 200 mL/hr over 30 Minutes Intravenous On call to O.R. 02/08/18 2011 02/10/18 0559   02/09/18 0600  vancomycin (VANCOCIN) IVPB 1000 mg/200 mL premix     1,000 mg 200 mL/hr over 60 Minutes Intravenous On call to O.R. 02/08/18 2011 02/10/18 0559        Objective:   Vitals:   02/08/18 1500 02/08/18 1600 02/08/18 2110 02/09/18 0544  BP: (!) 104/58 (!) 93/58 99/68 116/79  Pulse: 71 (!) 59 90 77  Resp: 16 (!) 21    Temp:   98.7 F (37.1 C) 98.8 F (37.1 C)  TempSrc:   Oral Oral  SpO2: 95% 96% 95% 95%    Wt Readings from Last 3 Encounters:  11/16/17 56.6 kg (124 lb 12.8 oz)  04/26/17 54.5 kg (120 lb 3.2 oz)  03/13/17 54.7 kg (120 lb 9.6 oz)     Intake/Output Summary (Last 24 hours) at 02/09/2018 0904 Last data filed at 02/08/2018 1415 Gross per 24 hour  Intake 935 ml  Output -  Net 935 ml     Physical Exam  Awake Alert, Oriented X 3, No new F.N deficits, Normal affect [place].AT,PERRAL Supple Neck,No JVD, No cervical lymphadenopathy appriciated.  Symmetrical Chest wall movement, Good air movement bilaterally,  CTAB RRR,No Gallops,Rubs or new Murmurs, No Parasternal Heave +ve B.Sounds, Abd Soft, No tenderness, No organomegaly appriciated, No rebound - guarding or rigidity. No Cyanosis, Clubbing or edema, No new Rash or bruise     Data Review:    CBC Recent Labs  Lab 02/07/18 1955 02/08/18 0514  WBC 12.5* 8.6  HGB 14.7 13.3  HCT 41.9 38.4  PLT 206 173  MCV 96.5 97.5  MCH 33.9 33.8  MCHC 35.1 34.6  RDW 12.6 12.7  LYMPHSABS 0.9  --   MONOABS 0.5  --   EOSABS 0.0  --   BASOSABS 0.0  --     Chemistries  Recent Labs  Lab 02/07/18 1955 02/07/18 2036 02/08/18 0514  NA 136  --  135  K 2.8*  --  3.5  CL 99*  --  100*  CO2 25  --  27  GLUCOSE 186*  --  143*  BUN 16  --  16  CREATININE 0.84  --  0.83  CALCIUM 10.5*  --  9.3  MG  --  1.5* 1.4*  AST  --   --  24  ALT  --   --  15  ALKPHOS  --   --  46  BILITOT  --   --  0.9   ------------------------------------------------------------------------------------------------------------------ No results for input(s): CHOL, HDL, LDLCALC, TRIG, CHOLHDL, LDLDIRECT in the last 72  hours.  No results found for: HGBA1C ------------------------------------------------------------------------------------------------------------------ No results for input(s): TSH, T4TOTAL, T3FREE, THYROIDAB in the last 72 hours.  Invalid input(s): FREET3 ------------------------------------------------------------------------------------------------------------------ No results for input(s): VITAMINB12, FOLATE, FERRITIN, TIBC, IRON, RETICCTPCT in the last 72 hours.  Coagulation profile Recent Labs  Lab 02/07/18 1955  INR 1.03    No results for input(s): DDIMER in the last 72 hours.  Cardiac Enzymes No results for input(s): CKMB, TROPONINI, MYOGLOBIN in the last 168 hours.  Invalid input(s): CK ------------------------------------------------------------------------------------------------------------------ No results found for: BNP  Inpatient  Medications  Scheduled Meds: . chlorhexidine  60 mL Topical Once  . chlorhexidine  60 mL Topical Once  . enoxaparin (LOVENOX) injection  40 mg Subcutaneous QHS  . metoprolol tartrate  12.5 mg Oral BID  . potassium chloride  40 mEq Oral Once  . povidone-iodine  2 application Topical Once  . povidone-iodine  2 application Topical Once   Continuous Infusions: . acetaminophen    .  ceFAZolin (ANCEF) IV    . tranexamic acid    . vancomycin     PRN Meds:.acetaminophen **OR** acetaminophen, HYDROmorphone (DILAUDID) injection, ondansetron (ZOFRAN) IV  Micro Results Recent Results (from the past 240 hour(s))  Surgical pcr screen     Status: None   Collection Time: 02/08/18 10:05 PM  Result Value Ref Range Status   MRSA, PCR NEGATIVE NEGATIVE Final   Staphylococcus aureus NEGATIVE NEGATIVE Final    Comment: (NOTE) The Xpert SA Assay (FDA approved for NASAL specimens in patients 17 years of age and older), is one component of a comprehensive surveillance program. It is not intended to diagnose infection nor to guide or monitor treatment. Performed at [facility], [address], [place], [place] [address]     Radiology Reports Dg Tibia/fibula Left  Result Date: 02/07/2018 CLINICAL DATA:  83 y/o  F; fall with proximal lower leg pain. EXAM: LEFT TIBIA AND FIBULA - 2 VIEW COMPARISON:  None. FINDINGS: Mild narrowing of the femorotibial compartments of the knee. Chondrocalcinosis of knee joint. No acute fracture or dislocation identified. Vascular calcifications and soft tissues. IMPRESSION: 1. No acute fracture or dislocation identified. 2. Osteoarthrosis of the knee with chondrocalcinosis. Electronically Signed   By: Mitzi Hansen M.D.   On: 02/07/2018 18:06   Ct Hip Left Wo Contrast  Result Date: 02/07/2018 CLINICAL DATA:  Left hip pain after a fall today. Fracture demonstrated on plain radiographs. EXAM: CT OF THE LEFT HIP WITHOUT CONTRAST TECHNIQUE: Multidetector CT  imaging of the left hip was performed according to the standard protocol. Multiplanar CT image reconstructions were also generated. COMPARISON:  Left hip 02/07/2018 FINDINGS: Bones/Joint/Cartilage Mostly transverse impacted fracture of the subcapital left femoral neck. There is valgus angulation of the left hip. No dislocation. Tiny displaced comminuted fragments are demonstrated. No evidence of inter trochanteric involvement. Visualize acetabulum appears intact. Old appearing deformity of the inferior pubic ramus. Degenerative changes in the symphysis pubis. Ligaments Suboptimally assessed by CT. Muscles and Tendons No intramuscular hematoma. Mild fatty atrophy of the gluteal muscles. Soft tissues No significant soft tissue hematoma or infiltration. Vascular calcifications noted. IMPRESSION: Transverse impacted fracture of the subcapital left femoral neck with valgus angulation. Electronically Signed   By: Burman Nieves M.D.   On: 02/07/2018 23:16   Dg Hip Unilat With Pelvis 2-3 Views Left  Result Date: 02/07/2018 CLINICAL DATA:  Left hip pain after fall. EXAM: DG HIP (WITH OR WITHOUT PELVIS) 2-3V LEFT COMPARISON:  None. FINDINGS: Acute, impacted fracture of  the left femoral neck. No dislocation. Mild bilateral hip osteoarthritis. The pubic symphysis and sacroiliac joints are intact. Osteopenia. Soft tissues are unremarkable. IMPRESSION: 1. Acute, impacted fracture of the left femoral neck. Electronically Signed   By: Obie DredgeWilliam T Derry M.D.   On: 02/07/2018 18:05    Time Spent in minutes  30   Pearson GrippeJames Yuridiana Formanek M.D on 02/09/2018 at 9:04 AM  Between 7am to 7pm - Pager - [phone]  After 7pm go to [email or web address] - password [email or web address]  [facility] -  Office  [phone]

## 2018-02-10 DIAGNOSIS — I491 Atrial premature depolarization: Secondary | ICD-10-CM

## 2018-02-10 DIAGNOSIS — E876 Hypokalemia: Secondary | ICD-10-CM

## 2018-02-10 LAB — COMPREHENSIVE METABOLIC PANEL
ALT: 15 U/L (ref 14–54)
ANION GAP: 11 (ref 5–15)
AST: 30 U/L (ref 15–41)
Albumin: 2.9 g/dL — ABNORMAL LOW (ref 3.5–5.0)
Alkaline Phosphatase: 43 U/L (ref 38–126)
BUN: 20 mg/dL (ref 6–20)
CO2: 23 mmol/L (ref 22–32)
CREATININE: 0.95 mg/dL (ref 0.44–1.00)
Calcium: 8.2 mg/dL — ABNORMAL LOW (ref 8.9–10.3)
Chloride: 96 mmol/L — ABNORMAL LOW (ref 101–111)
GFR, EST AFRICAN AMERICAN: 58 mL/min — AB (ref >60)
GFR, EST NON AFRICAN AMERICAN: 50 mL/min — AB (ref >60)
Glucose, Bld: 158 mg/dL — ABNORMAL HIGH (ref 65–99)
Potassium: 3.3 mmol/L — ABNORMAL LOW (ref 3.5–5.1)
SODIUM: 130 mmol/L — AB (ref 135–145)
Total Bilirubin: 0.9 mg/dL (ref 0.3–1.2)
Total Protein: 5.6 g/dL — ABNORMAL LOW (ref 6.5–8.1)

## 2018-02-10 LAB — CBC
HCT: 34 % — ABNORMAL LOW (ref 36.0–46.0)
Hemoglobin: 11.5 g/dL — ABNORMAL LOW (ref 12.0–15.0)
MCH: 32.5 pg (ref 26.0–34.0)
MCHC: 33.8 g/dL (ref 30.0–36.0)
MCV: 96 fL (ref 78.0–100.0)
PLATELETS: 129 10*3/uL — AB (ref 150–400)
RBC: 3.54 MIL/uL — AB (ref 3.87–5.11)
RDW: 12.5 % (ref 11.5–15.5)
WBC: 7 10*3/uL (ref 4.0–10.5)

## 2018-02-10 LAB — GLUCOSE, CAPILLARY: GLUCOSE-CAPILLARY: 169 mg/dL — AB (ref 65–99)

## 2018-02-10 LAB — MAGNESIUM: MAGNESIUM: 1.7 mg/dL (ref 1.7–2.4)

## 2018-02-10 MED ORDER — AMLODIPINE BESYLATE 5 MG PO TABS
5.0000 mg | ORAL_TABLET | Freq: Every day | ORAL | Status: DC
  Administered 2018-02-10: 5 mg via ORAL
  Filled 2018-02-10 (×2): qty 1

## 2018-02-10 MED ORDER — POTASSIUM CHLORIDE 20 MEQ/15ML (10%) PO SOLN
40.0000 meq | Freq: Once | ORAL | Status: AC
  Administered 2018-02-10: 40 meq via ORAL
  Filled 2018-02-10: qty 30

## 2018-02-10 NOTE — Plan of Care (Signed)
  Problem: Health Behavior/Discharge Planning: Goal: Ability to manage health-related needs will improve Outcome: Progressing   Problem: Clinical Measurements: Goal: Ability to maintain clinical measurements within normal limits will improve Outcome: Progressing Goal: Will remain free from infection Outcome: Progressing Goal: Diagnostic test results will improve Outcome: Progressing Goal: Respiratory complications will improve Outcome: Progressing Goal: Cardiovascular complication will be avoided Outcome: Progressing   Problem: Activity: Goal: Risk for activity intolerance will decrease Outcome: Progressing   Problem: Nutrition: Goal: Adequate nutrition will be maintained Outcome: Progressing   Problem: Coping: Goal: Level of anxiety will decrease Outcome: Progressing   Problem: Elimination: Goal: Will not experience complications related to bowel motility Outcome: Progressing Goal: Will not experience complications related to urinary retention Outcome: Progressing   Problem: Pain Managment: Goal: General experience of comfort will improve Outcome: Progressing   Problem: Safety: Goal: Ability to remain free from injury will improve Outcome: Progressing   Problem: Skin Integrity: Goal: Risk for impaired skin integrity will decrease Outcome: Progressing   Problem: Education: Goal: Verbalization of understanding the information provided (i.e., activity precautions, restrictions, etc) will improve Outcome: Progressing   Problem: Activity: Goal: Ability to ambulate and perform ADLs will improve Outcome: Progressing   Problem: Clinical Measurements: Goal: Postoperative complications will be avoided or minimized Outcome: Progressing   Problem: Self-Concept: Goal: Ability to maintain and perform role responsibilities to the fullest extent possible will improve Outcome: Progressing   Problem: Pain Management: Goal: Pain level will decrease Outcome: Progressing   

## 2018-02-10 NOTE — Progress Notes (Signed)
Rehab Admissions Coordinator Note:  Patient was screened by Clois DupesBoyette, Gerber Penza Godwin for appropriateness for an Inpatient Acute Rehab Consult per PT and OT recommendation. [facility] has [facility] Medicare. It is doubtful that insurance will approve CIR for this diagnosis. If pt and family would like to pursue CIR, please place order for consult and Rehab MD will assess if pt would be a candidate.Clois Dupes.  Nary Sneed Godwin 02/10/2018, 5:30 PM  I can be reached at [phone].

## 2018-02-10 NOTE — Evaluation (Signed)
Physical Therapy Evaluation Patient Details Name: Stephanie Reyes MRN: [id number] DOB: 08-22-1924 Today's Date: 02/10/2018   History of Present Illness  84 y.o. female with a displaced Left femoral neck fracture now s/p left hip hemiarthroplasty, anterior approach. PMH includes: Premature atrial contractions, syncope, HTN.  Clinical Impression   Patient is s/p above surgery resulting in functional limitations due to the deficits listed below (see PT Problem List). Stephanie Reyes was independent and active prior to this admission, often helping her neighbors; Good work during today's session, and she demonstrates excellent motivation to regain her independence; REcommending CIR say for post-acute rehab to maximize independence and safety with mobility prior to going home;  Patient will benefit from skilled PT to increase their independence and safety with mobility to allow discharge to the venue listed below.       Follow Up Recommendations CIR    Equipment Recommendations  Rolling walker with 5" wheels;3in1 (PT)    Recommendations for Other Services Rehab consult     Precautions / Restrictions Precautions Precautions: Fall Precaution Comments: Desatted on Room Air Restrictions Weight Bearing Restrictions: Yes LLE Weight Bearing: Weight bearing as tolerated Other Position/Activity Restrictions: WBAT      Mobility  Bed Mobility Overal bed mobility: Needs Assistance Bed Mobility: Supine to Sit     Supine to sit: HOB elevated;Mod assist     General bed mobility comments: Some assist to advance L hip/LLE into full EOB position. Assist also to power up trunk.   Transfers Overall transfer level: Needs assistance Equipment used: Rolling walker (2 wheeled) Transfers: Sit to/from Stand Sit to Stand: Mod assist;From elevated surface         General transfer comment: Mod A to power up and steady. Cues for hand placement and technique with rw. +2 utilized on initial stand from  EOB, +1 the remainder of the session.   Ambulation/Gait Ambulation/Gait assistance: Min assist;+2 safety/equipment Ambulation Distance (Feet): 5 Feet Assistive device: Rolling walker (2 wheeled) Gait Pattern/deviations: Step-to pattern;Shuffle     General Gait Details: Cues for stepping and sequence; good suport of self on RW to unweigh painful LLE for R stepping  Stairs            Wheelchair Mobility    Modified Rankin (Stroke Patients Only)       Balance Overall balance assessment: Needs assistance Sitting-balance support: Bilateral upper extremity supported;Feet supported;No upper extremity supported Sitting balance-Leahy Scale: Fair Sitting balance - Comments: Some unsteadiness noted initially. Balance improved once placed back on 2L supplemental O2.    Standing balance support: Bilateral upper extremity supported;During functional activity Standing balance-Leahy Scale: Poor Standing balance comment: external support for balance                             Pertinent Vitals/Pain Pain Assessment: Faces Faces Pain Scale: Hurts little more Pain Location: LLE Pain Descriptors / Indicators: Grimacing;Sore Pain Intervention(s): Monitored during session    Home Living Family/patient expects to be discharged to:: Private residence Living Arrangements: Alone Available Help at Discharge: Family;Available PRN/intermittently Type of Home: Apartment Home Access: Level entry     Home Layout: One level Home Equipment: Shower seat      Prior Function Level of Independence: Independent         Comments: Pt states she has assist for driving and grocery shopping. Is very independent and involved with her 6 kids and community     Hand Dominance  Extremity/Trunk Assessment   Upper Extremity Assessment Upper Extremity Assessment: Defer to OT evaluation    Lower Extremity Assessment Lower Extremity Assessment: LLE deficits/detail LLE Deficits  / Details: Grossly decr aROM and strength L hip, limited by pain postop       Communication   Communication: HOH  Cognition Arousal/Alertness: Awake/alert Behavior During Therapy: WFL for tasks assessed/performed Overall Cognitive Status: Within Functional Limits for tasks assessed                                 General Comments: some higher level deficits observed. from home alone.       General Comments General comments (skin integrity, edema, etc.): Trialed pt on RA during bed mobility. O2 dropping to high 80s with corresponding symptoms (some increased confusion, unsteadiness). Placed and left back on 2L O2 with sats recovering to 95. Bp assessed once sitting up in recliner: 135/80, pulse 82.    Exercises     Assessment/Plan    PT Assessment Patient needs continued PT services  PT Problem List Decreased strength;Decreased range of motion;Decreased activity tolerance;Decreased balance;Decreased mobility;Decreased coordination;Decreased knowledge of use of DME;Decreased safety awareness;Decreased knowledge of precautions;Pain;Cardiopulmonary status limiting activity       PT Treatment Interventions DME instruction;Gait training;Stair training;Functional mobility training;Therapeutic activities;Therapeutic exercise;Balance training;Patient/family education    PT Goals (Current goals can be found in the Care Plan section)  Acute Rehab PT Goals Patient Stated Goal: regain independence PT Goal Formulation: With patient Time For Goal Achievement: 02/24/18 Potential to Achieve Goals: Good    Frequency Min 5X/week   Barriers to discharge        Co-evaluation PT/OT/SLP Co-Evaluation/Treatment: Yes Reason for Co-Treatment: For patient/therapist safety;To address functional/ADL transfers PT goals addressed during session: Mobility/safety with mobility OT goals addressed during session: ADL's and self-care       AM-PAC PT "6 Clicks" Daily Activity  Outcome  Measure Difficulty turning over in bed (including adjusting bedclothes, sheets and blankets)?: Unable Difficulty moving from lying on back to sitting on the side of the bed? : Unable Difficulty sitting down on and standing up from a chair with arms (e.g., wheelchair, bedside commode, etc,.)?: A Lot Help needed moving to and from a bed to chair (including a wheelchair)?: A Little Help needed walking in hospital room?: A Little Help needed climbing 3-5 steps with a railing? : A Lot 6 Click Score: 12    End of Session Equipment Utilized During Treatment: Gait belt Activity Tolerance: Patient tolerated treatment well Patient left: in chair;with call bell/phone within reach;with nursing/sitter in room Nurse Communication: Mobility status PT Visit Diagnosis: Unsteadiness on feet (R26.81);Pain Pain - Right/Left: Left Pain - part of body: Hip    Time: 0926-1018 PT Time Calculation (min) (ACUTE ONLY): 52 min   Charges:   PT Evaluation $PT Eval Moderate Complexity: 1 Mod PT Treatments $Therapeutic Activity: 8-22 mins   PT G Codes:        Van ClinesHolly Eberardo Demello, PT  Acute Rehabilitation Services Pager [phone] Office [phone]   Levi AlandHolly H Rylon Poitra 02/10/2018, 1:29 PM

## 2018-02-10 NOTE — Progress Notes (Signed)
PROGRESS NOTE    Stephanie Reyes  JXB:[id number] DOB: November 30, 1923 DOA: 02/07/2018 PCP: Shaune Pollack, MD   Brief Narrative:  HPI on 02/07/2018 by Dr. Pearson Grippe Stephanie Reyes  is a 84 y.o. female, [name], [name]  [name]   Interim history Admitted for left femoral neck fracture status post left hip arthroplasty on 02/09/2018.  PT and OT evaluation.  Assessment & Plan   Displaced left femoral neck fracture -Status post fall -Orthopedics consulted and appreciated, status post left hip hemiarthroplasty on 02/09/2018 by Dr. Linna Caprice -Continue pain control -At baseline, patient uses a walker or cane -Pending PT and OT evaluations  Premature atrial contractions/history of syncope -Patient follows with Dr. Rennis Golden, cardiology -Upon review of patient's chart patient last saw Dr. Rennis Golden on 11/16/2017.  Noted in her chart, she has intolerance to lisinopril as well as metoprolol.  Patient was placed on amlodipine for blood pressure control.  In that particular progress note, metoprolol 12.5 mg twice daily is listed as an outpatient medication prior to visit. -Will discontinue metoprolol and continue to monitor  Essential hypertension -On admission amlodipine and chlorthalidone were held -Start amlodipine today (with holding parameters) and discontinue metoprolol (see discussion as above)  Hypokalemia  -Potassium 3.3, will replace and continue to monitor BMP  Hypomagnesemia -Magnesium is noted to be 1.4 on 02/08/2018 and has not been checked since -Add a magnesium on to morning labs, if low will replace with IV supplementation  DVT Prophylaxis  lovenox  Code Status: DNR  Family Communication: None at bedside  Disposition Plan: Admitted. Pending PT/OT evaluations, suspect SNF at discharge  Consultants Orthopedic surgery, Dr. Linna Caprice  Procedures  Left hip hemiarthroplasty, anterior  approach  Antibiotics   Anti-infectives (From admission, onward)   Start     Dose/Rate Route Frequency Ordered Stop   02/10/18 0100  vancomycin (VANCOCIN) IVPB 1000 mg/200 mL premix     1,000 mg 200 mL/hr over 60 Minutes Intravenous Every 12 hours 02/09/18 1827 02/10/18 0103   02/09/18 0600  ceFAZolin (ANCEF) IVPB 2g/100 mL premix     2 g 200 mL/hr over 30 Minutes Intravenous On call to O.R. 02/08/18 2011 02/09/18 1435   02/09/18 0600  vancomycin (VANCOCIN) IVPB 1000 mg/200 mL premix     1,000 mg 200 mL/hr over 60 Minutes Intravenous On call to O.R. 02/08/18 2011 02/09/18 1354      Subjective:   Stephanie Reyes seen and examined today.  Complains about not have her personal toiletries and her current breakfast- states she had "teeth problems". Denies current chest pain, shortness of breath, abdominal pain, N/V/D/C. States she does not feel that she has been receiving the right medications while in the hospital.   Objective:   Vitals:   02/09/18 1833 02/09/18 2115 02/10/18 0006 02/10/18 0601  BP: 105/67 95/70 99/61  101/66  Pulse: 80 93 (!) 50 (!) 54  Resp: 19 18 16 16   Temp: 97.6 F (36.4 C) 98.2 F (36.8 C) (!) 97.4 F (36.3 C) (!) 97.5 F (36.4 C)  TempSrc: Oral Oral Oral Oral  SpO2: (!) 86% 95% 98% 100%  Weight:    64.3 kg (141 lb 12.1 oz)    Intake/Output Summary (Last 24 hours) at 02/10/2018 1211 Last data filed at 02/10/2018 0900 Gross per 24 hour  Intake 2759.7 ml  Output 1000 ml  Net 1759.7 ml   Filed Weights   02/10/18 0601  Weight:  64.3 kg (141 lb 12.1 oz)    Exam  General: Well developed, thin, elderly, NAD  HEENT: NCAT, mucous membranes moist.   Neck: Supple  Cardiovascular: S1 S2 auscultated, RRR, no murmur  Respiratory: Clear to auscultation bilaterally with equal chest rise  Abdomen: Soft, nontender, nondistended, + bowel sounds  Extremities: warm dry without cyanosis clubbing or edema  Neuro: AAOx3, nonfocal  Psych: Anxious, however,  appropriate   Data Reviewed: I have personally reviewed following labs and imaging studies  CBC: Recent Labs  Lab 02/07/18 1955 02/08/18 0514 02/09/18 1903 02/10/18 0901  WBC 12.5* 8.6 7.9 7.0  NEUTROABS 11.0*  --   --   --   HGB 14.7 13.3 12.4 11.5*  HCT 41.9 38.4 36.8 34.0*  MCV 96.5 97.5 97.4 96.0  PLT 206 173 127* 129*   Basic Metabolic Panel: Recent Labs  Lab 02/07/18 1955 02/07/18 2036 02/08/18 0514 02/09/18 1903 02/10/18 0901  NA 136  --  135  --  130*  K 2.8*  --  3.5  --  3.3*  CL 99*  --  100*  --  96*  CO2 25  --  27  --  23  GLUCOSE 186*  --  143*  --  158*  BUN 16  --  16  --  20  CREATININE 0.84  --  0.83 0.86 0.95  CALCIUM 10.5*  --  9.3  --  8.2*  MG  --  1.5* 1.4*  --   --    GFR: Estimated Creatinine Clearance: 32.6 mL/min (by C-G formula based on SCr of 0.95 mg/dL). Liver Function Tests: Recent Labs  Lab 02/08/18 0514 02/10/18 0901  AST 24 30  ALT 15 15  ALKPHOS 46 43  BILITOT 0.9 0.9  PROT 6.0* 5.6*  ALBUMIN 3.3* 2.9*   No results for input(s): LIPASE, AMYLASE in the last 168 hours. No results for input(s): AMMONIA in the last 168 hours. Coagulation Profile: Recent Labs  Lab 02/07/18 1955  INR 1.03   Cardiac Enzymes: No results for input(s): CKTOTAL, CKMB, CKMBINDEX, TROPONINI in the last 168 hours. BNP (last 3 results) No results for input(s): PROBNP in the last 8760 hours. HbA1C: No results for input(s): HGBA1C in the last 72 hours. CBG: Recent Labs  Lab 02/10/18 1137  GLUCAP 169*   Lipid Profile: No results for input(s): CHOL, HDL, LDLCALC, TRIG, CHOLHDL, LDLDIRECT in the last 72 hours. Thyroid Function Tests: No results for input(s): TSH, T4TOTAL, FREET4, T3FREE, THYROIDAB in the last 72 hours. Anemia Panel: No results for input(s): VITAMINB12, FOLATE, FERRITIN, TIBC, IRON, RETICCTPCT in the last 72 hours. Urine analysis:    Component Value Date/Time   COLORURINE YELLOW 02/07/2018 2228   APPEARANCEUR CLOUDY (A)  02/07/2018 2228   LABSPEC 1.011 02/07/2018 2228   PHURINE 8.0 02/07/2018 2228   GLUCOSEU NEGATIVE 02/07/2018 2228   HGBUR NEGATIVE 02/07/2018 2228   BILIRUBINUR NEGATIVE 02/07/2018 2228   KETONESUR NEGATIVE 02/07/2018 2228   PROTEINUR NEGATIVE 02/07/2018 2228   NITRITE NEGATIVE 02/07/2018 2228   LEUKOCYTESUR NEGATIVE 02/07/2018 2228   Sepsis Labs: @LABRCNTIP (procalcitonin:4,lacticidven:4)  ) Recent Results (from the past 240 hour(s))  Surgical pcr screen     Status: None   Collection Time: 02/08/18 10:05 PM  Result Value Ref Range Status   MRSA, PCR NEGATIVE NEGATIVE Final   Staphylococcus aureus NEGATIVE NEGATIVE Final    Comment: (NOTE) The Xpert SA Assay (FDA approved for NASAL specimens in patients 82 years of age and older), is  one component of a comprehensive surveillance program. It is not intended to diagnose infection nor to guide or monitor treatment. Performed at [facility], [address], [place], [place] [address]       Radiology Studies: Pelvis Portable  Result Date: 02/09/2018 CLINICAL DATA:  Status post hip replacement EXAM: PORTABLE PELVIS 1-2 VIEWS COMPARISON:  None. FINDINGS: Patient is status post left hip replacement. Acetabular and femoral components are in good position. IMPRESSION: Status post left hip replacement Electronically Signed   By: Gerome Sam III M.D   On: 02/09/2018 16:50   Dg C-arm 1-60 Min  Result Date: 02/09/2018 CLINICAL DATA:  Status post left hip replacement. FLUOROSCOPY TIME:  7 seconds. Images: 2 EXAM: OPERATIVE LEFT HIP (WITH PELVIS IF PERFORMED) 2 VIEWS TECHNIQUE: Fluoroscopic spot image(s) were submitted for interpretation post-operatively. COMPARISON:  None. FINDINGS: The patient is status post left hip replacement. The acetabular and femoral components are in good position. IMPRESSION: Left hip replacement as above. Electronically Signed   By: Gerome Sam III M.D   On: 02/09/2018 17:33   Dg Hip Operative Unilat  With Pelvis Left  Result Date: 02/09/2018 CLINICAL DATA:  Status post left hip replacement. FLUOROSCOPY TIME:  7 seconds. Images: 2 EXAM: OPERATIVE LEFT HIP (WITH PELVIS IF PERFORMED) 2 VIEWS TECHNIQUE: Fluoroscopic spot image(s) were submitted for interpretation post-operatively. COMPARISON:  None. FINDINGS: The patient is status post left hip replacement. The acetabular and femoral components are in good position. IMPRESSION: Left hip replacement as above. Electronically Signed   By: Gerome Sam III M.D   On: 02/09/2018 17:33     Scheduled Meds: . amLODipine  5 mg Oral Daily  . docusate sodium  100 mg Oral BID  . enoxaparin (LOVENOX) injection  40 mg Subcutaneous Q24H  . potassium chloride  40 mEq Oral Once   Continuous Infusions: . lactated ringers 10 mL/hr at 02/09/18 1151     LOS: 3 days   Time Spent in minutes   45 minutes (more than 50% spent with patient face to face as well as additional time spent reviewing her chart and outpatient records)  [facility] on 02/10/2018 at 12:11 PM  Between 7am to 7pm - Pager - [phone]  After 7pm go to [email or web address] - password [email or web address]  And look for the night coverage person covering for me after hours  [facility] Office  [phone]

## 2018-02-10 NOTE — Evaluation (Signed)
Occupational Therapy Evaluation Patient Details Name: Stephanie Reyes MRN: [id number] DOB: 07/27/1924 Today's Date: 02/10/2018    History of Present Illness 84 y.o. female with a displaced Left femoral neck fracture now s/p left hip hemiarthroplasty, anterior approach. PMH includes: Premature atrial contractions, syncope, HTN.   Clinical Impression   Pt admitted with the above diagnoses and presents with below problem list. Pt will benefit from continued acute OT to address the below listed deficits and maximize independence with basic ADLs prior to d/c to next venue. PTA pt was independent with ADLs, lives alone. Pt reports she has a son who lives locally. Daughter [name]) present at end of session. Pt very pleasant and eager to work with therapy. Currently pt is mod A for bed mobility, +1 mod A for SPT transfers and pivotal steps. +2 assist was utilized on first time standing but pt quickly progressed to +1 assist level. For LB ADLs pt currently mod- max +1 assist.  Given pt's baseline independence and performance with therapy today, feel that pt could be a great candidate for CIR prior to returning home.      Follow Up Recommendations  CIR    Equipment Recommendations  Other (comment)(defer to next venue)    Recommendations for Other Services       Precautions / Restrictions Precautions Precautions: Anterior Hip;Fall Restrictions Weight Bearing Restrictions: Yes Other Position/Activity Restrictions: WBAT      Mobility Bed Mobility Overal bed mobility: Needs Assistance Bed Mobility: Supine to Sit     Supine to sit: HOB elevated;Mod assist     General bed mobility comments: Some assist to advance L hip/LLE into full EOB position. Assist also to power up trunk.   Transfers Overall transfer level: Needs assistance Equipment used: Rolling walker (2 wheeled) Transfers: Sit to/from Stand Sit to Stand: Mod assist;From elevated surface         General transfer comment:  Mod A to power up and steady. Cues for hand placement and technique with rw. +2 utilized on initial stand from EOB, +1 the remainder of the session.     Balance Overall balance assessment: Needs assistance Sitting-balance support: Bilateral upper extremity supported;Feet supported;No upper extremity supported Sitting balance-Leahy Scale: Fair Sitting balance - Comments: Some unsteadiness noted initially. Balance improved once placed back on 2L supplemental O2.    Standing balance support: Bilateral upper extremity supported;During functional activity Standing balance-Leahy Scale: Poor Standing balance comment: external support for balance                           ADL either performed or assessed with clinical judgement   ADL Overall ADL's : Needs assistance/impaired Eating/Feeding: Set up;Sitting   Grooming: Set up;Sitting   Upper Body Bathing: Set up;Sitting   Lower Body Bathing: Moderate assistance;Sit to/from stand   Upper Body Dressing : Set up;Sitting   Lower Body Dressing: Moderate assistance;Sit to/from stand   Toilet Transfer: Moderate assistance;Stand-pivot;BSC;RW   Toileting- Clothing Manipulation and Hygiene: Moderate assistance;Sit to/from stand   Tub/ Shower Transfer: Tub Metallurgist Details (indicate cue type and reason): would need tub bench. unable to step over tub wall at this time Functional mobility during ADLs: Moderate assistance;Rolling walker(pivotol steps) General ADL Comments: Pt completed bed mobility, sat EOB a few minutes then SPT to BSC followed by pivotol steps to recliner.      Vision         Perception     Praxis  Pertinent Vitals/Pain Pain Assessment: Faces Faces Pain Scale: Hurts little more Pain Location: LLE Pain Descriptors / Indicators: Grimacing;Sore Pain Intervention(s): Limited activity within patient's tolerance;Monitored during session;Repositioned     Hand Dominance      Extremity/Trunk Assessment Upper Extremity Assessment Upper Extremity Assessment: Generalized weakness   Lower Extremity Assessment Lower Extremity Assessment: Defer to PT evaluation       Communication Communication Communication: HOH   Cognition Arousal/Alertness: Awake/alert Behavior During Therapy: WFL for tasks assessed/performed Overall Cognitive Status: Within Functional Limits for tasks assessed                                 General Comments: some higher level deficits observed. from home alone.    General Comments  Trialed pt on RA during bed mobility. O2 dropping to high 80s with corresponding symptoms (some increased confusion, unsteadiness). Placed and left back on 2L O2 with sats recovering to 95. Bp assessed once sitting up in recliner: 135/80, pulse 82.    Exercises     Shoulder Instructions      Home Living Family/patient expects to be discharged to:: Private residence Living Arrangements: Alone Available Help at Discharge: Family;Available PRN/intermittently Type of Home: Apartment Home Access: Level entry     Home Layout: One level     Bathroom Shower/Tub: Chief Strategy Officer: Handicapped height     Home Equipment: Shower seat          Prior Functioning/Environment Level of Independence: Independent                 OT Problem List: Impaired balance (sitting and/or standing);Decreased knowledge of use of DME or AE;Decreased knowledge of precautions;Pain      OT Treatment/Interventions: Self-care/ADL training;DME and/or AE instruction;Therapeutic activities;Patient/family education;Balance training    OT Goals(Current goals can be found in the care plan section) Acute Rehab OT Goals Patient Stated Goal: regain independence OT Goal Formulation: With patient/family Time For Goal Achievement: 02/24/18 Potential to Achieve Goals: Good ADL Goals Pt Will Perform Grooming: with min guard assist;standing Pt  Will Perform Lower Body Bathing: sit to/from stand;with adaptive equipment;with supervision Pt Will Perform Lower Body Dressing: sit to/from stand;with adaptive equipment;with supervision Pt Will Transfer to Toilet: ambulating;with supervision Pt Will Perform Toileting - Clothing Manipulation and hygiene: sit to/from stand;with supervision Additional ADL Goal #1: Pt will be supervision level with bed mobility to prepare for OOB ADLs.  OT Frequency: Min 3X/week   Barriers to D/C:            Co-evaluation PT/OT/SLP Co-Evaluation/Treatment: Yes Reason for Co-Treatment: To address functional/ADL transfers;For patient/therapist safety   OT goals addressed during session: ADL's and self-care      AM-PAC PT "6 Clicks" Daily Activity     Outcome Measure Help from another person eating meals?: None Help from another person taking care of personal grooming?: None Help from another person toileting, which includes using toliet, bedpan, or urinal?: A Lot Help from another person bathing (including washing, rinsing, drying)?: A Lot Help from another person to put on and taking off regular upper body clothing?: A Little Help from another person to put on and taking off regular lower body clothing?: A Lot 6 Click Score: 17   End of Session Equipment Utilized During Treatment: Gait belt;Rolling walker;Oxygen Nurse Communication: Mobility status  Activity Tolerance: Patient tolerated treatment well Patient left: in chair;with call bell/phone within reach;with family/visitor present  OT Visit Diagnosis: Unsteadiness on feet (R26.81);Pain;History of falling (Z91.81) Pain - Right/Left: Left Pain - part of body: Hip                Time: [id number] OT Time Calculation (min): 52 min Charges:  OT General Charges $OT Visit: 1 Visit OT Evaluation $OT Eval Moderate Complexity: 1 Mod G-Codes:       Pilar GrammesMathews, Kassidy Frankson H 02/10/2018, 12:49 PM

## 2018-02-11 ENCOUNTER — Encounter (HOSPITAL_COMMUNITY): Payer: Self-pay | Admitting: Cardiology

## 2018-02-11 DIAGNOSIS — S72002A Fracture of unspecified part of neck of left femur, initial encounter for closed fracture: Secondary | ICD-10-CM

## 2018-02-11 DIAGNOSIS — I951 Orthostatic hypotension: Secondary | ICD-10-CM

## 2018-02-11 LAB — CBC
HEMATOCRIT: 31.6 % — AB (ref 36.0–46.0)
Hemoglobin: 11 g/dL — ABNORMAL LOW (ref 12.0–15.0)
MCH: 33.4 pg (ref 26.0–34.0)
MCHC: 34.8 g/dL (ref 30.0–36.0)
MCV: 96 fL (ref 78.0–100.0)
Platelets: 130 10*3/uL — ABNORMAL LOW (ref 150–400)
RBC: 3.29 MIL/uL — AB (ref 3.87–5.11)
RDW: 12.8 % (ref 11.5–15.5)
WBC: 5.7 10*3/uL (ref 4.0–10.5)

## 2018-02-11 LAB — BASIC METABOLIC PANEL
ANION GAP: 8 (ref 5–15)
BUN: 16 mg/dL (ref 6–20)
CHLORIDE: 101 mmol/L (ref 101–111)
CO2: 26 mmol/L (ref 22–32)
CREATININE: 0.78 mg/dL (ref 0.44–1.00)
Calcium: 8.1 mg/dL — ABNORMAL LOW (ref 8.9–10.3)
GFR calc Af Amer: >60 mL/min (ref >60)
GFR calc non Af Amer: >60 mL/min (ref >60)
Glucose, Bld: 84 mg/dL (ref 65–99)
Potassium: 3.4 mmol/L — ABNORMAL LOW (ref 3.5–5.1)
Sodium: 135 mmol/L (ref 135–145)

## 2018-02-11 LAB — MAGNESIUM: Magnesium: 1.7 mg/dL (ref 1.7–2.4)

## 2018-02-11 MED ORDER — MAGNESIUM SULFATE 2 GM/50ML IV SOLN
2.0000 g | Freq: Once | INTRAVENOUS | Status: AC
  Administered 2018-02-11: 2 g via INTRAVENOUS
  Filled 2018-02-11: qty 50

## 2018-02-11 MED ORDER — APIXABAN 2.5 MG PO TABS: 2.5000 mg | ORAL_TABLET | Freq: Two times a day (BID) | ORAL | 0 refills | Status: DC

## 2018-02-11 MED ORDER — POTASSIUM CHLORIDE 20 MEQ/15ML (10%) PO SOLN
40.0000 meq | Freq: Every day | ORAL | Status: DC
  Administered 2018-02-11 – 2018-02-12 (×2): 40 meq via ORAL
  Filled 2018-02-11 (×2): qty 30

## 2018-02-11 MED ORDER — TRAMADOL HCL 50 MG PO TABS: 50.0000 mg | ORAL_TABLET | Freq: Four times a day (QID) | ORAL | 0 refills | Status: DC | PRN

## 2018-02-11 MED ORDER — TRAMADOL HCL 50 MG PO TABS
50.0000 mg | ORAL_TABLET | Freq: Four times a day (QID) | ORAL | Status: DC | PRN
  Administered 2018-02-11 – 2018-02-12 (×4): 50 mg via ORAL
  Filled 2018-02-11 (×4): qty 1

## 2018-02-11 MED ORDER — SODIUM CHLORIDE 0.9 % IV BOLUS
500.0000 mL | Freq: Once | INTRAVENOUS | Status: AC
  Administered 2018-02-11: 500 mL via INTRAVENOUS

## 2018-02-11 NOTE — Progress Notes (Signed)
I met with pt and her daughter at bedside. We discussed goals and expectations of an inpt rehab admit. They prefer CIR rather than SNF. I explained that [employer] medicare will have to approve any venue. I have begun authorization for a possible admit tomorrow. Pt and daughter requesting Dr. Hilty, cardiology to be consulted for pt feels she had a syncopal episode that lead to her fall, not tripping. I have texted Dr. Mikhail with consult request by family. We will follow up tomorrow once insurance decision is made. [phone] 

## 2018-02-11 NOTE — Progress Notes (Signed)
Pt sitting on bsc, feeling very dizzy. States "im going to pass out"  bp checked 64/40. Hr 37.. Rapid response called. Started ns bolus 500 cc.  Placed pt back in bed. bp increased 108/60 hr. 87.. Pt AAOx4.  Notified MD.  Order for 12lead ekg and bolus .Marland Kitchen. Will cont to monitor.

## 2018-02-11 NOTE — Progress Notes (Signed)
   02/11/18 0910 02/11/18 0915 02/11/18 0920  Orthostatic Sitting  BP- Sitting 98/75 (!) 68/56 106/81  Pulse- Sitting 95 (Sitting EOB) 88 (sitting after standing 1 min; symptomatic for dizziness) 80 (reclined; near supine in recliner)    BPs taken during PT session;  Symptomatic for dizziness with attempt at ambulation, so sat down;   Full PT treatment note to follow;   Van ClinesHolly Geordie Nooney, PT  Acute Rehabilitation Services Pager [phone] Office [phone]

## 2018-02-11 NOTE — Care Management Important Message (Signed)
Important Message  Patient Details  Name: Stephanie Reyes MRN: [id number] Date of Birth: 02/16/1924   Medicare Important Message Given:  Yes    Dorena BodoIris Amoy Steeves 02/11/2018, 2:29 PM

## 2018-02-11 NOTE — NC FL2 (Signed)
[place] MEDICAID FL2 LEVEL OF CARE SCREENING TOOL     IDENTIFICATION  Patient Name: Stephanie Reyes Birthdate: Aug 31, 1924 Sex: female Admission Date (Current Location): 02/07/2018  [facility] [place]   Facility and Address:  [facility], [address], [place], [place] [address]      Provider Number: [id number]  Attending Physician Name and Address:  Edsel PetrinMikhail, Maryann, DO  Relative Name and Phone Number:  Rocky LinkKen, son, [phone]    Current Level of Care: Hospital Recommended Level of Care: Skilled Nursing Facility Prior Approval Number:    Date Approved/Denied:   PASRR Number: [id number]  Discharge Plan: SNF    Current Diagnoses: Patient Active Problem List   Diagnosis Date Noted  . Displaced fracture of left femoral neck (HCC) 02/09/2018  . Left displaced femoral neck fracture (HCC) 02/07/2018  . Femoral neck fracture (HCC) 02/07/2018  . Ascending aortic aneurysm (HCC) 04/26/2017  . Hypokalemia 02/27/2017  . Benign essential HTN 02/27/2017  . Renal insufficiency 02/27/2017  . AF (paroxysmal atrial fibrillation) (HCC) 02/27/2017  . Sinus tachycardia 02/27/2017  . Syncope and collapse 02/26/2017    Orientation RESPIRATION BLADDER Height & Weight     Self, Situation, Place, Time  O2(Nasal cannula 2L) Incontinent Weight: 64.3 kg (141 lb 12.1 oz) Height:     BEHAVIORAL SYMPTOMS/MOOD NEUROLOGICAL BOWEL NUTRITION STATUS      Continent Diet(Please see DC Summary)  AMBULATORY STATUS COMMUNICATION OF NEEDS Skin   Limited Assist Verbally Surgical wounds(Closed incision on hip)                       Personal Care Assistance Level of Assistance  Bathing, Feeding, Dressing Bathing Assistance: Maximum assistance Feeding assistance: Limited assistance Dressing Assistance: Limited assistance     Functional Limitations Info  Sight, Hearing, Speech Sight Info: Adequate Hearing Info: Adequate Speech Info: Adequate     SPECIAL CARE FACTORS FREQUENCY  PT (By licensed PT), OT (By licensed OT)     PT Frequency: 5x/week OT Frequency: 3x/week            Contractures      Additional Factors Info  Code Status, Allergies Code Status Info: DNR Allergies Info: Ampicillin, Aspirin, Eggs Or Egg-derived Products, Penicillins           Current Medications (02/11/2018):  This is the current hospital active medication list Current Facility-Administered Medications  Medication Dose Route Frequency Provider Last Rate Last Dose  . acetaminophen (TYLENOL) tablet 650 mg  650 mg Oral Q6H PRN Samson FredericSwinteck, Brian, MD   650 mg at 02/11/18 [id number]   Or  . acetaminophen (TYLENOL) suppository 650 mg  650 mg Rectal Q6H PRN Swinteck, Arlys JohnBrian, MD      . docusate sodium (COLACE) capsule 100 mg  100 mg Oral BID Samson FredericSwinteck, Brian, MD   100 mg at 02/11/18 0954  . enoxaparin (LOVENOX) injection 40 mg  40 mg Subcutaneous Q24H Samson FredericSwinteck, Brian, MD   40 mg at 02/11/18 0954  . HYDROmorphone (DILAUDID) injection 0.5 mg  0.5 mg Intravenous Q3H PRN Samson FredericSwinteck, Brian, MD   0.5 mg at 02/10/18 1359  . lactated ringers infusion   Intravenous Continuous Samson FredericSwinteck, Brian, MD 10 mL/hr at 02/09/18 1151    . menthol-cetylpyridinium (CEPACOL) lozenge 3 mg  1 lozenge Oral PRN Swinteck, Arlys JohnBrian, MD       Or  . phenol (CHLORASEPTIC) mouth spray 1 spray  1 spray Mouth/Throat PRN Samson FredericSwinteck, Brian, MD      .  metoCLOPramide (REGLAN) tablet 5-10 mg  5-10 mg Oral Q8H PRN Swinteck, Arlys John, MD       Or  . metoCLOPramide (REGLAN) injection 5-10 mg  5-10 mg Intravenous Q8H PRN Swinteck, Arlys John, MD      . ondansetron (ZOFRAN) tablet 4 mg  4 mg Oral Q6H PRN Swinteck, Arlys John, MD       Or  . ondansetron (ZOFRAN) injection 4 mg  4 mg Intravenous Q6H PRN Samson Frederic, MD   4 mg at 02/11/18 1337  . potassium chloride 20 MEQ/15ML (10%) solution 40 mEq  40 mEq Oral Daily Mikhail, Nita Sells, DO      . traMADol Janean Sark) tablet 50 mg  50 mg Oral Q6H PRN Edsel Petrin, DO   50 mg  at 02/11/18 1610     Discharge Medications: Please see discharge summary for a list of discharge medications.  Relevant Imaging Results:  Relevant Lab Results:   Additional Information [id number]  Mearl Latin, LCSWA

## 2018-02-11 NOTE — Progress Notes (Addendum)
    Subjective:  Patient reports pain as moderate.  Denies N/V/CP/SOB. Became orthostatic this am with systolic BP 60s.  Objective:   VITALS:   Vitals:   02/10/18 0601 02/10/18 1300 02/10/18 2146 02/11/18 0541  BP: 101/66 121/76 130/82 129/78  Pulse: (!) 54 65 81 70  Resp: 16 16 20 18   Temp: (!) 97.5 F (36.4 C) (!) 97.4 F (36.3 C) 98.3 F (36.8 C) (!) 97.5 F (36.4 C)  TempSrc: Oral Oral Oral Oral  SpO2: 100% 95% 98% 96%  Weight: 64.3 kg (141 lb 12.1 oz)       NAD ABD soft Sensation intact distally Intact pulses distally Dorsiflexion/Plantar flexion intact Incision: dressing C/D/I Compartment soft   Lab Results  Component Value Date   WBC 5.7 02/11/2018   HGB 11.0 (L) 02/11/2018   HCT 31.6 (L) 02/11/2018   MCV 96.0 02/11/2018   PLT 130 (L) 02/11/2018   BMET    Component Value Date/Time   NA 135 02/11/2018 0631   NA 136 05/07/2017 1406   K 3.4 (L) 02/11/2018 0631   CL 101 02/11/2018 0631   CO2 26 02/11/2018 0631   GLUCOSE 84 02/11/2018 0631   BUN 16 02/11/2018 0631   BUN 19 05/07/2017 1406   CREATININE 0.78 02/11/2018 0631   CALCIUM 8.1 (L) 02/11/2018 0631   GFRNONAA >60 02/11/2018 0631   GFRAA >60 02/11/2018 0631     Assessment/Plan: 2 Days Post-Op   Principal Problem:   Left displaced femoral neck fracture (HCC) Active Problems:   Hypokalemia   AF (paroxysmal atrial fibrillation) (HCC)   Femoral neck fracture (HCC)   Displaced fracture of left femoral neck (HCC)   WBAT with walker DVT ppx: lovenox in house --> home on apixaban, SCDs, TEDS PO pain control: avoid narcotics PT/OT Orthostasis: hgb 11.0, good uop, will bolus 500 ccNS OTO Dispo: rec d/c home with HHPT when medically ready   Iline OvenBrian J Davionna Blacksher 02/11/2018, 9:46 AM   Samson FredericBrian Tahlor Berenguer, MD Cell [phone]

## 2018-02-11 NOTE — Progress Notes (Signed)
PROGRESS NOTE    Stephanie Reyes  GNF:[id number] DOB: 04/27/1924 DOA: 02/07/2018 PCP: Shaune Pollack, MD   Brief Narrative:  HPI on 02/07/2018 by Dr. Pearson Grippe Stephanie Reyes  is a 84 y.o. female, [name], [name]  [name]   Interim history Admitted for left femoral neck fracture status post left hip arthroplasty on 02/09/2018.  PT and OT evaluation.  Assessment & Plan   Displaced left femoral neck fracture -Status post fall -Orthopedics consulted and appreciated, status post left hip hemiarthroplasty on 02/09/2018 by Dr. Linna Caprice -Continue pain control; will add tramadol (patient does not like oxycodone) -At baseline, patient uses a walker or cane -PT recommended CIR -CIR consulted and feels patient may be a candidate, however pending insurance approval  Premature atrial contractions/history of syncope -Patient follows with Dr. Rennis Golden, cardiology -Upon review of patient's chart patient last saw Dr. Rennis Golden on 11/16/2017.  Noted in her chart, she has intolerance to lisinopril as well as metoprolol.  Patient was placed on amlodipine for blood pressure control.  In that particular progress note, metoprolol 12.5 mg twice daily is listed as an outpatient medication prior to visit. -Discontinued metoprolol and continue to monitor -Patient now states she feel she had a syncopal or near syncopal event prior to her fall -Family would like cardiology consulted  Essential hypertension/Hypotension -On admission amlodipine and chlorthalidone were held -Discontinued metoprolol (see discussion as above) -while patient was working with PT, her BP dropped to 68/56- patient was given a fluid bolus -Will continue to monitor BP  Hypokalemia  -Continue to replace and continue to monitor BMP  Hypomagnesemia -Magnesium improved to 1.7 -will continue to replace and monitor  DVT Prophylaxis  lovenox  Code Status:  DNR  Family Communication: Daughter at bedside  Disposition Plan: Admitted. Dispo pending  Consultants Orthopedic surgery, Dr. Linna Caprice  Procedures  Left hip hemiarthroplasty, anterior approach  Antibiotics   Anti-infectives (From admission, onward)   Start     Dose/Rate Route Frequency Ordered Stop   02/10/18 0100  vancomycin (VANCOCIN) IVPB 1000 mg/200 mL premix     1,000 mg 200 mL/hr over 60 Minutes Intravenous Every 12 hours 02/09/18 1827 02/10/18 0103   02/09/18 0600  ceFAZolin (ANCEF) IVPB 2g/100 mL premix     2 g 200 mL/hr over 30 Minutes Intravenous On call to O.R. 02/08/18 2011 02/09/18 1435   02/09/18 0600  vancomycin (VANCOCIN) IVPB 1000 mg/200 mL premix     1,000 mg 200 mL/hr over 60 Minutes Intravenous On call to O.R. 02/08/18 2011 02/09/18 1354      Subjective:   [name] seen and examined today.  States she feels bad this morning and has pain everywhere. Denies chest pain, shortness of breath, abdominal pain, nausea, vomiting, diarrhea, dizziness, headache. (Received a call later stating that after working with physical therapy, patient experienced dizziness and felt she had a near syncopal episode prior to her fall.)  Objective:   Vitals:   02/10/18 0601 02/10/18 1300 02/10/18 2146 02/11/18 0541  BP: 101/66 121/76 130/82 129/78  Pulse: (!) 54 65 81 70  Resp: 16 16 20 18   Temp: (!) 97.5 F (36.4 C) (!) 97.4 F (36.3 C) 98.3 F (36.8 C) (!) 97.5 F (36.4 C)  TempSrc: Oral Oral Oral Oral  SpO2: 100% 95% 98% 96%  Weight: 64.3 kg (141 lb 12.1 oz)       Intake/Output Summary (Last 24 hours)  at 02/11/2018 1327 Last data filed at 02/11/2018 1047 Gross per 24 hour  Intake 240 ml  Output 400 ml  Net -160 ml   Filed Weights   02/10/18 0601  Weight: 64.3 kg (141 lb 12.1 oz)   Exam  General: Well developed, thin, elderly, NAD  HEENT: NCAT, mucous membranes moist.   Neck: Supple  Cardiovascular: S1 S2 auscultated, no rubs, murmurs or gallops.  Regular rate and rhythm.  Respiratory: Clear to auscultation bilaterally with equal chest rise  Abdomen: Soft, nontender, nondistended, + bowel sounds  Extremities: warm dry without cyanosis clubbing or edema  Neuro: AAOx3, nonfocal  Psych: Anxious  Data Reviewed: I have personally reviewed following labs and imaging studies  CBC: Recent Labs  Lab 02/07/18 1955 02/08/18 0514 02/09/18 1903 02/10/18 0901 02/11/18 0631  WBC 12.5* 8.6 7.9 7.0 5.7  NEUTROABS 11.0*  --   --   --   --   HGB 14.7 13.3 12.4 11.5* 11.0*  HCT 41.9 38.4 36.8 34.0* 31.6*  MCV 96.5 97.5 97.4 96.0 96.0  PLT 206 173 127* 129* 130*   Basic Metabolic Panel: Recent Labs  Lab 02/07/18 1955 02/07/18 2036 02/08/18 0514 02/09/18 1903 02/10/18 0901 02/10/18 1225 02/11/18 0631  NA 136  --  135  --  130*  --  135  K 2.8*  --  3.5  --  3.3*  --  3.4*  CL 99*  --  100*  --  96*  --  101  CO2 25  --  27  --  23  --  26  GLUCOSE 186*  --  143*  --  158*  --  84  BUN 16  --  16  --  20  --  16  CREATININE 0.84  --  0.83 0.86 0.95  --  0.78  CALCIUM 10.5*  --  9.3  --  8.2*  --  8.1*  MG  --  1.5* 1.4*  --   --  1.7 1.7   GFR: Estimated Creatinine Clearance: 38.7 mL/min (by C-G formula based on SCr of 0.78 mg/dL). Liver Function Tests: Recent Labs  Lab 02/08/18 0514 02/10/18 0901  AST 24 30  ALT 15 15  ALKPHOS 46 43  BILITOT 0.9 0.9  PROT 6.0* 5.6*  ALBUMIN 3.3* 2.9*   No results for input(s): LIPASE, AMYLASE in the last 168 hours. No results for input(s): AMMONIA in the last 168 hours. Coagulation Profile: Recent Labs  Lab 02/07/18 1955  INR 1.03   Cardiac Enzymes: No results for input(s): CKTOTAL, CKMB, CKMBINDEX, TROPONINI in the last 168 hours. BNP (last 3 results) No results for input(s): PROBNP in the last 8760 hours. HbA1C: No results for input(s): HGBA1C in the last 72 hours. CBG: Recent Labs  Lab 02/10/18 1137  GLUCAP 169*   Lipid Profile: No results for input(s): CHOL,  HDL, LDLCALC, TRIG, CHOLHDL, LDLDIRECT in the last 72 hours. Thyroid Function Tests: No results for input(s): TSH, T4TOTAL, FREET4, T3FREE, THYROIDAB in the last 72 hours. Anemia Panel: No results for input(s): VITAMINB12, FOLATE, FERRITIN, TIBC, IRON, RETICCTPCT in the last 72 hours. Urine analysis:    Component Value Date/Time   COLORURINE YELLOW 02/07/2018 2228   APPEARANCEUR CLOUDY (A) 02/07/2018 2228   LABSPEC 1.011 02/07/2018 2228   PHURINE 8.0 02/07/2018 2228   GLUCOSEU NEGATIVE 02/07/2018 2228   HGBUR NEGATIVE 02/07/2018 2228   BILIRUBINUR NEGATIVE 02/07/2018 2228   KETONESUR NEGATIVE 02/07/2018 2228   PROTEINUR NEGATIVE 02/07/2018 2228  NITRITE NEGATIVE 02/07/2018 2228   LEUKOCYTESUR NEGATIVE 02/07/2018 2228   Sepsis Labs: @LABRCNTIP (procalcitonin:4,lacticidven:4)  ) Recent Results (from the past 240 hour(s))  Surgical pcr screen     Status: None   Collection Time: 02/08/18 10:05 PM  Result Value Ref Range Status   MRSA, PCR NEGATIVE NEGATIVE Final   Staphylococcus aureus NEGATIVE NEGATIVE Final    Comment: (NOTE) The Xpert SA Assay (FDA approved for NASAL specimens in patients 82 years of age and older), is one component of a comprehensive surveillance program. It is not intended to diagnose infection nor to guide or monitor treatment. Performed at [facility], [address] [place], [place] [address]       Radiology Studies: Pelvis Portable  Result Date: 02/09/2018 CLINICAL DATA:  Status post hip replacement EXAM: PORTABLE PELVIS 1-2 VIEWS COMPARISON:  None. FINDINGS: Patient is status post left hip replacement. Acetabular and femoral components are in good position. IMPRESSION: Status post left hip replacement Electronically Signed   By: Gerome Samavid  Williams III M.D   On: 02/09/2018 16:50   Dg C-arm 1-60 Min  Result Date: 02/09/2018 CLINICAL DATA:  Status post left hip replacement. FLUOROSCOPY TIME:  7 seconds. Images: 2 EXAM: OPERATIVE LEFT HIP (WITH  PELVIS IF PERFORMED) 2 VIEWS TECHNIQUE: Fluoroscopic spot image(s) were submitted for interpretation post-operatively. COMPARISON:  None. FINDINGS: The patient is status post left hip replacement. The acetabular and femoral components are in good position. IMPRESSION: Left hip replacement as above. Electronically Signed   By: Gerome Samavid  Williams III M.D   On: 02/09/2018 17:33   Dg Hip Operative Unilat With Pelvis Left  Result Date: 02/09/2018 CLINICAL DATA:  Status post left hip replacement. FLUOROSCOPY TIME:  7 seconds. Images: 2 EXAM: OPERATIVE LEFT HIP (WITH PELVIS IF PERFORMED) 2 VIEWS TECHNIQUE: Fluoroscopic spot image(s) were submitted for interpretation post-operatively. COMPARISON:  None. FINDINGS: The patient is status post left hip replacement. The acetabular and femoral components are in good position. IMPRESSION: Left hip replacement as above. Electronically Signed   By: Gerome Samavid  Williams III M.D   On: 02/09/2018 17:33     Scheduled Meds: . docusate sodium  100 mg Oral BID  . enoxaparin (LOVENOX) injection  40 mg Subcutaneous Q24H  . potassium chloride  40 mEq Oral Daily   Continuous Infusions: . lactated ringers 10 mL/hr at 02/09/18 1151  . magnesium sulfate 1 - 4 g bolus IVPB       LOS: 4 days   Time Spent in minutes   45 minutes   Classie Weng D.O. on 02/11/2018 at 1:27 PM  Between 7am to 7pm - Pager - [phone]  After 7pm go to [email or web address] - password [email or web address]  And look for the night coverage person covering for me after hours  [facility] Office  [phone]

## 2018-02-11 NOTE — Plan of Care (Signed)
  Problem: Health Behavior/Discharge Planning: Goal: Ability to manage health-related needs will improve Outcome: Progressing   Problem: Clinical Measurements: Goal: Ability to maintain clinical measurements within normal limits will improve Outcome: Progressing Goal: Will remain free from infection Outcome: Progressing Goal: Diagnostic test results will improve Outcome: Progressing Goal: Respiratory complications will improve Outcome: Progressing Goal: Cardiovascular complication will be avoided Outcome: Progressing   Problem: Activity: Goal: Risk for activity intolerance will decrease Outcome: Progressing   Problem: Nutrition: Goal: Adequate nutrition will be maintained Outcome: Progressing   Problem: Coping: Goal: Level of anxiety will decrease Outcome: Progressing   Problem: Elimination: Goal: Will not experience complications related to bowel motility Outcome: Progressing Goal: Will not experience complications related to urinary retention Outcome: Progressing   Problem: Pain Managment: Goal: General experience of comfort will improve Outcome: Progressing   Problem: Safety: Goal: Ability to remain free from injury will improve Outcome: Progressing   Problem: Skin Integrity: Goal: Risk for impaired skin integrity will decrease Outcome: Progressing   Problem: Education: Goal: Verbalization of understanding the information provided (i.e., activity precautions, restrictions, etc) will improve Outcome: Progressing   Problem: Activity: Goal: Ability to ambulate and perform ADLs will improve Outcome: Progressing   Problem: Clinical Measurements: Goal: Postoperative complications will be avoided or minimized Outcome: Progressing   Problem: Self-Concept: Goal: Ability to maintain and perform role responsibilities to the fullest extent possible will improve Outcome: Progressing

## 2018-02-11 NOTE — Consult Note (Signed)
Physical Medicine and Rehabilitation Consult Reason for Consult: Decreased functional mobility Referring Physician: [facility]   HPI: Stephanie Reyes is a 84 y.o. right-handed female with history of hypertension.  Per chart review patient lives alone and was independent prior to admission.  Patient does not drive.  She has family in the area that check on her as needed.  One level apartment with no steps to entry.  Presented 02/08/2018 after a fall while standing up from her chair.  No loss of consciousness.  X-rays and imaging revealed displaced left femoral neck fracture.  Underwent left hip hemiarthroplasty anterior approach 02/09/2018 per Dr. Linna CapriceSwinteck.  Hospital course pain management.  Weightbearing as tolerated left lower extremity.  Maintained on Lovenox for DVT prophylaxis.  Physical and occupational therapy evaluations completed 02/10/2018 with recommendations of physical medicine rehab consult.   Review of Systems  Constitutional: Negative for chills and fever.  HENT: Negative for hearing loss.   Eyes: Negative for blurred vision and double vision.  Respiratory: Negative for cough and shortness of breath.   Cardiovascular: Positive for palpitations. Negative for chest pain and leg swelling.  Gastrointestinal: Positive for constipation. Negative for heartburn and nausea.  Genitourinary: Negative for dysuria, flank pain and hematuria.  Musculoskeletal: Positive for joint pain and myalgias.  Skin: Negative for rash.  Neurological: Negative for seizures.  All other systems reviewed and are negative.  Past Medical History:  Diagnosis Date  . Essential hypertension   . Premature atrial contractions   . Syncope    a. 02/2017 Echo: EF 55-60%, mild LVH, no rwma, Gr1 DD, triv AI, mildly dil Ao root, mild MR, mod TR, PASP 52mmHg.   Past Surgical History:  Procedure Laterality Date  . HERNIA REPAIR    . TONSILLECTOMY     History reviewed. No pertinent family history. Social History:   reports that she has never smoked. She has never used smokeless tobacco. She reports that she does not drink alcohol or use drugs. Allergies:  Allergies  Allergen Reactions  . Ampicillin Nausea And Vomiting  . Aspirin Nausea And Vomiting  . Eggs Or Egg-Derived Products     Egg whites Reports not being able to use albumin  . Penicillins Nausea And Vomiting and Swelling   Medications Prior to Admission  Medication Sig Dispense Refill  . amLODipine (NORVASC) 5 MG tablet Take 1 tablet (5 mg total) by mouth daily. 90 tablet 3  . chlorthalidone (HYGROTON) 25 MG tablet Take 0.5 tablets (12.5 mg total) by mouth daily. 45 tablet 3  . metoprolol tartrate (LOPRESSOR) 25 MG tablet Take 0.5 tablets (12.5 mg total) by mouth 2 (two) times daily. 90 tablet 3  . Multiple Vitamin (MULTIVITAMIN WITH MINERALS) TABS tablet Take 1 tablet by mouth every other day.    Marland Kitchen. OVER THE COUNTER MEDICATION Take 1 tablet by mouth every other day. For bones      Home: Home Living Family/patient expects to be discharged to:: Private residence Living Arrangements: Alone Available Help at Discharge: Family, Available PRN/intermittently Type of Home: Apartment Home Access: Level entry Home Layout: One level Bathroom Shower/Tub: Engineer, manufacturing systemsTub/shower unit Bathroom Toilet: Handicapped height Home Equipment: Bankerhower seat  Functional History: Prior Function Level of Independence: Independent Comments: Pt states she has assist for driving and grocery shopping. Is very independent and involved with her 6 kids and community Functional Status:  Mobility: Bed Mobility Overal bed mobility: Needs Assistance Bed Mobility: Supine to Sit Supine to sit: HOB elevated, Mod assist General  bed mobility comments: Some assist to advance L hip/LLE into full EOB position. Assist also to power up trunk.  Transfers Overall transfer level: Needs assistance Equipment used: Rolling walker (2 wheeled) Transfers: Sit to/from Stand Sit to Stand: Mod  assist, From elevated surface General transfer comment: Mod A to power up and steady. Cues for hand placement and technique with rw. +2 utilized on initial stand from EOB, +1 the remainder of the session.  Ambulation/Gait Ambulation/Gait assistance: Min assist, +2 safety/equipment Ambulation Distance (Feet): 5 Feet Assistive device: Rolling walker (2 wheeled) Gait Pattern/deviations: Step-to pattern, Shuffle General Gait Details: Cues for stepping and sequence; good suport of self on RW to unweigh painful LLE for R stepping    ADL: ADL Overall ADL's : Needs assistance/impaired Eating/Feeding: Set up, Sitting Grooming: Set up, Sitting Upper Body Bathing: Set up, Sitting Lower Body Bathing: Moderate assistance, Sit to/from stand Upper Body Dressing : Set up, Sitting Lower Body Dressing: Moderate assistance, Sit to/from stand Toilet Transfer: Moderate assistance, Stand-pivot, BSC, RW Toileting- Clothing Manipulation and Hygiene: Moderate assistance, Sit to/from stand Tub/ Shower Transfer: Tub Metallurgist Details (indicate cue type and reason): would need tub bench. unable to step over tub wall at this time Functional mobility during ADLs: Moderate assistance, Rolling walker(pivotol steps) General ADL Comments: Pt completed bed mobility, sat EOB a few minutes then SPT to BSC followed by pivotol steps to recliner.   Cognition: Cognition Overall Cognitive Status: Within Functional Limits for tasks assessed Orientation Level: Oriented X4 Cognition Arousal/Alertness: Awake/alert Behavior During Therapy: WFL for tasks assessed/performed Overall Cognitive Status: Within Functional Limits for tasks assessed General Comments: some higher level deficits observed. from home alone.   Blood pressure 129/78, pulse 70, temperature (!) 97.5 F (36.4 C), temperature source Oral, resp. rate 18, weight 64.3 kg (141 lb 12.1 oz), SpO2 96 %. Physical Exam  Vitals  reviewed. Constitutional: She is oriented to person, place, and time.  HENT:  Head: Normocephalic.  Eyes: EOM are normal.  Neck: Normal range of motion. Neck supple. No thyromegaly present.  Cardiovascular: Normal rate, regular rhythm and normal heart sounds.  Respiratory: Effort normal and breath sounds normal. No respiratory distress.  GI: Soft. Bowel sounds are normal. She exhibits no distension.  Musculoskeletal:  Left leg tender, mild edema  Neurological: She is alert and oriented to person, place, and time.  UE 4/5 bilaterally. LLE limited by pain. No sensory findings.   Skin:  Hip incision clean and dry appropriately tender.  Psychiatric: She has a normal mood and affect. Her behavior is normal.    Results for orders placed or performed during the hospital encounter of 02/07/18 (from the past 24 hour(s))  CBC     Status: Abnormal   Collection Time: 02/10/18  9:01 AM  Result Value Ref Range   WBC 7.0 4.0 - 10.5 K/uL   RBC 3.54 (L) 3.87 - 5.11 MIL/uL   Hemoglobin 11.5 (L) 12.0 - 15.0 g/dL   HCT 78.2 (L) 95.6 - 21.3 %   MCV 96.0 78.0 - 100.0 fL   MCH 32.5 26.0 - 34.0 pg   MCHC 33.8 30.0 - 36.0 g/dL   RDW 08.6 57.8 - 46.9 %   Platelets 129 (L) 150 - 400 K/uL  Comprehensive metabolic panel     Status: Abnormal   Collection Time: 02/10/18  9:01 AM  Result Value Ref Range   Sodium 130 (L) 135 - 145 mmol/L   Potassium 3.3 (L) 3.5 - 5.1 mmol/L  Chloride 96 (L) 101 - 111 mmol/L   CO2 23 22 - 32 mmol/L   Glucose, Bld 158 (H) 65 - 99 mg/dL   BUN 20 6 - 20 mg/dL   Creatinine, Ser 4.25 0.44 - 1.00 mg/dL   Calcium 8.2 (L) 8.9 - 10.3 mg/dL   Total Protein 5.6 (L) 6.5 - 8.1 g/dL   Albumin 2.9 (L) 3.5 - 5.0 g/dL   AST 30 15 - 41 U/L   ALT 15 14 - 54 U/L   Alkaline Phosphatase 43 38 - 126 U/L   Total Bilirubin 0.9 0.3 - 1.2 mg/dL   GFR calc non Af Amer 50 (L) >60 mL/min   GFR calc Af Amer 58 (L) >60 mL/min   Anion gap 11 5 - 15  Glucose, capillary     Status: Abnormal    Collection Time: 02/10/18 11:37 AM  Result Value Ref Range   Glucose-Capillary 169 (H) 65 - 99 mg/dL  Magnesium     Status: None   Collection Time: 02/10/18 12:25 PM  Result Value Ref Range   Magnesium 1.7 1.7 - 2.4 mg/dL   Pelvis Portable  Result Date: 02/09/2018 CLINICAL DATA:  Status post hip replacement EXAM: PORTABLE PELVIS 1-2 VIEWS COMPARISON:  None. FINDINGS: Patient is status post left hip replacement. Acetabular and femoral components are in good position. IMPRESSION: Status post left hip replacement Electronically Signed   By: Gerome Sam III M.D   On: 02/09/2018 16:50   Dg C-arm 1-60 Min  Result Date: 02/09/2018 CLINICAL DATA:  Status post left hip replacement. FLUOROSCOPY TIME:  7 seconds. Images: 2 EXAM: OPERATIVE LEFT HIP (WITH PELVIS IF PERFORMED) 2 VIEWS TECHNIQUE: Fluoroscopic spot image(s) were submitted for interpretation post-operatively. COMPARISON:  None. FINDINGS: The patient is status post left hip replacement. The acetabular and femoral components are in good position. IMPRESSION: Left hip replacement as above. Electronically Signed   By: Gerome Sam III M.D   On: 02/09/2018 17:33   Dg Hip Operative Unilat With Pelvis Left  Result Date: 02/09/2018 CLINICAL DATA:  Status post left hip replacement. FLUOROSCOPY TIME:  7 seconds. Images: 2 EXAM: OPERATIVE LEFT HIP (WITH PELVIS IF PERFORMED) 2 VIEWS TECHNIQUE: Fluoroscopic spot image(s) were submitted for interpretation post-operatively. COMPARISON:  None. FINDINGS: The patient is status post left hip replacement. The acetabular and femoral components are in good position. IMPRESSION: Left hip replacement as above. Electronically Signed   By: Gerome Sam III M.D   On: 02/09/2018 17:33    Assessment/Plan: Diagnosis: left femoral neck fracture s/p hip hemiarthroplasty 1. Does the need for close, 24 hr/day medical supervision in concert with the patient's rehab needs make it unreasonable for this patient to be served  in a less intensive setting? Yes 2. Co-Morbidities requiring supervision/potential complications: hypertension,  hypokalemia, post-op anemia, persistent dizziness with ambulation 3. Due to bladder management, bowel management, safety, skin/wound care, disease management, medication administration, pain management and patient education, does the patient require 24 hr/day rehab nursing? Yes 4. Does the patient require coordinated care of a physician, rehab nurse, PT (1-2 hrs/day, 5 days/week) and OT (1-2 hrs/day, 5 days/week) to address physical and functional deficits in the context of the above medical diagnosis(es)? Yes Addressing deficits in the following areas: balance, endurance, locomotion, strength, transferring, bowel/bladder control, bathing, dressing, feeding, grooming and toileting 5. Can the patient actively participate in an intensive therapy program of at least 3 hrs of therapy per day at least 5 days per week? Yes 6. The  potential for patient to make measurable gains while on inpatient rehab is good 7. Anticipated functional outcomes upon discharge from inpatient rehab are modified independent  with PT, modified independent with OT, n/a with SLP. 8. Estimated rehab length of stay to reach the above functional goals is: 7-10 days 9. Anticipated D/C setting: Home 10. Anticipated post D/C treatments: HH therapy 11. Overall Rehab/Functional Prognosis: excellent  RECOMMENDATIONS: This patient's condition is appropriate for continued rehabilitative care in the following setting: CIR Patient has agreed to participate in recommended program. Yes Note that insurance prior authorization may be required for reimbursement for recommended care.  Comment: Rehab Admissions Coordinator to follow up. Spent extensive time speaking with patient and daughter. She was independent/active PTA.  Thanks,  Ranelle Oyster, MD, [place] [facility]    Mcarthur Rossetti Angiulli, PA-C 02/11/2018

## 2018-02-11 NOTE — Consult Note (Addendum)
Cardiology Consultation:   Patient ID: Midge AverCorinne L Mittman; [id number]; 25-May-1924   Admit date: 02/07/2018 Date of Consult: 02/11/2018  Primary Care Provider: Shaune PollackGates, Donna, MD Primary Cardiologist: Chrystie NoseKenneth C Hilty, MD   Patient Profile:   Midge AverCorinne L Swallow is a 84 y.o. female with a hx of unexplained syncope in 2018 with frequent ectopy noted on hospital monitor at that time, however no significant arrthymias on follow-up outpatient monitor, who is being seen today for the evaluation of recurrent near syncope resulting in fall w/ subsequent left femoral neck fracture, at the request of the patient's family and Dr. Catha GosselinMikhail, Internal Medicine.  History of Present Illness:   Ms. Irving Showsgerton has been followed by Dr. Rennis GoldenHilty. She established care with him in April 2018 after she suffered a syncopal event and was found to have frequent ectopy on her monitor. There was however no evidence for atrial fibrillation. Subsequently, we placed an outpatient monitor which showed no evidence of A. fib. She did undergo an echocardiogram which showed mild LVH, EF 55-60%, diastolic dysfunction and moderate pulmonary hypertension. There was no significant valvular disease.  The aortic root was mildly dilated to 4.1 cm. She also has a h/o HTN. She has a h/o intolerance to lisinopril. Her BP has been controlled with amlodipine and chorthalidone and ? Metoprolol (on epic home med list but not written on pt's personal med list).    [facility] after a fall, resulting in displaced left femoral neck fracture. On 02/09/18, she underwent left hip hemiarthroplasty by Dr. Linna CapriceSwinteck. Per today's progress note from hospitalist, pt now states that she felt that she had a near syncopal episode, prior to her fall. Thus, pt and her family have requested cardiology consultation.   EKG this admit showed NSR with PACs. Pt is currently not on tele. She is resting comfortably in bed. Both her son and daughter are present at bedside.  Pt also had episode of hypotension while working with PT today and required bolus of IVFs. All antihypertensives currently on hold. Pt did get pain meds earlier today. SCr and BUN are WNL. Hgb stable post op at 11.   In retrospect, pt recalls that 2-3 days leading up to her fall, she did not "feel right". She felt tired with little energy. Just prior to her fall, she walked from her apartment (lives in independent living facility) to her mailbox. When she returned to her room, she felt dizzy and lightheaded. She went to find a chair to sit down and as she was lowering herself down, she missed the chair and landed on the floor. The pt denies LOC. No CP or dyspnea. Her neighbor passed by her room and noted that she was on the floor and called for help.   The patient had recurrent near syncope earlier today, but this was related to orthostatic hypotension, while working with PT. Systolic BP reportedly dropped into the 60s and improved after bolus of IVFs.    Past Medical History:  Diagnosis Date  . Essential hypertension   . Premature atrial contractions   . Syncope    a. 02/2017 Echo: EF 55-60%, mild LVH, no rwma, Gr1 DD, triv AI, mildly dil Ao root, mild MR, mod TR, PASP 52mmHg.    Past Surgical History:  Procedure Laterality Date  . HERNIA REPAIR    . TONSILLECTOMY       Home Medications:  Prior to Admission medications   Medication Sig Start Date End Date Taking? Authorizing Provider  amLODipine (NORVASC) 5 MG tablet Take 1 tablet (5 mg total) by mouth daily. 03/13/17 02/07/18 Yes Creig Hines, NP  chlorthalidone (HYGROTON) 25 MG tablet Take 0.5 tablets (12.5 mg total) by mouth daily. 04/26/17 02/07/18 Yes Hilty, Lisette Abu, MD  metoprolol tartrate (LOPRESSOR) 25 MG tablet Take 0.5 tablets (12.5 mg total) by mouth 2 (two) times daily. 04/26/17  Yes Hilty, Lisette Abu, MD  Multiple Vitamin (MULTIVITAMIN WITH MINERALS) TABS tablet Take 1 tablet by mouth every other day.   Yes [provider]  OVER THE COUNTER MEDICATION Take 1 tablet by mouth every other day. For bones   Yes [provider]  apixaban (ELIQUIS) 2.5 MG TABS tablet Take 1 tablet (2.5 mg total) by mouth 2 (two) times daily. 02/11/18   Swinteck, Arlys John, MD  traMADol (ULTRAM) 50 MG tablet Take 1 tablet (50 mg total) by mouth every 6 (six) hours as needed. 02/11/18   Swinteck, Arlys John, MD    Inpatient Medications: Scheduled Meds: . docusate sodium  100 mg Oral BID  . enoxaparin (LOVENOX) injection  40 mg Subcutaneous Q24H  . potassium chloride  40 mEq Oral Daily   Continuous Infusions: . lactated ringers 10 mL/hr at 02/09/18 1151  . magnesium sulfate 1 - 4 g bolus IVPB     PRN Meds: acetaminophen **OR** acetaminophen, HYDROmorphone (DILAUDID) injection, menthol-cetylpyridinium **OR** phenol, metoCLOPramide **OR** metoCLOPramide (REGLAN) injection, ondansetron **OR** ondansetron (ZOFRAN) IV, traMADol  Allergies:    Allergies  Allergen Reactions  . Ampicillin Nausea And Vomiting  . Aspirin Nausea And Vomiting  . Eggs Or Egg-Derived Products     Egg whites Reports not being able to use albumin  . Penicillins Nausea And Vomiting and Swelling    Social History:   Social History   Socioeconomic History  . Marital status: Divorced    Spouse name: Not on file  . Number of children: Not on file  . Years of education: Not on file  . Highest education level: Not on file  Occupational History  . Not on file  Social Needs  . Financial resource strain: Not on file  . Food insecurity:    Worry: Not on file    Inability: Not on file  . Transportation needs:    Medical: Not on file    Non-medical: Not on file  Tobacco Use  . Smoking status: Never Smoker  . Smokeless tobacco: Never Used  Substance and Sexual Activity  . Alcohol use: No  . Drug use: No  . Sexual activity: Not on file  Lifestyle  . Physical activity:    Days per week: Not on file    Minutes per session: Not on file  .  Stress: Not on file  Relationships  . Social connections:    Talks on phone: Not on file    Gets together: Not on file    Attends religious service: Not on file    Active member of club or organization: Not on file    Attends meetings of clubs or organizations: Not on file    Relationship status: Not on file  . Intimate partner violence:    Fear of current or ex partner: Not on file    Emotionally abused: Not on file    Physically abused: Not on file    Forced sexual activity: Not on file  Other Topics Concern  . Not on file  Social History Narrative  . Not on file    Family History:   Family History  Problem Relation Age of Onset  . Hypertension Mother      ROS:  Please see the history of present illness.   All other ROS reviewed and negative.     Physical Exam/Data:   Vitals:   02/10/18 2146 02/11/18 0541 02/11/18 1339 02/11/18 1351  BP: 130/82 129/78 (!) 64/50 108/80  Pulse: 81 70 (!) 35 (!) 47  Resp: 20 18 18    Temp: 98.3 F (36.8 C) (!) 97.5 F (36.4 C)    TempSrc: Oral Oral    SpO2: 98% 96% (!) 87%   Weight:        Intake/Output Summary (Last 24 hours) at 02/11/2018 1405 Last data filed at 02/11/2018 1300 Gross per 24 hour  Intake 360 ml  Output 400 ml  Net -40 ml   Filed Weights   02/10/18 0601  Weight: 141 lb 12.1 oz (64.3 kg)   Body mass index is 24.33 kg/m.  General:  Well nourished, well developed, in no acute distress, elderly,   HEENT: normal Lymph: no adenopathy Neck: no JVD Endocrine:  No thryomegaly Vascular: No carotid bruits; FA pulses 2+ bilaterally without bruits  Cardiac:  Irregular rhythm, regular rate. No murmurs. Lungs:  clear to auscultation bilaterally, no wheezing, rhonchi or rales  Abd: soft, nontender, no hepatomegaly  Ext: no edema Musculoskeletal:  No deformities, BUE and BLE strength normal and equal Skin: warm and dry  Neuro:  CNs 2-12 intact, no focal abnormalities noted Psych:  Normal affect   EKG:  The EKG was  personally reviewed and demonstrates:  NSR w/ PACs Telemetry:  Telemetry was personally reviewed and demonstrates:  Currently not on tele. Will order.   Relevant CV Studies: 2D Echo 02/2017  Study Conclusions  - Left ventricle: The cavity size was normal. There was mild focal   basal hypertrophy of the septum. Systolic function was normal.   The estimated ejection fraction was in the range of 55% to 60%.   Wall motion was normal; there were no regional wall motion   abnormalities. Doppler parameters are consistent with abnormal   left ventricular relaxation (grade 1 diastolic dysfunction).   Doppler parameters are consistent with high ventricular filling   pressure. - Aortic valve: There was trivial regurgitation. - Aortic root: The aortic root was mildly dilated. - Mitral valve: There was mild regurgitation. - Tricuspid valve: There was moderate regurgitation. - Pulmonary arteries: Systolic pressure was moderately increased.   PA peak pressure: 52 mm Hg (S).  Impressions:  - Normal LV systolic function; mild LVH; grade 1 diastolic   dysfunction; mildly dilated aortic root; trace AI; mild MR;   moderate TR; moderately elevated pulmonary pressure.  Laboratory Data:  Chemistry Recent Labs  Lab 02/08/18 0514 02/09/18 1903 02/10/18 0901 02/11/18 0631  NA 135  --  130* 135  K 3.5  --  3.3* 3.4*  CL 100*  --  96* 101  CO2 27  --  23 26  GLUCOSE 143*  --  158* 84  BUN 16  --  20 16  CREATININE 0.83 0.86 0.95 0.78  CALCIUM 9.3  --  8.2* 8.1*  GFRNONAA 59* 57* 50* >60  GFRAA >60 >60 58* >60  ANIONGAP 8  --  11 8    Recent Labs  Lab 02/08/18 0514 02/10/18 0901  PROT 6.0* 5.6*  ALBUMIN 3.3* 2.9*  AST 24 30  ALT 15 15  ALKPHOS 46 43  BILITOT 0.9 0.9   Hematology Recent Labs  Lab 02/09/18 1903  02/10/18 0901 02/11/18 0631  WBC 7.9 7.0 5.7  RBC 3.78* 3.54* 3.29*  HGB 12.4 11.5* 11.0*  HCT 36.8 34.0* 31.6*  MCV 97.4 96.0 96.0  MCH 32.8 32.5 33.4  MCHC 33.7  33.8 34.8  RDW 12.8 12.5 12.8  PLT 127* 129* 130*   Cardiac EnzymesNo results for input(s): TROPONINI in the last 168 hours. No results for input(s): TROPIPOC in the last 168 hours.  BNPNo results for input(s): BNP, PROBNP in the last 168 hours.  DDimer No results for input(s): DDIMER in the last 168 hours.  Radiology/Studies:  Dg Tibia/fibula Left  Result Date: 02/07/2018 CLINICAL DATA:  84 y/o  F; fall with proximal lower leg pain. EXAM: LEFT TIBIA AND FIBULA - 2 VIEW COMPARISON:  None. FINDINGS: Mild narrowing of the femorotibial compartments of the knee. Chondrocalcinosis of knee joint. No acute fracture or dislocation identified. Vascular calcifications and soft tissues. IMPRESSION: 1. No acute fracture or dislocation identified. 2. Osteoarthrosis of the knee with chondrocalcinosis. Electronically Signed   By: Mitzi Hansen M.D.   On: 02/07/2018 18:06   Pelvis Portable  Result Date: 02/09/2018 CLINICAL DATA:  Status post hip replacement EXAM: PORTABLE PELVIS 1-2 VIEWS COMPARISON:  None. FINDINGS: Patient is status post left hip replacement. Acetabular and femoral components are in good position. IMPRESSION: Status post left hip replacement Electronically Signed   By: Gerome Sam III M.D   On: 02/09/2018 16:50   Ct Hip Left Wo Contrast  Result Date: 02/07/2018 CLINICAL DATA:  Left hip pain after a fall today. Fracture demonstrated on plain radiographs. EXAM: CT OF THE LEFT HIP WITHOUT CONTRAST TECHNIQUE: Multidetector CT imaging of the left hip was performed according to the standard protocol. Multiplanar CT image reconstructions were also generated. COMPARISON:  Left hip 02/07/2018 FINDINGS: Bones/Joint/Cartilage Mostly transverse impacted fracture of the subcapital left femoral neck. There is valgus angulation of the left hip. No dislocation. Tiny displaced comminuted fragments are demonstrated. No evidence of inter trochanteric involvement. Visualize acetabulum appears intact.  Old appearing deformity of the inferior pubic ramus. Degenerative changes in the symphysis pubis. Ligaments Suboptimally assessed by CT. Muscles and Tendons No intramuscular hematoma. Mild fatty atrophy of the gluteal muscles. Soft tissues No significant soft tissue hematoma or infiltration. Vascular calcifications noted. IMPRESSION: Transverse impacted fracture of the subcapital left femoral neck with valgus angulation. Electronically Signed   By: Burman Nieves M.D.   On: 02/07/2018 23:16   Dg C-arm 1-60 Min  Result Date: 02/09/2018 CLINICAL DATA:  Status post left hip replacement. FLUOROSCOPY TIME:  7 seconds. Images: 2 EXAM: OPERATIVE LEFT HIP (WITH PELVIS IF PERFORMED) 2 VIEWS TECHNIQUE: Fluoroscopic spot image(s) were submitted for interpretation post-operatively. COMPARISON:  None. FINDINGS: The patient is status post left hip replacement. The acetabular and femoral components are in good position. IMPRESSION: Left hip replacement as above. Electronically Signed   By: Gerome Sam III M.D   On: 02/09/2018 17:33   Dg Hip Operative Unilat With Pelvis Left  Result Date: 02/09/2018 CLINICAL DATA:  Status post left hip replacement. FLUOROSCOPY TIME:  7 seconds. Images: 2 EXAM: OPERATIVE LEFT HIP (WITH PELVIS IF PERFORMED) 2 VIEWS TECHNIQUE: Fluoroscopic spot image(s) were submitted for interpretation post-operatively. COMPARISON:  None. FINDINGS: The patient is status post left hip replacement. The acetabular and femoral components are in good position. IMPRESSION: Left hip replacement as above. Electronically Signed   By: Gerome Sam III M.D   On: 02/09/2018 17:33   Dg Hip Unilat With Pelvis 2-3 Views  Left  Result Date: 02/07/2018 CLINICAL DATA:  Left hip pain after fall. EXAM: DG HIP (WITH OR WITHOUT PELVIS) 2-3V LEFT COMPARISON:  None. FINDINGS: Acute, impacted fracture of the left femoral neck. No dislocation. Mild bilateral hip osteoarthritis. The pubic symphysis and sacroiliac joints are  intact. Osteopenia. Soft tissues are unremarkable. IMPRESSION: 1. Acute, impacted fracture of the left femoral neck. Electronically Signed   By: Obie Dredge M.D.   On: 02/07/2018 18:05    Assessment and Plan:   OLUWASEYI TULL is a 84 y.o. female with a hx of unexplained syncope in 2018 with frequent ectopy noted on hospital monitor at that time, however no significant arrthymias on follow-up outpatient monitor, who is being seen today for the evaluation of recurrent near syncope resulting in fall w/ subsequent left femoral neck fracture, at the request of the patient's family and Dr. Catha Gosselin, Internal Medicine.   1. ? Near Syncope: in retrospect, pt believes that she may have had a  near syncopal episode leading to her fall on 02/07/18. Pt has a prior h/o syncope with negative w/u in April 2018. Her telemetry during last year's hospitalization did show frequent ectopy but no atrial fibrillation. She was further evaluated with outpatient monitor which showed SR with PACs, occasional sinus bradycardia but no concerning arrhthymias. No pauses. Echo in 2018 showed normal LVEF and no valvular dysfunction. It should also be noted that pt was hypotensive this admit while working with PT, which responded to IVF bolus. Recommend checking orthostatics. She may benefit from use of compression stockings. No murmurs or carotid bruits noted on exam. Her rhythm is irregular and primary doctor has ordered a repeat 12 lead to check for PACs vs atrial fibrillation. We will also order telemetry for further observation while pt is hospitalized, to look for arrhthymias that may explain her recent symptoms. We discussed repeat outpatient monitor, but pt a little resistant to the idea given the difficulty she had last year w/ home monitor (hassle/ inconvenience/ uncomfortable). Given her hypotension, recommend holding her antihypertensives.   2. Left Femoral Neck Fracture: 2/2 fall. S/p surgical repair. She is on Lovenox  for DVT prophylaxis.   3. Hypokalemia: 3.4 today. 40 mg PO KDur ordered.    4. H/o PACs: irregular rhythm on exam. 12 lead EKG pending. Will also order tele for further observation. Hypokalemia is being corrected w/ PO K supplementation.   5. Orthostatic Hypotension: anemia s/p femoral fracture and orthopedic surgery. Hgb trend 14.7>>13>>12>>11. Renal function ok, SCr 0.78. BUN 16. BP improved with IVFs. Continue to monitor H/H closely. Continue to hold antihypertensive medications.    For questions or updates, please contact [facility] Please consult [email or web address] for contact info under Cardiology/STEMI.   Signed, Robbie Lis, PA-C  02/11/2018 2:05 PM   Personally seen and examined. Agree with above.  She noted some dizziness prior to hip fx. With me, she states that she did not loose consciousness. No CP, no SOB. Daughter in room  Exam: Thin, elderly, alert, pleasant, RRR, CTAB, soft +BS, no edema.   Labs: personally reviewed  A/P:  Dizziness/orthostatic hypotension - today, BP low when standing. IVF given. PO intake. Salt liberalization. Agree with stopping all BP meds (stop metoprolol, amlodipine and chlorthalidone) at home. Would only resume one at a time if BP is SBP> 160. Likely has labile BP.   - Will place on tele overnight. If OK, I'm comfortable with her going to rehab.   - No need for long term monitor. No  atrial fibrillation previously.   Donato Schultz, MD

## 2018-02-11 NOTE — Progress Notes (Signed)
Physical Therapy Treatment Patient Details Name: Stephanie AverCorinne L Reyes MRN: [id number] DOB: Oct 20, 1924 Today's Date: 02/11/2018    History of Present Illness 84 y.o. female with a displaced Left femoral neck fracture now s/p left hip hemiarthroplasty, anterior approach. PMH includes: Premature atrial contractions, syncope, HTN.    PT Comments    Continuing work on functional mobility and activity tolerance;  More painful today, but motivated to participate; [facility] to [facility], and she was able to void during our session; felt the need for BM, noted flatus, but did not move bowels;   Amb distance limited by dizziness this session (see other PT note of this date for details); Notified Dr. Catha GosselinMikhail, encouraged fluids, and to be out of bed as much as able;   I still would like to consider CIR; Once orthostasis is controlled, I believe she can handle to higher intensity of therapies at CIR; Does her advanced age bolster our case?   Follow Up Recommendations  CIR     Equipment Recommendations  Rolling walker with 5" wheels;3in1 (PT)    Recommendations for Other Services Rehab consult     Precautions / Restrictions Precautions Precautions: Fall Precaution Comments: Orthostatic hypotension; Session conducted on Room Air and O2 sats remained greater than or equal to 93% Restrictions LLE Weight Bearing: Weight bearing as tolerated    Mobility  Bed Mobility Overal bed mobility: Needs Assistance Bed Mobility: Supine to Sit     Supine to sit: HOB elevated;Mod assist     General bed mobility comments: Cues for technqiue; Light mod assist and use of bed pad to bring hips closer to EOB in prep for getting up ; Mod handheld assist to elevate trunk to sit  Transfers Overall transfer level: Needs assistance Equipment used: Rolling walker (2 wheeled) Transfers: Sit to/from Stand Sit to Stand: Mod assist;From elevated surface         General transfer comment: Mod A to  power up and steady. Cues for hand placement and technique with RW; cues also to self-monitor for activity tolerance  Ambulation/Gait Ambulation/Gait assistance: Min assist;+2 safety/equipment Ambulation Distance (Feet): 6 Feet(Pivot steps [facility] to [facility] to recliner) Assistive device: Rolling walker (2 wheeled) Gait Pattern/deviations: Step-to pattern;Antalgic     General Gait Details: Cues for stepping and sequence; good suport of self on RW to unweigh painful LLE for R stepping; Reported dizzy/lightheaded early in our walk; amb distance limited by dizziness   Stairs            Wheelchair Mobility    Modified Rankin (Stroke Patients Only)       Balance     Sitting balance-Leahy Scale: Fair       Standing balance-Leahy Scale: Poor Standing balance comment: external support for balance                            Cognition Arousal/Alertness: Awake/alert Behavior During Therapy: WFL for tasks assessed/performed Overall Cognitive Status: Within Functional Limits for tasks assessed                                        Exercises      General Comments General comments (skin integrity, edema, etc.): Dizzy with upright activity, unable to capture BP in standing, but BP sitting after standing 1 minute significantly low; see other note of this date  for details; Notified Dr. Catha Gosselin      Pertinent Vitals/Pain Pain Assessment: Faces Faces Pain Scale: Hurts little more Pain Location: LLE Pain Descriptors / Indicators: Grimacing;Sore Pain Intervention(s): Monitored during session;Patient requesting pain meds-RN notified    Home Living                      Prior Function            PT Goals (current goals can now be found in the care plan section) Acute Rehab PT Goals Patient Stated Goal: regain independence PT Goal Formulation: With patient Time For Goal Achievement: 02/24/18 Potential to Achieve Goals: Good Progress towards PT  goals: Not progressing toward goals - comment(Limited by orthostatic hypotension)    Frequency    Min 5X/week      PT Plan Current plan remains appropriate    Co-evaluation              AM-PAC PT "6 Clicks" Daily Activity  Outcome Measure  Difficulty turning over in bed (including adjusting bedclothes, sheets and blankets)?: Unable Difficulty moving from lying on back to sitting on the side of the bed? : Unable Difficulty sitting down on and standing up from a chair with arms (e.g., wheelchair, bedside commode, etc,.)?: Unable Help needed moving to and from a bed to chair (including a wheelchair)?: A Lot Help needed walking in hospital room?: A Lot Help needed climbing 3-5 steps with a railing? : A Lot 6 Click Score: 9    End of Session Equipment Utilized During Treatment: Gait belt Activity Tolerance: Other (comment)(Limited by low BP) Patient left: in chair;with call bell/phone within reach;with family/visitor present Nurse Communication: Mobility status PT Visit Diagnosis: Unsteadiness on feet (R26.81);Pain Pain - Right/Left: Left Pain - part of body: Hip     Time: 1610-9604 PT Time Calculation (min) (ACUTE ONLY): 47 min  Charges:  $Therapeutic Activity: 38-52 mins                    G Codes:       Van Clines, PT  Acute Rehabilitation Services Pager [phone] Office [phone]    Levi Aland 02/11/2018, 11:31 AM

## 2018-02-12 DIAGNOSIS — I480 Paroxysmal atrial fibrillation: Secondary | ICD-10-CM

## 2018-02-12 LAB — BASIC METABOLIC PANEL
ANION GAP: 7 (ref 5–15)
BUN: 11 mg/dL (ref 6–20)
CO2: 24 mmol/L (ref 22–32)
CREATININE: 0.77 mg/dL (ref 0.44–1.00)
Calcium: 7.7 mg/dL — ABNORMAL LOW (ref 8.9–10.3)
Chloride: 101 mmol/L (ref 101–111)
Glucose, Bld: 83 mg/dL (ref 65–99)
Potassium: 4 mmol/L (ref 3.5–5.1)
SODIUM: 132 mmol/L — AB (ref 135–145)

## 2018-02-12 LAB — CBC
HEMATOCRIT: 32.6 % — AB (ref 36.0–46.0)
Hemoglobin: 11 g/dL — ABNORMAL LOW (ref 12.0–15.0)
MCH: 32.9 pg (ref 26.0–34.0)
MCHC: 33.7 g/dL (ref 30.0–36.0)
MCV: 97.6 fL (ref 78.0–100.0)
PLATELETS: 152 10*3/uL (ref 150–400)
RBC: 3.34 MIL/uL — ABNORMAL LOW (ref 3.87–5.11)
RDW: 12.9 % (ref 11.5–15.5)
WBC: 5.2 10*3/uL (ref 4.0–10.5)

## 2018-02-12 LAB — MAGNESIUM: MAGNESIUM: 2 mg/dL (ref 1.7–2.4)

## 2018-02-12 MED ORDER — DOCUSATE SODIUM 100 MG PO CAPS: 100.0000 mg | ORAL_CAPSULE | Freq: Two times a day (BID) | ORAL | 0 refills | Status: DC

## 2018-02-12 NOTE — Progress Notes (Signed)
RN tried calling report to [facility] still did not get anyone to answer. Notified SW

## 2018-02-12 NOTE — Progress Notes (Signed)
Occupational Therapy Treatment Patient Details Name: Stephanie Reyes MRN: [id number] DOB: [birth year] Today's Date: 02/12/2018    History of present illness 84 y.o. female with a displaced Left femoral neck fracture now s/p left hip hemiarthroplasty, anterior approach. PMH includes: Premature atrial contractions, syncope, HTN.   OT comments  Pt making progress towards OT goals, presents supine in bed pleasant and willing to participate in therapy session. Pt requiring ModA+2 for bed mobility and sit<>stand at RW. Once standing pt able to complete short distance functional mobility within room using RW and with MinA+2. BP monitored during session with improvements noted from yesterday (see vitals section for orthostatics). Noted insurance denial for CIR; d/c recommendations have been updated to reflect as pt will require SNF level ST rehab services prior to return home. Will continue to follow acutely to progress pt towards established OT goals.   Follow Up Recommendations  SNF;Supervision/Assistance - 24 hour    Equipment Recommendations  Other (comment)(TBD in next venue )          Precautions / Restrictions Precautions Precautions: Fall Precaution Comments: Orthostatic hypotension Restrictions Weight Bearing Restrictions: Yes LLE Weight Bearing: Weight bearing as tolerated       Mobility Bed Mobility Overal bed mobility: Needs Assistance Bed Mobility: Supine to Sit     Supine to sit: HOB elevated;Mod assist     General bed mobility comments: Cues for technqiue; Light mod assist and use of bed pad to bring hips closer to EOB in prep for getting up ; Mod handheld assist to elevate trunk to sit  Transfers Overall transfer level: Needs assistance Equipment used: Rolling walker (2 wheeled) Transfers: Sit to/from Stand Sit to Stand: Mod assist;+2 physical assistance;+2 safety/equipment         General transfer comment: assist to power up and steady at RW; cues to continue  self-monitoring for activity tolerance and s/s of dizziness/lightheadedness     Balance Overall balance assessment: Needs assistance Sitting-balance support: Feet supported;No upper extremity supported;Single extremity supported Sitting balance-Leahy Scale: Fair     Standing balance support: Bilateral upper extremity supported;During functional activity Standing balance-Leahy Scale: Poor Standing balance comment: external support for balance                           ADL either performed or assessed with clinical judgement   ADL Overall ADL's : Needs assistance/impaired                             Toileting- Clothing Manipulation and Hygiene: Moderate assistance;Sit to/from stand       Functional mobility during ADLs: Minimal assistance;+2 for physical assistance;+2 for safety/equipment;Rolling walker General ADL Comments: pt completing bed mobility; room level functional mobility within room and LB exercise, declined ADL completion this session; orthostats taken during mobility                        Cognition Arousal/Alertness: Awake/alert Behavior During Therapy: WFL for tasks assessed/performed Overall Cognitive Status: Within Functional Limits for tasks assessed                                                            Pertinent Vitals/ Pain  Pain Assessment: 0-10 Pain Score: 5  Pain Location: LLE Pain Descriptors / Indicators: Grimacing;Sore Pain Intervention(s): Limited activity within patient's tolerance;Monitored during session;Repositioned                                                          Frequency  Min 3X/week        Progress Toward Goals  OT Goals(current goals can now be found in the care plan section)  Progress towards OT goals: Progressing toward goals  Acute Rehab OT Goals Patient Stated Goal: regain independence OT Goal Formulation: With  patient/family Time For Goal Achievement: 02/24/18 Potential to Achieve Goals: Good  Plan Discharge plan needs to be updated    Co-evaluation    PT/OT/SLP Co-Evaluation/Treatment: Yes Reason for Co-Treatment: For patient/therapist safety;To address functional/ADL transfers   OT goals addressed during session: ADL's and self-care;Strengthening/ROM      AM-PAC PT "6 Clicks" Daily Activity     Outcome Measure   Help from another person eating meals?: None Help from another person taking care of personal grooming?: None Help from another person toileting, which includes using toliet, bedpan, or urinal?: A Lot Help from another person bathing (including washing, rinsing, drying)?: A Lot Help from another person to put on and taking off regular upper body clothing?: A Little Help from another person to put on and taking off regular lower body clothing?: A Lot 6 Click Score: 17    End of Session Equipment Utilized During Treatment: Gait belt;Rolling walker  OT Visit Diagnosis: Unsteadiness on feet (R26.81);Pain;History of falling (Z91.81) Pain - Right/Left: Left Pain - part of body: Hip   Activity Tolerance Patient tolerated treatment well   Patient Left in chair;with call bell/phone within reach;with family/visitor present   Nurse Communication Mobility status        Time: 1111-1202 OT Time Calculation (min): 51 min  Charges: OT General Charges $OT Visit: 1 Visit OT Treatments $Therapeutic Activity: 8-22 mins  Marcy SirenBreanna Annlee Glandon, OT Pager [phone] 02/12/2018   Orlando PennerBreanna L Jarron Curley 02/12/2018, 11:59 AM

## 2018-02-12 NOTE — Progress Notes (Signed)
Physical Therapy Treatment Patient Details Name: Stephanie AverCorinne L Reyes MRN: [id number] DOB: 06/18/1924 Today's Date: 02/12/2018    History of Present Illness 84 y.o. female with a displaced Left femoral neck fracture now s/p left hip hemiarthroplasty, anterior approach. PMH includes: Premature atrial contractions, syncope, HTN.    PT Comments    Continuing work on functional mobility and activity tolerance;  Much improved activity tolerance, and able to initiate gait training in the room today; noted did not get insurance approval  For CIR; updated plan to SNF for post-acute rehab; See vitals flow sheet for orthostatics  Follow Up Recommendations  SNF     Equipment Recommendations  Rolling walker with 5" wheels;3in1 (PT)    Recommendations for Other Services       Precautions / Restrictions Precautions Precautions: Fall Restrictions Weight Bearing Restrictions: Yes LLE Weight Bearing: Weight bearing as tolerated    Mobility  Bed Mobility Overal bed mobility: Needs Assistance Bed Mobility: Supine to Sit     Supine to sit: HOB elevated;Mod assist     General bed mobility comments: Cues for technqiue; Light mod assist and use of bed pad to bring hips closer to EOB in prep for getting up ; Mod handheld assist to elevate trunk to sit  Transfers Overall transfer level: Needs assistance Equipment used: Rolling walker (2 wheeled) Transfers: Sit to/from Stand Sit to Stand: Mod assist;+2 physical assistance;+2 safety/equipment         General transfer comment: assist to power up and steady at RW; cues to continue self-monitoring for activity tolerance and s/s of dizziness/lightheadedness   Ambulation/Gait Ambulation/Gait assistance: Min assist;+2 safety/equipment Ambulation Distance (Feet): 11 Feet Assistive device: Rolling walker (2 wheeled) Gait Pattern/deviations: Step-to pattern;Antalgic     General Gait Details: Cues for stepping and sequence; good suport of self on RW  to unweigh painful LLE for R stepping; Reported dizzy/lightheaded early in our walk; amb distance limited by dizziness   Stairs            Wheelchair Mobility    Modified Rankin (Stroke Patients Only)       Balance     Sitting balance-Leahy Scale: Fair     Standing balance support: Bilateral upper extremity supported;During functional activity Standing balance-Leahy Scale: Poor                              Cognition Arousal/Alertness: Awake/alert Behavior During Therapy: WFL for tasks assessed/performed Overall Cognitive Status: Within Functional Limits for tasks assessed                                        Exercises Total Joint Exercises Ankle Circles/Pumps: AROM;Both;10 reps Quad Sets: AROM;Left;10 reps Gluteal Sets: AROM;Both;10 reps Towel Squeeze: AROM;Both;10 reps Heel Slides: AAROM;Left;10 reps Hip ABduction/ADduction: AAROM;Left;10 reps Long Arc Quad: AROM;Left;5 reps    General Comments        Pertinent Vitals/Pain Pain Assessment: 0-10 Pain Score: 5  Pain Location: LLE Pain Descriptors / Indicators: Grimacing;Sore Pain Intervention(s): Monitored during session    Home Living                      Prior Function            PT Goals (current goals can now be found in the care plan section) Acute Rehab PT Goals Patient Stated Goal:  regain independence PT Goal Formulation: With patient Time For Goal Achievement: 02/24/18 Potential to Achieve Goals: Good Progress towards PT goals: Progressing toward goals    Frequency    Min 5X/week      PT Plan Discharge plan needs to be updated    Co-evaluation PT/OT/SLP Co-Evaluation/Treatment: Yes Reason for Co-Treatment: For patient/therapist safety PT goals addressed during session: Mobility/safety with mobility OT goals addressed during session: ADL's and self-care      AM-PAC PT "6 Clicks" Daily Activity  Outcome Measure  Difficulty turning  over in bed (including adjusting bedclothes, sheets and blankets)?: Unable Difficulty moving from lying on back to sitting on the side of the bed? : Unable Difficulty sitting down on and standing up from a chair with arms (e.g., wheelchair, bedside commode, etc,.)?: Unable Help needed moving to and from a bed to chair (including a wheelchair)?: A Lot Help needed walking in hospital room?: A Lot Help needed climbing 3-5 steps with a railing? : A Lot 6 Click Score: 9    End of Session Equipment Utilized During Treatment: Gait belt Activity Tolerance: Patient tolerated treatment well Patient left: in chair;with call bell/phone within reach Nurse Communication: Mobility status PT Visit Diagnosis: Unsteadiness on feet (R26.81);Pain Pain - Right/Left: Left Pain - part of body: Hip     Time: 1114-1203 PT Time Calculation (min) (ACUTE ONLY): 49 min  Charges:  $Gait Training: 8-22 mins                    G Codes:       Van Clines, PT  Acute Rehabilitation Services Pager [phone] Office [phone]    Levi Aland 02/12/2018, 4:49 PM

## 2018-02-12 NOTE — Progress Notes (Signed)
RN tried calling report to [facility] twice and no one answered the phone it just rings and cuts off. Will try calling again

## 2018-02-12 NOTE — Progress Notes (Signed)
    Subjective:  Patient reports pain as moderate.  Denies N/V/CP/SOB. Feels better today.  Objective:   VITALS:   Vitals:   02/11/18 1351 02/11/18 1956 02/12/18 0625 02/12/18 1016  BP: 108/80 (!) 147/98 127/88 120/82  Pulse: (!) 47 74 81 76  Resp:  18 17 16   Temp:  97.8 F (36.6 C) 98 F (36.7 C) 98.7 F (37.1 C)  TempSrc:  Oral Oral Oral  SpO2:  97% 98% 93%  Weight:        NAD ABD soft Sensation intact distally Intact pulses distally Dorsiflexion/Plantar flexion intact Incision: dressing C/D/I Compartment soft   Lab Results  Component Value Date   WBC 5.2 02/12/2018   HGB 11.0 (L) 02/12/2018   HCT 32.6 (L) 02/12/2018   MCV 97.6 02/12/2018   PLT 152 02/12/2018   BMET    Component Value Date/Time   NA 132 (L) 02/12/2018 0535   NA 136 05/07/2017 1406   K 4.0 02/12/2018 0535   CL 101 02/12/2018 0535   CO2 24 02/12/2018 0535   GLUCOSE 83 02/12/2018 0535   BUN 11 02/12/2018 0535   BUN 19 05/07/2017 1406   CREATININE 0.77 02/12/2018 0535   CALCIUM 7.7 (L) 02/12/2018 0535   GFRNONAA >60 02/12/2018 0535   GFRAA >60 02/12/2018 0535     Assessment/Plan: 3 Days Post-Op   Principal Problem:   Left displaced femoral neck fracture (HCC) Active Problems:   Hypokalemia   AF (paroxysmal atrial fibrillation) (HCC)   Femoral neck fracture (HCC)   Displaced fracture of left femoral neck (HCC)   WBAT with walker DVT ppx: lovenox in house --> home on apixaban, SCDs, TEDS PO pain control: avoid narcotics PT/OT Orthostasis: improved Dispo: rec d/c home with HHPT when medically ready   Iline OvenBrian J Starlyn Droge 02/12/2018, 1:14 PM   Samson FredericBrian Elzora Cullins, MD Cell [phone]

## 2018-02-12 NOTE — Discharge Summary (Signed)
Physician Discharge Summary  Stephanie Reyes EAV:[id number] DOB: 03-04-1924 DOA: 02/07/2018  PCP: Shaune PollackGates, Donna, MD  Admit date: 02/07/2018 Discharge date: 02/12/2018  Time spent: 45 minutes  Recommendations for Outpatient Follow-up:  Patient will be discharged to skilled nursing facility. Continue physical, occupational therapy.  Patient will need to follow up with primary care provider within one week of discharge. Follow up with Dr. Rennis GoldenHilty, cardiology. Follow up with Dr. Veda CanningSwintek, orthopedic surgeon, in one week Patient should continue medications as prescribed.  Patient should follow a soft diet.   Discharge Diagnoses:  Displaced left femoral neck fracture Premature atrial contractions/history of syncope Essential hypertension/Hypotension Hypokalemia  Hypomagnesemia  Discharge Condition: stable  Diet recommendation: soft  Code status: DNR  Filed Weights   02/10/18 0601  Weight: 64.3 kg (141 lb 12.1 oz)    History of present illness:  on 02/07/2018 by Dr. Pearson GrippeJames Kim CorinneEgertonis a84 y.o.female,[name], Pafib apparently has mechanical fall while at home.[name]  Hospital Course:  Displaced left femoral neck fracture -Status post fall -Orthopedics consulted and appreciated, status post left hip hemiarthroplasty on 02/09/2018 by Dr. Linna CapriceSwinteck -Continue pain control; will add tramadol (patient does not like oxycodone) -At baseline, patient uses a walker or cane -PT recommended CIR -CIR consulted and feels patient may be a candidate-however insurance denied CIR -Patient will be discharged to SNF -follow up with Dr. Linna CapriceSwinteck  Premature atrial contractions/history of syncope-orthostatic -Patient follows with Dr. Rennis GoldenHilty, cardiology -Upon review of patient's chart patient last saw Dr. Rennis GoldenHilty on 11/16/2017.  Noted in her chart, she has intolerance to lisinopril.  Patient was placed on amlodipine for blood pressure control.  In that  particular progress note, metoprolol 12.5 mg twice daily is listed as an outpatient medication prior to visit. -Patient now states she feel she had a syncopal or near syncopal event prior to her fall -Cardiology consulted and appreciated, telemetry reviewed and reassuring. No adverse rhythms. Recommended holding antihypertensive medications and add back if SBP is consistently over 160. Would start with low dose metoprolol   Essential hypertension/Hypotension -discontinue metoprolol, amlodipine, chlorthalidone -see discussion above  -BP currently stable   Hypokalemia  -Resolved, potassium 4  Hypomagnesemia -Magnesium 2  Consultants Orthopedic surgery, Dr. Linna CapriceSwinteck  Procedures  Left hip hemiarthroplasty, anterior approach  Discharge Exam: Vitals:   02/12/18 0625 02/12/18 1016  BP: 127/88 120/82  Pulse: 81 76  Resp: 17 16  Temp: 98 F (36.7 C) 98.7 F (37.1 C)  SpO2: 98% 93%   Patient state she is full at cardiology came to see her yesterday.  Currently denies any pain.  Denies any chest pain, shortness breath, abdominal pain, nausea or vomiting, diarrhea or constipation.   General: Well developed, elderly, thin, no apparent distress  HEENT: NCAT,  mucous membranes moist.  Cardiovascular: S1 S2 auscultated, no rubs, murmurs or gallops. Regular rate and rhythm.  Respiratory: Clear to auscultation bilaterally with equal chest rise  Abdomen: Soft, nontender, nondistended, + bowel sounds  Extremities: warm dry without cyanosis clubbing or edema  Neuro: AAOx3, nonfocal  Psych: Normal affect and demeanor with intact judgement and insight  Discharge Instructions Discharge Instructions    Discharge instructions   Complete by:  As directed    Patient will be discharged to skilled nursing facility. Continue physical, occupational therapy.  Patient will need to follow up with primary care provider within one week of discharge. Follow up with Dr. Rennis GoldenHilty, cardiology. Follow  up with Dr. Veda CanningSwintek, orthopedic  surgeon, in one week Patient should continue medications as prescribed.  Patient should follow a soft diet.     Allergies as of 02/12/2018      Reactions   Ampicillin Nausea And Vomiting   Aspirin Nausea And Vomiting   Eggs Or Egg-derived Products    Egg whites Reports not being able to use albumin   Penicillins Nausea And Vomiting, Swelling      Medication List    STOP taking these medications   amLODipine 5 MG tablet Commonly known as:  NORVASC   chlorthalidone 25 MG tablet Commonly known as:  HYGROTON   metoprolol tartrate 25 MG tablet Commonly known as:  LOPRESSOR   OVER THE COUNTER MEDICATION     TAKE these medications   apixaban 2.5 MG Tabs tablet Commonly known as:  ELIQUIS Take 1 tablet (2.5 mg total) by mouth 2 (two) times daily.   docusate sodium 100 MG capsule Commonly known as:  COLACE Take 1 capsule (100 mg total) by mouth 2 (two) times daily.   multivitamin with minerals Tabs tablet Take 1 tablet by mouth every other day.   traMADol 50 MG tablet Commonly known as:  ULTRAM Take 1 tablet (50 mg total) by mouth every 6 (six) hours as needed.      Allergies  Allergen Reactions  . Ampicillin Nausea And Vomiting  . Aspirin Nausea And Vomiting  . Eggs Or Egg-Derived Products     Egg whites Reports not being able to use albumin  . Penicillins Nausea And Vomiting and Swelling   Follow-up Information    Swinteck, Arlys John, MD. Schedule an appointment as soon as possible for a visit in 2 weeks.   Specialty:  Orthopedic Surgery Why:  For wound re-check Contact information: [address] [place] [address] [phone]        Shaune Pollack, MD. Schedule an appointment as soon as possible for a visit in 1 week(s).   Specialty:  Family Medicine Why:  Hospital follow up Contact information: [address] [place] [address] [phone]        Chrystie Nose, MD .     Specialty:  Cardiology Contact information: [address] [place] [address] [phone]            The results of significant diagnostics from this hospitalization (including imaging, microbiology, ancillary and laboratory) are listed below for reference.    Significant Diagnostic Studies: Dg Tibia/fibula Left  Result Date: 02/07/2018 CLINICAL DATA:  84 y/o  F; fall with proximal lower leg pain. EXAM: LEFT TIBIA AND FIBULA - 2 VIEW COMPARISON:  None. FINDINGS: Mild narrowing of the femorotibial compartments of the knee. Chondrocalcinosis of knee joint. No acute fracture or dislocation identified. Vascular calcifications and soft tissues. IMPRESSION: 1. No acute fracture or dislocation identified. 2. Osteoarthrosis of the knee with chondrocalcinosis. Electronically Signed   By: Mitzi Hansen M.D.   On: 02/07/2018 18:06   Pelvis Portable  Result Date: 02/09/2018 CLINICAL DATA:  Status post hip replacement EXAM: PORTABLE PELVIS 1-2 VIEWS COMPARISON:  None. FINDINGS: Patient is status post left hip replacement. Acetabular and femoral components are in good position. IMPRESSION: Status post left hip replacement Electronically Signed   By: Gerome Sam III M.D   On: 02/09/2018 16:50   Ct Hip Left Wo Contrast  Result Date: 02/07/2018 CLINICAL DATA:  Left hip pain after a fall today. Fracture demonstrated on plain radiographs. EXAM: CT OF THE LEFT HIP WITHOUT  CONTRAST TECHNIQUE: Multidetector CT imaging of the left hip was performed according to the standard protocol. Multiplanar CT image reconstructions were also generated. COMPARISON:  Left hip 02/07/2018 FINDINGS: Bones/Joint/Cartilage Mostly transverse impacted fracture of the subcapital left femoral neck. There is valgus angulation of the left hip. No dislocation. Tiny displaced comminuted fragments are demonstrated. No evidence of inter trochanteric involvement. Visualize acetabulum appears intact. Old  appearing deformity of the inferior pubic ramus. Degenerative changes in the symphysis pubis. Ligaments Suboptimally assessed by CT. Muscles and Tendons No intramuscular hematoma. Mild fatty atrophy of the gluteal muscles. Soft tissues No significant soft tissue hematoma or infiltration. Vascular calcifications noted. IMPRESSION: Transverse impacted fracture of the subcapital left femoral neck with valgus angulation. Electronically Signed   By: Burman Nieves M.D.   On: 02/07/2018 23:16   Dg C-arm 1-60 Min  Result Date: 02/09/2018 CLINICAL DATA:  Status post left hip replacement. FLUOROSCOPY TIME:  7 seconds. Images: 2 EXAM: OPERATIVE LEFT HIP (WITH PELVIS IF PERFORMED) 2 VIEWS TECHNIQUE: Fluoroscopic spot image(s) were submitted for interpretation post-operatively. COMPARISON:  None. FINDINGS: The patient is status post left hip replacement. The acetabular and femoral components are in good position. IMPRESSION: Left hip replacement as above. Electronically Signed   By: Gerome Sam III M.D   On: 02/09/2018 17:33   Dg Hip Operative Unilat With Pelvis Left  Result Date: 02/09/2018 CLINICAL DATA:  Status post left hip replacement. FLUOROSCOPY TIME:  7 seconds. Images: 2 EXAM: OPERATIVE LEFT HIP (WITH PELVIS IF PERFORMED) 2 VIEWS TECHNIQUE: Fluoroscopic spot image(s) were submitted for interpretation post-operatively. COMPARISON:  None. FINDINGS: The patient is status post left hip replacement. The acetabular and femoral components are in good position. IMPRESSION: Left hip replacement as above. Electronically Signed   By: Gerome Sam III M.D   On: 02/09/2018 17:33   Dg Hip Unilat With Pelvis 2-3 Views Left  Result Date: 02/07/2018 CLINICAL DATA:  Left hip pain after fall. EXAM: DG HIP (WITH OR WITHOUT PELVIS) 2-3V LEFT COMPARISON:  None. FINDINGS: Acute, impacted fracture of the left femoral neck. No dislocation. Mild bilateral hip osteoarthritis. The pubic symphysis and sacroiliac joints are  intact. Osteopenia. Soft tissues are unremarkable. IMPRESSION: 1. Acute, impacted fracture of the left femoral neck. Electronically Signed   By: Obie Dredge M.D.   On: 02/07/2018 18:05    Microbiology: Recent Results (from the past 240 hour(s))  Surgical pcr screen     Status: None   Collection Time: 02/08/18 10:05 PM  Result Value Ref Range Status   MRSA, PCR NEGATIVE NEGATIVE Final   Staphylococcus aureus NEGATIVE NEGATIVE Final    Comment: (NOTE) The Xpert SA Assay (FDA approved for NASAL specimens in patients 25 years of age and older), is one component of a comprehensive surveillance program. It is not intended to diagnose infection nor to guide or monitor treatment. Performed at [facility], [address] [place], [place] [address]      Labs: Basic Metabolic Panel: Recent Labs  Lab 02/07/18 1955 02/07/18 2036 02/08/18 0514 02/09/18 1903 02/10/18 0901 02/10/18 1225 02/11/18 0631 02/12/18 0535  NA 136  --  135  --  130*  --  135 132*  K 2.8*  --  3.5  --  3.3*  --  3.4* 4.0  CL 99*  --  100*  --  96*  --  101 101  CO2 25  --  27  --  23  --  26 24  GLUCOSE 186*  --  143*  --  158*  --  84 83  BUN 16  --  16  --  20  --  16 11  CREATININE 0.84  --  0.83 0.86 0.95  --  0.78 0.77  CALCIUM 10.5*  --  9.3  --  8.2*  --  8.1* 7.7*  MG  --  1.5* 1.4*  --   --  1.7 1.7 2.0   Liver Function Tests: Recent Labs  Lab 02/08/18 0514 02/10/18 0901  AST 24 30  ALT 15 15  ALKPHOS 46 43  BILITOT 0.9 0.9  PROT 6.0* 5.6*  ALBUMIN 3.3* 2.9*   No results for input(s): LIPASE, AMYLASE in the last 168 hours. No results for input(s): AMMONIA in the last 168 hours. CBC: Recent Labs  Lab 02/07/18 1955 02/08/18 0514 02/09/18 1903 02/10/18 0901 02/11/18 0631 02/12/18 0535  WBC 12.5* 8.6 7.9 7.0 5.7 5.2  NEUTROABS 11.0*  --   --   --   --   --   HGB 14.7 13.3 12.4 11.5* 11.0* 11.0*  HCT 41.9 38.4 36.8 34.0* 31.6* 32.6*  MCV 96.5 97.5 97.4 96.0 96.0 97.6  PLT  206 173 127* 129* 130* 152   Cardiac Enzymes: No results for input(s): CKTOTAL, CKMB, CKMBINDEX, TROPONINI in the last 168 hours. BNP: BNP (last 3 results) No results for input(s): BNP in the last 8760 hours.  ProBNP (last 3 results) No results for input(s): PROBNP in the last 8760 hours.  CBG: Recent Labs  Lab 02/10/18 1137  GLUCAP 169*       Signed:  Chiquitta Matty  [facility] 02/12/2018, 1:08 PM

## 2018-02-12 NOTE — Progress Notes (Signed)
RN tried calling again notified SW of facility not answering. IV has been removed pt comfortable and awaiting PTAR

## 2018-02-12 NOTE — Clinical Social Work Placement (Signed)
   CLINICAL SOCIAL WORK PLACEMENT  NOTE  Date:  02/12/2018  Patient Details  Name: Stephanie Reyes MRN: [id number] Date of Birth: 12-Sep-1924  Clinical Social Work is seeking post-discharge placement for this patient at the Skilled  Nursing Facility level of care (*CSW will initial, date and re-position this form in  chart as items are completed):  Yes   Patient/family provided with [place] Clinical Social Work Department's list of facilities offering this level of care within the geographic area requested by the patient (or if unable, by the patient's family).  Yes   Patient/family informed of their freedom to choose among providers that offer the needed level of care, that participate in Medicare, Medicaid or managed care program needed by the patient, have an available bed and are willing to accept the patient.  Yes   Patient/family informed of 's ownership interest in [facility] and [facility], as well as of the fact that they are under no obligation to receive care at these facilities.  PASRR submitted to EDS on 02/11/18     PASRR number received on 02/11/18     Existing PASRR number confirmed on       FL2 transmitted to all facilities in geographic area requested by pt/family on 02/11/18     FL2 transmitted to all facilities within larger geographic area on       Patient informed that his/her managed care company has contracts with or will negotiate with certain facilities, including the following:        Yes   Patient/family informed of bed offers received.  [facility]     Physician recommends and patient chooses bed at      [facility].  Patient to be transferred to facility by ptar     Patient family notified on 02/12/18 of transfer.  Name of family member notified:  son and dtr     PHYSICIAN Please sign DNR, Please sign FL2     Additional  Comment:    _______________________________________________ Burna SisUris, Payzlee Ryder H, LCSW 02/12/2018, 1:31 PM

## 2018-02-12 NOTE — Progress Notes (Signed)
Progress Note  Patient Name: Stephanie Reyes Date of Encounter: 02/12/2018  Primary Cardiologist: Chrystie Nose, MD   Subjective   Feels great. No CP, no SOB. No palps.   Inpatient Medications    Scheduled Meds: . docusate sodium  100 mg Oral BID  . enoxaparin (LOVENOX) injection  40 mg Subcutaneous Q24H  . potassium chloride  40 mEq Oral Daily   Continuous Infusions: . lactated ringers 10 mL/hr at 02/11/18 2145   PRN Meds: acetaminophen **OR** acetaminophen, HYDROmorphone (DILAUDID) injection, menthol-cetylpyridinium **OR** phenol, metoCLOPramide **OR** metoCLOPramide (REGLAN) injection, ondansetron **OR** ondansetron (ZOFRAN) IV, traMADol   Vital Signs    Vitals:   02/11/18 1339 02/11/18 1351 02/11/18 1956 02/12/18 0625  BP: (!) 64/50 108/80 (!) 147/98 127/88  Pulse: (!) 35 (!) 47 74 81  Resp: 18  18 17   Temp:   97.8 F (36.6 C) 98 F (36.7 C)  TempSrc:   Oral Oral  SpO2: (!) 87%  97% 98%  Weight:        Intake/Output Summary (Last 24 hours) at 02/12/2018 0950 Last data filed at 02/12/2018 0653 Gross per 24 hour  Intake 480 ml  Output 1050 ml  Net -570 ml   Filed Weights   02/10/18 0601  Weight: 141 lb 12.1 oz (64.3 kg)    Telemetry    NSR with PAC's, occasional sinus tachycardia. NO AFIB, no pauses.  - Personally Reviewed  ECG    NSR with PAC's - Personally Reviewed  Physical Exam   GEN: No acute distress.  Elderly Neck: No JVD Cardiac: RRR, no murmurs, rubs, or gallops. Rare ectopy Respiratory: Clear to auscultation bilaterally. GI: Soft, nontender, non-distended  MS: No edema; post op Neuro:  Nonfocal  Psych: Normal affect   Labs    Chemistry Recent Labs  Lab 02/08/18 0514  02/10/18 0901 02/11/18 0631 02/12/18 0535  NA 135  --  130* 135 132*  K 3.5  --  3.3* 3.4* 4.0  CL 100*  --  96* 101 101  CO2 27  --  23 26 24   GLUCOSE 143*  --  158* 84 83  BUN 16  --  20 16 11   CREATININE 0.83   < > 0.95 0.78 0.77  CALCIUM 9.3  --  8.2*  8.1* 7.7*  PROT 6.0*  --  5.6*  --   --   ALBUMIN 3.3*  --  2.9*  --   --   AST 24  --  30  --   --   ALT 15  --  15  --   --   ALKPHOS 46  --  43  --   --   BILITOT 0.9  --  0.9  --   --   GFRNONAA 59*   < > 50* >60 >60  GFRAA >60   < > 58* >60 >60  ANIONGAP 8  --  11 8 7    < > = values in this interval not displayed.     Hematology Recent Labs  Lab 02/10/18 0901 02/11/18 0631 02/12/18 0535  WBC 7.0 5.7 5.2  RBC 3.54* 3.29* 3.34*  HGB 11.5* 11.0* 11.0*  HCT 34.0* 31.6* 32.6*  MCV 96.0 96.0 97.6  MCH 32.5 33.4 32.9  MCHC 33.8 34.8 33.7  RDW 12.5 12.8 12.9  PLT 129* 130* 152    Cardiac EnzymesNo results for input(s): TROPONINI in the last 168 hours. No results for input(s): TROPIPOC in the last 168 hours.  BNPNo results for input(s): BNP, PROBNP in the last 168 hours.   DDimer No results for input(s): DDIMER in the last 168 hours.   Radiology    No results found.  Cardiac Studies   - Normal LV systolic function; mild LVH; grade 1 diastolic   dysfunction; mildly dilated aortic root; trace AI; mild MR;   moderate TR; moderately elevated pulmonary pressure.  Patient Profile     84 y.o. female with orthostatic hypotension, HTN, hip fx. PAC's  Orthostatic hypotension -  Stopped metoprolol 12.5 BID, amlodipine 5mg  QD, Chlorthalidone 12.5 QD -  Do not resume on DC -  If BP consistently >160 SBP at home or rehab, would add back low dose metoprolol first.  -  Tele reassuring. Will DC. No adverse rhythms.   Left femoral neck fracture -  Dr. Linna CapriceSwinteck  PAC's -  Benign.   Will sign off. Please call if ?    For questions or updates, please contact [facility] Please consult [email or web address] for contact info under Cardiology/STEMI.      Signed, Donato SchultzMark Skains, MD  02/12/2018, 9:50 AM

## 2018-02-12 NOTE — Progress Notes (Signed)
Patient will discharge to [facility] Anticipated discharge date: 4/9 Family notified: pt son and dtr at bedside [facility] scheduled at 2:30pm Report #: [phone]  CSW signing off.  Burna SisJenna H. Shaiden Aldous, LCSW Clinical Social Worker [phone]

## 2018-02-12 NOTE — Progress Notes (Signed)
Rehab admissions - [facility].  Options will be SNF placement or [facility]  I will inform patient, case manager and social worker of denial.  Call me for questions.  #[phone]

## 2018-02-12 NOTE — Progress Notes (Signed)
RN tried calling [facility] again still did not have any luck phone just kept hanging up will try again.

## 2018-02-12 NOTE — Progress Notes (Signed)
CSW met with pt and pt son and dtr at bedside.  Provided list of bed offers for SNF in case CIR unable to admit.  Pt and family express strong preference for CIR but will review list and have choices for SNF in case CIR is not approved.  CSW will continue to follow  Jorge Ny, LCSW Clinical Social Worker [phone]

## 2018-02-18 ENCOUNTER — Telehealth: Payer: Self-pay | Admitting: Internal Medicine

## 2018-02-18 NOTE — Telephone Encounter (Signed)
Returned call to patient's daughter Vernona RiegerLaura. She was calling to update her mother's records. Patient is currently in [facility] after recent hospitalization/hip surgery. She is having issues with her BP dropping "alarmingly low" in hospital and she reports Dr. Anne FuSkains saw her in the hospital and stopped her BP meds. Her BP would drop to 70s-80s systolic during PT. The "house doctor" at [facility] Dr. Eula ListenHussain started her on midodrine to treat orthostatic hypotension. Medication added to records.

## 2018-02-18 NOTE — Telephone Encounter (Signed)
New message    Patient's daughter calling to give updates on her mother. Her mother is now in nursing facility. Currently started on Midrodine. Requesting call from nurse

## 2018-03-16 ENCOUNTER — Other Ambulatory Visit: Payer: Self-pay | Admitting: Nurse Practitioner

## 2018-03-16 DIAGNOSIS — I10 Essential (primary) hypertension: Secondary | ICD-10-CM

## 2018-03-16 DIAGNOSIS — R55 Syncope and collapse: Secondary | ICD-10-CM

## 2018-03-18 NOTE — Telephone Encounter (Signed)
REFILL 

## 2018-03-26 ENCOUNTER — Telehealth: Payer: Self-pay | Admitting: Internal Medicine

## 2018-03-26 NOTE — Telephone Encounter (Signed)
New Message   Pt c/o medication issue:  1. Name of Medication: MIDODRINE HCL PO  2. How are you currently taking this medication (dosage and times per day)? Take by mouth as needed  3. Are you having a reaction (difficulty breathing--STAT)? no  4. What is your medication issue? Pt's daughter is calling because the pt was seen in the er on 4/4 and they took her off all meds except the Midodrine, want to know if they need to see  Hilty sooner than recall date of July. Please call

## 2018-03-26 NOTE — Telephone Encounter (Signed)
Returned call to  Gretel Acre (daughter)(DPR) she states that while pt was d/c from hosp they stopped all pt medications except midodrine and she is concerned and wondering if pt should restart any of these medications.   Scheduled f/u appt with Hilty 5-31 to d/u and discuss medications

## 2018-04-05 ENCOUNTER — Encounter: Payer: Self-pay | Admitting: Internal Medicine

## 2018-04-05 ENCOUNTER — Ambulatory Visit (INDEPENDENT_AMBULATORY_CARE_PROVIDER_SITE_OTHER): Payer: Medicare Other | Admitting: Internal Medicine

## 2018-04-05 VITALS — BP 118/87 | HR 97 | Ht 64.0 in | Wt 118.2 lb

## 2018-04-05 DIAGNOSIS — I951 Orthostatic hypotension: Secondary | ICD-10-CM | POA: Diagnosis not present

## 2018-04-05 DIAGNOSIS — R55 Syncope and collapse: Secondary | ICD-10-CM | POA: Diagnosis not present

## 2018-04-05 MED ORDER — MIDODRINE HCL 5 MG PO TABS: 5.0000 mg | ORAL_TABLET | ORAL | 5 refills | Status: DC | PRN

## 2018-04-05 NOTE — Patient Instructions (Signed)
Your physician has recommended you make the following change in your medication:  -- TAKE midodrine as needed for systolic blood pressure under 90 or diastolic blood pressure under 50   Your physician wants you to follow-up in: 6 months with Dr. Rennis GoldenHilty. You will receive a reminder letter in the mail two months in advance. If you don't receive a letter, please call our office to schedule the follow-up appointment.

## 2018-04-05 NOTE — Progress Notes (Signed)
OFFICE FOLLOW-UP NOTE  Chief Complaint:  Follow-up low blood pressure  Primary Care Physician: Darcus Austin, MD  HPI:  Stephanie Reyes is a 84 y.o. female with a past medial history significant for essential hypertension, PACs and a syncopal event. I first met her in the hospital after she suffered a syncopal event was found to have frequent ectopy on her monitor. There was however no evidence for atrial fibrillation. Subsequently we placed an outpatient monitor which showed no evidence of A. fib. She did undergo an echocardiogram which showed mild LVH, EF 71-69%, diastolic dysfunction and moderate pulmonary hypertension. The aortic root was mildly dilated to 4.1 cm. She followed up with [name], NP, who noted she was still hypertensive and increase her amlodipine from 2.5-5 mg daily. She reports intolerance to lisinopril in the past and is also on metoprolol. Blood pressure remains elevated today 151/91 and she reports some lower extremity swelling and shortness of breath with exertion. She denies any further syncopal episodes.  11/16/2017   [name] returns today for follow-up. She reports feeling well. Her edema has improved on chlorthalidone, but her right foot still swells some. She has better BP control in general. She is still very active, despite almost turning 89. She denies chest pain or dyspnea.   04/05/2018  [name] returns today for follow-up.  Fortunately she recently had a fall.  She did not syncopized although felt dizzy.  She fractured her left hip and underwent hemi-arthroplasty.  Since then she has been recovering well although had problems with orthostatic hypotension.  In the hospital Dr. Marlou Porch stopped her metoprolol, amlodipine and chlorthalidone.  Blood pressure remained low when she was placed on midodrine.  She was instructed to take this twice daily although blood pressures were quite elevated on the midodrine at 678-938 systolic over 10-175 diastolic.  She  was then instructed to come off the medication and use it as needed.  Since then her blood pressures have been essentially normal with only occasional blood pressures in the 90s for which she was given the medication.  She remains asymptomatic and is not had no syncopal episodes.   PMHx:  Past Medical History:  Diagnosis Date  . Essential hypertension   . Premature atrial contractions   . Syncope    a. 02/2017 Echo: EF 55-60%, mild LVH, no rwma, Gr1 DD, triv AI, mildly dil Ao root, mild MR, mod TR, PASP 29mHg.    Past Surgical History:  Procedure Laterality Date  . ANTERIOR APPROACH HEMI HIP ARTHROPLASTY Left 02/09/2018   Procedure: ANTERIOR APPROACH HEMI HIP ARTHROPLASTY;  Surgeon: SRod Can MD;  Location: [facility]  Service: Orthopedics;  Laterality: Left;  . HERNIA REPAIR    . TONSILLECTOMY      FAMHx:  Family History  Problem Relation Age of Onset  . Hypertension Mother     SOCHx:   reports that she has never smoked. She has never used smokeless tobacco. She reports that she does not drink alcohol or use drugs.  ALLERGIES:  Allergies  Allergen Reactions  . Fluvirin  [Flu Virus Vaccine] Anaphylaxis  . Aspirin Nausea And Vomiting and Swelling  . Clindamycin/Lincomycin Nausea And Vomiting  . Hydrocodone Nausea And Vomiting    headache  . Penicillins Nausea And Vomiting and Swelling  . Ampicillin Nausea And Vomiting  . Eggs Or Egg-Derived Products     Egg whites Reports not being able to use albumin    ROS: Pertinent items noted in HPI and remainder  of comprehensive ROS otherwise negative.  HOME MEDS: Current Outpatient Medications on File Prior to Visit  Medication Sig Dispense Refill  . Multiple Vitamin (MULTIVITAMIN WITH MINERALS) TABS tablet Take 1 tablet by mouth every other day.    . traMADol (ULTRAM) 50 MG tablet Take 1 tablet (50 mg total) by mouth every 6 (six) hours as needed. 30 tablet 0   No current facility-administered medications on file prior to  visit.     LABS/IMAGING: No results found for this or any previous visit (from the past 48 hour(s)). No results found.  LIPID PANEL: No results found for: CHOL, TRIG, HDL, CHOLHDL, VLDL, LDLCALC, LDLDIRECT   WEIGHTS: Wt Readings from Last 3 Encounters:  04/05/18 118 lb 3.2 oz (53.6 kg)  02/10/18 141 lb 12.1 oz (64.3 kg)  11/16/17 124 lb 12.8 oz (56.6 kg)    VITALS: BP 118/87   Pulse 97   Ht 5' 4" (1.626 m)   Wt 118 lb 3.2 oz (53.6 kg)   BMI 20.29 kg/m   EXAM: General appearance: alert and no distress Neck: no carotid bruit, no JVD and thyroid not enlarged, symmetric, no tenderness/mass/nodules Lungs: clear to auscultation bilaterally Heart: regular rate and rhythm, S1, S2 normal, no murmur, click, rub or gallop Abdomen: soft, non-tender; bowel sounds normal; no masses,  no organomegaly Extremities: extremities normal, atraumatic, no cyanosis or edema Pulses: 2+ and symmetric Skin: Skin color, texture, turgor normal. No rashes or lesions Neurologic: Grossly normal Psych: Pleasant  EKG: Deferred  ASSESSMENT: 1. History of syncope 2. Orthostatic hypotension 3. Frequent PACs 4. Dilated ascending aorta to 4.1 cm 5. Hypertension  PLAN: 1.   Mrs. Fennewald had a fall recently with left hip fracture.  This was not due to syncope as she remembers the event in its entirety.  She has been orthostatic and now is off of all blood pressure medications.  Her blood pressure though was too high on the midodrine with twice daily dosing.  I recommend using it as needed.  She should use the medication if she is symptomatic with blood pressure less than 90 systolic or 50 diastolic.  Follow-up in 6 months.  Pixie Casino, MD, [facility] [place] Director of the Advanced Lipid Disorders &  Cardiovascular Risk Reduction Clinic Diplomate of the American Board of Clinical Lipidology Attending Cardiologist  Direct Dial: [phone]  Fax: [phone]    Website:  [email or web address] 04/05/2018, 12:42 PM

## 2018-04-26 ENCOUNTER — Telehealth: Payer: Self-pay | Admitting: Internal Medicine

## 2018-04-26 NOTE — Telephone Encounter (Signed)
New Message   Wynona CanesChristine anurse calling states that the pt's blood pressure is high pt says due to stress (143/119 and 129/93 and she is taking medication for hypotension and wants to know if she should still be taking it  Pt c/o medication issue:  1. Name of Medication: midodrine (PROAMATINE) 5 MG tablet  2. How are you currently taking this medication (dosage and times per day)? Take 1 tablet (5 mg total) by mouth as needed (systolic BP under 90 or diastolic BP under 50).  3. Are you having a reaction (difficulty breathing--STAT)?   4. What is your medication issue? Christine a nurse calling states that the pt's blood pressure is high pt says due to stress (143/119 and 129/93 and she is taking medication for hypotension and wants to know if she should still be taking it

## 2018-04-26 NOTE — Telephone Encounter (Signed)
Returned call to Hess CorporationChristi RN. She reports that patient's BP is out of their parameters - 143/119, 143/115, 129/93. Patient checks BP once daily. Patient reports stress as many family members have been in and out of her house, her telehealth machines talks to her when her VS are out of range and Aline BrochureChristi has been in patient's home as well. Christi reports that patient's clinician checks have all been fine. Patient also expressed confusion to [facility] about midodrine, explained this is used PRN. Advised that patient should continue to monitor BP daily and if remains consistently elevated for a few days to a week, she needs to call us back.   Will notify MD

## 2019-08-05 ENCOUNTER — Telehealth: Payer: Self-pay | Admitting: Internal Medicine

## 2019-08-05 NOTE — Telephone Encounter (Signed)
Please advise, if okay. Thank you! 

## 2019-08-05 NOTE — Telephone Encounter (Signed)
°  Patient wanted to know if her Granddaughter, Rosana Berger would be able to come in to the patient's appointment with her. The patient does not hear well, and would like to have some extra ears to understand what Dr. Debara Pickett will have to say.   The contact information in the patient's chart for Vevelyn Royals, Rebecca's Mother and the patient's daughter. The patient did not have her Granddaughter's phone number on hand to give me herself. If you are unable to reach the daughter, please let the patient know

## 2019-08-05 NOTE — Telephone Encounter (Signed)
Patient aware OK for granddaughter to come to appt. She cannot hear well (esp with masks)

## 2019-08-07 ENCOUNTER — Other Ambulatory Visit: Payer: Self-pay

## 2019-08-07 ENCOUNTER — Encounter: Payer: Self-pay | Admitting: Internal Medicine

## 2019-08-07 ENCOUNTER — Ambulatory Visit (INDEPENDENT_AMBULATORY_CARE_PROVIDER_SITE_OTHER): Payer: Medicare Other | Admitting: Internal Medicine

## 2019-08-07 VITALS — BP 160/88 | HR 121 | Temp 91.9°F | Ht 64.0 in | Wt 114.0 lb

## 2019-08-07 DIAGNOSIS — I951 Orthostatic hypotension: Secondary | ICD-10-CM

## 2019-08-07 DIAGNOSIS — R55 Syncope and collapse: Secondary | ICD-10-CM | POA: Diagnosis not present

## 2019-08-07 DIAGNOSIS — I712 Thoracic aortic aneurysm, without rupture: Secondary | ICD-10-CM

## 2019-08-07 DIAGNOSIS — R0989 Other specified symptoms and signs involving the circulatory and respiratory systems: Secondary | ICD-10-CM | POA: Diagnosis not present

## 2019-08-07 DIAGNOSIS — I7121 Aneurysm of the ascending aorta, without rupture: Secondary | ICD-10-CM

## 2019-08-07 NOTE — Patient Instructions (Signed)
Medication Instructions:  Your physician recommends that you continue on your current medications as directed. Please refer to the Current Medication list given to you today.  If you need a refill on your cardiac medications before your next appointment, please call your  Follow-Up: At [facility], you and your health needs are our priority.  As part of our continuing mission to provide you with exceptional heart care, we have created designated Provider Care Teams.  These Care Teams include your primary Cardiologist (physician) and Advanced Practice Providers (APPs -  Physician Assistants and Nurse Practitioners) who all work together to provide you with the care you need, when you need it. You will need a follow up appointment in 6 months - virtual visit.  Please call our office 2 months in advance to schedule this appointment.  You may see Pixie Casino, MD or one of the following Advanced Practice Providers on your designated Care Team: [place], [place] . Fabian Sharp, PA-C  Any Other Special Instructions Will Be Listed Below (If Applicable).

## 2019-08-07 NOTE — Progress Notes (Signed)
OFFICE FOLLOW-UP NOTE  Chief Complaint:  Follow-up syncope  Primary Care Physician: Darcus Austin, MD (Inactive)  HPI:  Stephanie Reyes is a 85 y.o. female with a past medial history significant for essential hypertension, PACs and a syncopal event. I first met her in the hospital after she suffered a syncopal event was found to have frequent ectopy on her monitor. There was however no evidence for atrial fibrillation. Subsequently we placed an outpatient monitor which showed no evidence of A. fib. She did undergo an echocardiogram which showed mild LVH, EF 54-62%, diastolic dysfunction and moderate pulmonary hypertension. The aortic root was mildly dilated to 4.1 cm. She followed up with [name], NP, who noted she was still hypertensive and increase her amlodipine from 2.5-5 mg daily. She reports intolerance to lisinopril in the past and is also on metoprolol. Blood pressure remains elevated today 151/91 and she reports some lower extremity swelling and shortness of breath with exertion. She denies any further syncopal episodes.  11/16/2017   Stephanie Reyes returns today for follow-up. She reports feeling well. Her edema has improved on chlorthalidone, but her right foot still swells some. She has better BP control in general. She is still very active, despite almost turning 89. She denies chest pain or dyspnea.   04/05/2018  Stephanie Reyes returns today for follow-up.  Fortunately she recently had a fall.  She did not syncopized although felt dizzy.  She fractured her left hip and underwent hemi-arthroplasty.  Since then she has been recovering well although had problems with orthostatic hypotension.  In the hospital Dr. Marlou Porch stopped her metoprolol, amlodipine and chlorthalidone.  Blood pressure remained low when she was placed on midodrine.  She was instructed to take this twice daily although blood pressures were quite elevated on the midodrine at 703-500 systolic over 93-818 diastolic.  She  was then instructed to come off the medication and use it as needed.  Since then her blood pressures have been essentially normal with only occasional blood pressures in the 90s for which she was given the medication.  She remains asymptomatic and is not had no syncopal episodes.  08/07/2019  Stephanie Reyes is seen today in follow-up.  I am pleased to report she has had no further syncopal episodes.  She says she is only used about 5 midodrine pills over the past year.  She brought in blood pressures which indicate her blood pressures typically in the 299B systolic over 71I and 96V.  She did have one episode with a blood pressure was at 85/49.  She took midodrine and noted marked rapid improvement in her blood pressure.  Overall she is very pleased at how she is feeling and happy to be off medication.  PMHx:  Past Medical History:  Diagnosis Date  . Essential hypertension   . Premature atrial contractions   . Syncope    a. 02/2017 Echo: EF 55-60%, mild LVH, no rwma, Gr1 DD, triv AI, mildly dil Ao root, mild MR, mod TR, PASP 18mHg.    Past Surgical History:  Procedure Laterality Date  . ANTERIOR APPROACH HEMI HIP ARTHROPLASTY Left 02/09/2018   Procedure: ANTERIOR APPROACH HEMI HIP ARTHROPLASTY;  Surgeon: SRod Can MD;  [facility] [facility]  Service: Orthopedics;  Laterality: Left;  . HERNIA REPAIR    . TONSILLECTOMY      FAMHx:  Family History  Problem Relation Age of Onset  . Hypertension Mother     SOCHx:   reports that she has never smoked. She  has never used smokeless tobacco. She reports that she does not drink alcohol or use drugs.  ALLERGIES:  Allergies  Allergen Reactions  . Fluvirin  [Flu Virus Vaccine] Anaphylaxis  . Aspirin Nausea And Vomiting and Swelling  . Clindamycin/Lincomycin Nausea And Vomiting  . Hydrocodone Nausea And Vomiting    headache  . Penicillins Nausea And Vomiting and Swelling  . Ampicillin Nausea And Vomiting  . Eggs Or Egg-Derived Products      Egg whites Reports not being able to use albumin    ROS: Pertinent items noted in HPI and remainder of comprehensive ROS otherwise negative.  HOME MEDS: Current Outpatient Medications on File Prior to Visit  Medication Sig Dispense Refill  . midodrine (PROAMATINE) 5 MG tablet Take 1 tablet (5 mg total) by mouth as needed (systolic BP under 90 or diastolic BP under 50). 30 tablet 5  . Multiple Vitamin (MULTIVITAMIN WITH MINERALS) TABS tablet Take 1 tablet by mouth every other day.     No current facility-administered medications on file prior to visit.     LABS/IMAGING: No results found for this or any previous visit (from the past 48 hour(s)). No results found.  LIPID PANEL: No results found for: CHOL, TRIG, HDL, CHOLHDL, VLDL, LDLCALC, LDLDIRECT   WEIGHTS: Wt Readings from Last 3 Encounters:  08/07/19 114 lb (51.7 kg)  04/05/18 118 lb 3.2 oz (53.6 kg)  02/10/18 141 lb 12.1 oz (64.3 kg)    VITALS: BP (!) 160/88   Pulse (!) 121   Temp (!) 91.9 F (33.3 C) (Temporal)   Ht 5' 4"  (1.626 m)   Wt 114 lb (51.7 kg)   SpO2 97%   BMI 19.57 kg/m   EXAM: General appearance: alert and no distress Neck: no carotid bruit, no JVD and thyroid not enlarged, symmetric, no tenderness/mass/nodules Lungs: clear to auscultation bilaterally Heart: regular rate and rhythm, S1, S2 normal, no murmur, click, rub or gallop Abdomen: soft, non-tender; bowel sounds normal; no masses,  no organomegaly Extremities: extremities normal, atraumatic, no cyanosis or edema Pulses: 2+ and symmetric Skin: Skin color, texture, turgor normal. No rashes or lesions Neurologic: Grossly normal Psych: Pleasant  EKG: Sinus tachycardia with PACs at 120, minimal voltage criteria for LVH-personally reviewed  ASSESSMENT: 1. History of syncope 2. Orthostatic hypotension 3. Frequent PACs 4. Dilated ascending aorta to 4.1 cm 5. Hypertension  PLAN: 1.   Stephanie Reyes has had no further falls or syncopal  episodes.  Her blood pressure is now allowed to be higher although is not significantly elevated.  She has had to use the midodrine very infrequently but has had good success with that.  Otherwise she remains asymptomatic.  No changes to her medicines today.  Follow-up in 6 months.  Pixie Casino, MD, [facility], [place] Director of the Advanced Lipid Disorders &  Cardiovascular Risk Reduction Clinic Diplomate of the American Board of Clinical Lipidology Attending Cardiologist  Direct Dial: [phone]  Fax: [phone]  Website:  [email or web address] 08/07/2019, 2:35 PM

## 2019-11-18 ENCOUNTER — Telehealth: Payer: Self-pay | Admitting: Internal Medicine

## 2019-11-18 NOTE — Telephone Encounter (Signed)
New Message  Just got off the call with the pt to set up an appt. Pt would like to know if her appt on 02/04/20 could be a virtual telephone visit instead of in office.

## 2019-11-19 MED ORDER — MIDODRINE HCL 5 MG PO TABS: 5.0000 mg | ORAL_TABLET | ORAL | 5 refills | Status: DC | PRN

## 2019-11-19 NOTE — Telephone Encounter (Signed)
Per MD note, patient was supposed to be scheduled for VV 6 month follow up. Appointment type changed. Patient aware - advised to check BP, pulse, weight before appointment. Her midodrine was refilled as it may be expired (last refill 03/2018) but she reports only taking the med 4x since last refill

## 2020-01-20 ENCOUNTER — Telehealth: Payer: Self-pay | Admitting: Internal Medicine

## 2020-01-20 NOTE — Telephone Encounter (Signed)
Pt notified to get whatever vaccine is available that there is no preference on one or the other It is just what site has on hand ./cy

## 2020-01-20 NOTE — Telephone Encounter (Signed)
  Pt wanted to speak with Dr. Rennis Golden about what brand of covid vaccine is best for her to get. She was told by her pcp to get the J&J but she doesn't know why. She said Dr. Rennis Golden known her for years and would like to ask him  Please call

## 2020-02-03 ENCOUNTER — Telehealth: Payer: Medicare Other | Admitting: Internal Medicine

## 2020-02-04 ENCOUNTER — Telehealth: Payer: Medicare Other | Admitting: Internal Medicine

## 2020-03-01 ENCOUNTER — Telehealth (INDEPENDENT_AMBULATORY_CARE_PROVIDER_SITE_OTHER): Payer: Medicare Other | Admitting: Internal Medicine

## 2020-03-01 ENCOUNTER — Encounter: Payer: Self-pay | Admitting: Internal Medicine

## 2020-03-01 VITALS — BP 153/119 | HR 87 | Ht 64.0 in | Wt 122.0 lb

## 2020-03-01 DIAGNOSIS — R0989 Other specified symptoms and signs involving the circulatory and respiratory systems: Secondary | ICD-10-CM | POA: Diagnosis not present

## 2020-03-01 DIAGNOSIS — I712 Thoracic aortic aneurysm, without rupture: Secondary | ICD-10-CM | POA: Diagnosis not present

## 2020-03-01 DIAGNOSIS — I951 Orthostatic hypotension: Secondary | ICD-10-CM

## 2020-03-01 DIAGNOSIS — R0600 Dyspnea, unspecified: Secondary | ICD-10-CM | POA: Diagnosis not present

## 2020-03-01 DIAGNOSIS — R0609 Other forms of dyspnea: Secondary | ICD-10-CM

## 2020-03-01 DIAGNOSIS — I7121 Aneurysm of the ascending aorta, without rupture: Secondary | ICD-10-CM

## 2020-03-01 NOTE — Progress Notes (Signed)
Virtual Visit via Telephone Note   This visit type was conducted due to national recommendations for restrictions regarding the COVID-19 Pandemic (e.g. social distancing) in an effort to limit this patient's exposure and mitigate transmission in our community.  Due to her co-morbid illnesses, this patient is at least at moderate risk for complications without adequate follow up.  This format is felt to be most appropriate for this patient at this time.  The patient did not have access to video technology/had technical difficulties with video requiring transitioning to audio format only (telephone).  All issues noted in this document were discussed and addressed.  No physical exam could be performed with this format.  Please refer to the patient's chart for her  consent to telehealth for [facility].   Evaluation Performed: Telephone follow-up  Date:  03/01/2020   ID:  Stephanie Reyes, DOB 1924/08/11, MRN [id number]  Patient Location:  [place] [address]  Provider location:   [address] [place], [place] [address]  PCP:  Darcus Austin, MD (Inactive)  Cardiologist:  Pixie Casino, MD Electrophysiologist:  None   Chief Complaint: Worsening shortness of breath and blood pressure fluctuation  History of Present Illness:    Stephanie Reyes is a 86 y.o. female who presents via audio/video conferencing for a telehealth visit today.  Stephanie Reyes is a 86 y.o. female with a past medial history significant for essential hypertension, PACs and a syncopal event. I first met her in the hospital after she suffered a syncopal event was found to have frequent ectopy on her monitor. There was however no evidence for atrial fibrillation. Subsequently we placed an outpatient monitor which showed no evidence of A. fib. She did undergo an echocardiogram which showed mild LVH, EF 60-63%, diastolic dysfunction and moderate pulmonary hypertension. The aortic root  was mildly dilated to 4.1 cm. She followed up with [name], NP, who noted she was still hypertensive and increase her amlodipine from 2.5-5 mg daily. She reports intolerance to lisinopril in the past and is also on metoprolol. Blood pressure remains elevated today 151/91 and she reports some lower extremity swelling and shortness of breath with exertion. She denies any further syncopal episodes.  11/16/2017   Stephanie Reyes returns today for follow-up. She reports feeling well. Her edema has improved on chlorthalidone, but her right foot still swells some. She has better BP control in general. She is still very active, despite almost turning 89. She denies chest pain or dyspnea.   04/05/2018  Stephanie Reyes returns today for follow-up.  Fortunately she recently had a fall.  She did not syncopized although felt dizzy.  She fractured her left hip and underwent hemi-arthroplasty.  Since then she has been recovering well although had problems with orthostatic hypotension.  In the hospital Dr. Marlou Porch stopped her metoprolol, amlodipine and chlorthalidone.  Blood pressure remained low when she was placed on midodrine.  She was instructed to take this twice daily although blood pressures were quite elevated on the midodrine at 016-010 systolic over 93-235 diastolic.  She was then instructed to come off the medication and use it as needed.  Since then her blood pressures have been essentially normal with only occasional blood pressures in the 90s for which she was given the medication.  She remains asymptomatic and is not had no syncopal episodes.  08/07/2019  Stephanie Reyes is seen today in follow-up.  I am pleased to report she has had no further syncopal  episodes.  She says she is only used about 5 midodrine pills over the past year.  She brought in blood pressures which indicate her blood pressures typically in the 673A systolic over 19F and 79K.  She did have one episode with a blood pressure was at 85/49.  She took  midodrine and noted marked rapid improvement in her blood pressure.  Overall she is very pleased at how she is feeling and happy to be off medication.  03/01/2020  Stephanie Reyes was seen today in follow-up. She complains of some dyspnea, but mostly with exertion and not all the time.  She says that she has not required midodrine at all this year and took it maybe 4 times last year.  In general home blood pressure readings seem to be somewhat labile.  There are some normal blood pressure readings and at times diastolics over 240.  More recently it seems that her blood pressures are little more elevated, but she is on no blood pressure medication.  Her last echo was in 2018 which showed normal systolic function, grade 1 diastolic dysfunction, dilated aortic root to 4.1 cm and moderate pulmonary hypertension.  This will require follow-up, especially with some shortness of breath complaints.  Besides this, however she says she feels well and is active and does not have any complaints especially given her age of 20.  The patient does not have symptoms concerning for COVID-19 infection (fever, chills, cough, or new SHORTNESS OF BREATH).    Prior CV studies:   The following studies were reviewed today:  Heart review, lab work  PMHx:  Past Medical History:  Diagnosis Date  . Essential hypertension   . Premature atrial contractions   . Syncope    a. 02/2017 Echo: EF 55-60%, mild LVH, no rwma, Gr1 DD, triv AI, mildly dil Ao root, mild MR, mod TR, PASP 28mHg.    Past Surgical History:  Procedure Laterality Date  . ANTERIOR APPROACH HEMI HIP ARTHROPLASTY Left 02/09/2018   Procedure: ANTERIOR APPROACH HEMI HIP ARTHROPLASTY;  Surgeon: SRod Can MD;  Location: [facility]  Service: Orthopedics;  Laterality: Left;  . HERNIA REPAIR    . TONSILLECTOMY      FAMHx:  Family History  Problem Relation Age of Onset  . Hypertension Mother     SOCHx:   reports that she has never smoked. She has never used  smokeless tobacco. She reports that she does not drink alcohol or use drugs.  ALLERGIES:  Allergies  Allergen Reactions  . Fluvirin  [Flu Virus Vaccine] Anaphylaxis  . Aspirin Nausea And Vomiting and Swelling  . Clindamycin/Lincomycin Nausea And Vomiting  . Hydrocodone Nausea And Vomiting    headache  . Penicillins Nausea And Vomiting and Swelling  . Ampicillin Nausea And Vomiting  . Eggs Or Egg-Derived Products     Egg whites Reports not being able to use albumin    MEDS:  Current Meds  Medication Sig  . midodrine (PROAMATINE) 5 MG tablet Take 1 tablet (5 mg total) by mouth as needed (systolic BP under 90 or diastolic BP under 50).  . Multiple Vitamin (MULTIVITAMIN WITH MINERALS) TABS tablet Take 1 tablet by mouth every other day.     ROS: Pertinent items noted in HPI and remainder of comprehensive ROS otherwise negative.  Labs/Other Tests and Data Reviewed:    Recent Labs: No results found for requested labs within last 8760 hours.   Recent Lipid Panel No results found for: CHOL, TRIG, HDL, CHOLHDL, LDLCALC, LDLDIRECT  Wt Readings from Last 3 Encounters:  03/01/20 122 lb (55.3 kg)  08/07/19 114 lb (51.7 kg)  04/05/18 118 lb 3.2 oz (53.6 kg)     Exam:    Vital Signs:  BP (!) 153/119   Pulse 87   Ht 5' 4"  (1.626 m)   Wt 122 lb (55.3 kg)   BMI 20.94 kg/m    Exam not performed due to telephone visit  ASSESSMENT & PLAN:    1. Dyspnea on exertion 2. History of syncope 3. Orthostatic hypotension 4. Frequent PACs 5. Dilated ascending aorta to 4.1 cm 6. Hypertension  Mrs. Capshaw has complaints of shortness of breath, which may be due to diastolic dysfunction or hypertension.  She has been labile the blood pressure was noted to be elevated today.  Over the past several days she has had some normal readings as well and is not on medication.  She has not required midodrine this year and only took it for times last year.  She has had no further syncopal events.  I  would like to repeat an echocardiogram to see if there is been any changes in her aortic root as well as her cardiac function and pulmonary pressure.  She will gather more information with blood pressure readings over the next couple weeks and will plan virtual follow-up in about a month.  At that time I might recommend starting low-dose amlodipine for additional blood pressure control.  COVID-19 Education: The signs and symptoms of COVID-19 were discussed with the patient and how to seek care for testing (follow up with PCP or arrange E-visit).  The importance of social distancing was discussed today.  Patient Risk:   After full review of this patients clinical status, I feel that they are at least moderate risk at this time.  Time:   Today, I have spent 25 minutes with the patient with telehealth technology discussing shortness of breath, labile blood pressure, dilated ascending aorta.     Medication Adjustments/Labs and Tests Ordered: Current medicines are reviewed at length with the patient today.  Concerns regarding medicines are outlined above.   Tests Ordered: No orders of the defined types were placed in this encounter.   Medication Changes: No orders of the defined types were placed in this encounter.   Disposition:  in 1 month(s)  Pixie Casino, MD, [facility], [place] Director of the Advanced Lipid Disorders &  Cardiovascular Risk Reduction Clinic Diplomate of the American Board of Clinical Lipidology Attending Cardiologist  Direct Dial: [phone]  Fax: [phone]  Website:  [email or web address]  Pixie Casino, MD  03/01/2020 8:44 AM

## 2020-03-01 NOTE — Patient Instructions (Signed)
Medication Instructions:  Your physician recommends that you continue on your current medications as directed. Please refer to the Current Medication list given to you today.  *If you need a refill on your cardiac medications before your next appointment, please call your pharmacy*   Lab Work: NONE If you have labs (blood work) drawn today and your tests are completely normal, you will receive your results only by: Marland Kitchen MyChart Message (if you have MyChart) OR . A paper copy in the mail If you have any lab test that is abnormal or we need to change your treatment, we will call you to review the results.   Testing/Procedures: Echocardiogram @ [address] - [address]rd Floor    Follow-Up: At [facility], you and your health needs are our priority.  As part of our continuing mission to provide you with exceptional heart care, we have created designated Provider Care Teams.  These Care Teams include your primary Cardiologist (physician) and Advanced Practice Providers (APPs -  Physician Assistants and Nurse Practitioners) who all work together to provide you with the care you need, when you need it.  We recommend signing up for the patient portal called "MyChart".  Sign up information is provided on this After Visit Summary.  MyChart is used to connect with patients for Virtual Visits (Telemedicine).  Patients are able to view lab/test results, encounter notes, upcoming appointments, etc.  Non-urgent messages can be sent to your provider as well.   To learn more about what you can do with MyChart, go to [email or web address].    Your next appointment:   1 month(s)  The format for your next appointment:   Virtual Visit   Provider:   You may see Chrystie Nose, MD or one of the following Advanced Practice Providers on your designated Care Team:    Azalee Course, PA-C  Micah Flesher, PA-C or   Judy Pimple, [place]    Other Instructions  Please check BP at home and report during  next follow up visit

## 2020-03-18 ENCOUNTER — Telehealth: Payer: Self-pay | Admitting: Internal Medicine

## 2020-03-18 NOTE — Telephone Encounter (Signed)
Patient mailed BP readings to office. MD reviewed - generally 120s-130s with occasional highs, but overall OK. No changes per MD  Patient called and made aware MD has reviewed/no changes needed. Voiced understanding.

## 2020-03-19 ENCOUNTER — Ambulatory Visit (HOSPITAL_COMMUNITY): Payer: Medicare Other | Attending: Cardiovascular Disease

## 2020-03-19 ENCOUNTER — Other Ambulatory Visit: Payer: Self-pay

## 2020-03-19 DIAGNOSIS — I951 Orthostatic hypotension: Secondary | ICD-10-CM | POA: Insufficient documentation

## 2020-03-19 DIAGNOSIS — R0989 Other specified symptoms and signs involving the circulatory and respiratory systems: Secondary | ICD-10-CM | POA: Diagnosis present

## 2020-03-19 DIAGNOSIS — R0600 Dyspnea, unspecified: Secondary | ICD-10-CM | POA: Insufficient documentation

## 2020-03-19 DIAGNOSIS — I7121 Aneurysm of the ascending aorta, without rupture: Secondary | ICD-10-CM

## 2020-03-19 DIAGNOSIS — R0609 Other forms of dyspnea: Secondary | ICD-10-CM

## 2020-03-19 DIAGNOSIS — I712 Thoracic aortic aneurysm, without rupture: Secondary | ICD-10-CM | POA: Diagnosis present

## 2020-03-22 ENCOUNTER — Other Ambulatory Visit: Payer: Self-pay | Admitting: *Deleted

## 2020-03-22 MED ORDER — METOPROLOL SUCCINATE ER 25 MG PO TB24: 12.5000 mg | ORAL_TABLET | Freq: Every evening | ORAL | 3 refills | Status: DC

## 2020-03-22 MED ORDER — FUROSEMIDE 20 MG PO TABS: 20.0000 mg | ORAL_TABLET | Freq: Every day | ORAL | 3 refills | Status: DC

## 2020-05-11 NOTE — Progress Notes (Signed)
Cardiology Clinic Note   Patient Name: SHAUNTEL PREST Date of Encounter: 05/12/2020  Primary Care Provider:  Shon Hale, MD Primary Cardiologist:  Chrystie Nose, MD  Patient Profile    [name] 86 year old female presents to the clinic today for follow-up evaluation of her dyspnea.  Past Medical History    Past Medical History:  Diagnosis Date  . Essential hypertension   . Premature atrial contractions   . Syncope    a. 02/2017 Echo: EF 55-60%, mild LVH, no rwma, Gr1 DD, triv AI, mildly dil Ao root, mild MR, mod TR, PASP .   Past Surgical History:  Procedure Laterality Date  . ANTERIOR APPROACH HEMI HIP ARTHROPLASTY Left 02/09/2018   Procedure: ANTERIOR APPROACH HEMI HIP ARTHROPLASTY;  Surgeon: Samson Frederic, MD;  Location: [facility] OR;  Service: Orthopedics;  Laterality: Left;  . HERNIA REPAIR    . TONSILLECTOMY      Allergies  Allergies  Allergen Reactions  . Fluvirin  [Flu Virus Vaccine] Anaphylaxis  . Aspirin Nausea And Vomiting and Swelling  . Clindamycin/Lincomycin Nausea And Vomiting  . Hydrocodone Nausea And Vomiting    headache  . Penicillins Nausea And Vomiting and Swelling  . Ampicillin Nausea And Vomiting  . Eggs Or Egg-Derived Products     Egg whites Reports not being able to use albumin    History of Present Illness    Ms. Lueth has a PMH of syncope, benign essential hypertension, paroxysmal atrial fibrillation, ascending aortic aneurysm, orthostatic hypotension, renal insufficiency, hypokalemia, and sinus tachycardia.  She was last seen by Dr. Rennis Golden on 03/01/2020 via virtual visit.  At that time she complained of dyspnea.  This was mostly with exertion however this was not always the case.  She indicated that she had not required midodrine for several months and had taken it 4 times over the last year.  Her home blood pressure readings seem to be somewhat labile.  It was noted that her blood pressures were normal on some  occasions with diastolics over 100 at times.  She mailed her blood pressure readings to the clinic they were reviewed and approved by Dr. Rennis Golden.  Her echocardiogram 03/19/2020 showed an LVEF of 45-50%, mild to moderate AI, aortic root dilation of 4.3 cm (previously 4.1 cm).  It was also felt that she may be having some arrhythmias.  She was started on furosemide 20 mg daily and low-dose metoprolol XL 12.5 mg daily.  She overall felt well, continue to be active and did not have any major complaints at that time.  She presents to the clinic today for follow-up evaluation and states she feels well.  She indicates that she mistakenly took 25 mg of metoprolol for several doses before she realized she was taking a whole pill twice per day instead of 1/2 pill twice per day.  She contacted the pharmacy and indicated that it was not written as prescribed.  The labeling was corrected and she has since taken her prescribed 12.5 mg metoprolol dosing twice daily.  She states she has much more energy and asks if she may be able to go dancing.  She also would like to know if she may have half of a beer occasionally.  I have instructed her that as long as she is being safe these activities would be appropriate.  I will have her follow-up with Dr. Rennis Golden in 6 months and as needed.  Today she denies chest pain, shortness of breath, lower extremity edema, fatigue,  palpitations, melena, hematuria, hemoptysis, diaphoresis, weakness, presyncope, syncope, orthopnea, and PND.  Home Medications    Prior to Admission medications   Medication Sig Start Date End Date Taking? Authorizing Provider  furosemide (LASIX) 20 MG tablet Take 1 tablet (20 mg total) by mouth daily. 03/22/20 06/20/20  Chrystie Nose, MD  metoprolol succinate (TOPROL-XL) 25 MG 24 hr tablet Take 0.5 tablets (12.5 mg total) by mouth every evening. 03/22/20   Hilty, Lisette Abu, MD  midodrine (PROAMATINE) 5 MG tablet Take 1 tablet (5 mg total) by mouth as needed  (systolic BP under 90 or diastolic BP under 50). 11/19/19   Hilty, Lisette Abu, MD  Multiple Vitamin (MULTIVITAMIN WITH MINERALS) TABS tablet Take 1 tablet by mouth every other day.    [provider]    Family History    Family History  Problem Relation Age of Onset  . Hypertension Mother    She indicated that her mother is deceased. She indicated that her father is deceased.  Social History    Social History   Socioeconomic History  . Marital status: Divorced    Spouse name: Not on file  . Number of children: Not on file  . Years of education: Not on file  . Highest education level: Not on file  Occupational History  . Not on file  Tobacco Use  . Smoking status: Never Smoker  . Smokeless tobacco: Never Used  Substance and Sexual Activity  . Alcohol use: No  . Drug use: No  . Sexual activity: Not on file  Other Topics Concern  . Not on file  Social History Narrative  . Not on file   Social Determinants of Health   Financial Resource Strain:   . Difficulty of Paying Living Expenses:   Food Insecurity:   . Worried About Programme researcher, broadcasting/film/video in the Last Year:   . Barista in the Last Year:   Transportation Needs:   . Freight forwarder (Medical):   Marland Kitchen Lack of Transportation (Non-Medical):   Physical Activity:   . Days of Exercise per Week:   . Minutes of Exercise per Session:   Stress:   . Feeling of Stress :   Social Connections:   . Frequency of Communication with Friends and Family:   . Frequency of Social Gatherings with Friends and Family:   . Attends Religious Services:   . Active Member of Clubs or Organizations:   . Attends Banker Meetings:   Marland Kitchen Marital Status:   Intimate Partner Violence:   . Fear of Current or Ex-Partner:   . Emotionally Abused:   Marland Kitchen Physically Abused:   . Sexually Abused:      Review of Systems    General:  No chills, fever, night sweats or weight changes.  Cardiovascular:  No chest pain, dyspnea  on exertion, edema, orthopnea, palpitations, paroxysmal nocturnal dyspnea. Dermatological: No rash, lesions/masses Respiratory: No cough, dyspnea Urologic: No hematuria, dysuria Abdominal:   No nausea, vomiting, diarrhea, bright red blood per rectum, melena, or hematemesis Neurologic:  No visual changes, wkns, changes in mental status. All other systems reviewed and are otherwise negative except as noted above.  Physical Exam    VS:  BP 138/88   Pulse 78   Ht 5\' 5"  (1.651 m)   Wt 118 lb 6.4 oz (53.7 kg)   SpO2 93%   BMI 19.70 kg/m  , BMI Body mass index is 19.7 kg/m. GEN: Well nourished, well developed, in  no acute distress. HEENT: normal. Neck: Supple, no JVD, carotid bruits, or masses. Cardiac: RRR, no murmurs, rubs, or gallops. No clubbing, cyanosis, edema.  Radials/DP/PT 2+ and equal bilaterally.  Respiratory:  Respirations regular and unlabored, clear to auscultation bilaterally. GI: Soft, nontender, nondistended, BS + x 4. MS: no deformity or atrophy. Skin: warm and dry, no rash. Neuro:  Strength and sensation are intact. Psych: Normal affect.  Accessory Clinical Findings    ECG personally reviewed by me today-none today.   Echocardiogram 03/19/2020 IMPRESSIONS    1. Left ventricular ejection fraction, by estimation, is 45 to 50%. The  left ventricle has mildly decreased function. The left ventricle  demonstrates global hypokinesis. Left ventricular diastolic parameters are  consistent with Grade I diastolic  dysfunction (impaired relaxation).  2. Right ventricular systolic function is normal. The right ventricular  size is mildly enlarged. There is mildly elevated pulmonary artery  systolic pressure. The estimated right ventricular systolic pressure is  36.6 mmHg.  3. Left atrial size was mildly dilated.  4. Right atrial size was mildly dilated.  5. The mitral valve is normal in structure. Trivial mitral valve  regurgitation.  6. Tricuspid valve  regurgitation is moderate.  7. The aortic valve is tricuspid. Aortic valve regurgitation is mild to  moderate. Mild to moderate aortic valve sclerosis/calcification is  present, without any evidence of aortic stenosis.  8. Aortic dilatation noted. There is mild dilatation of the aortic root  and of the ascending aorta measuring 43 mm.  9. The inferior vena cava is normal in size with greater than 50%  respiratory variability, suggesting right atrial pressure of 3 mmHg.   Assessment & Plan   1.  DOE-no increased dyspnea with exertion noted today.  Continues to be occasional in nature.  Echocardiogram  03/19/2020 showed an LVEF of 45-50%, mild to moderate AI, aortic root dilation of 4.3 cm (previously 4.1 cm). Continue furosemide, metoprolol, Heart healthy low-sodium diet-salty 6 given Increase physical activity as tolerated  History of syncope/orthostatic hypotension -chronic intermittent dizziness-BP today 138/88 Continue midodrine prn Change positions slowly-laying, sitting, standing  Dilated ascending aorta-echocardiogram 03/19/2020 showed aortic root dilation 4.3 cm. Repeat echocardiogram annually  Essential hypertension-BP today 138/88.  Well-controlled at home Continue metoprolol Heart healthy low-sodium diet Increase physical activity as tolerated Request PCP Launa Flight  Disposition: Follow-up with Dr. Rennis Golden in 6 months.   Thomasene Ripple. Erubiel Manasco NP-C    05/12/2020, 2:28 PM [facility] [address] Office [phone] Fax [phone]

## 2020-05-12 ENCOUNTER — Ambulatory Visit (INDEPENDENT_AMBULATORY_CARE_PROVIDER_SITE_OTHER): Payer: Medicare Other | Admitting: General Practice

## 2020-05-12 ENCOUNTER — Encounter: Payer: Self-pay | Admitting: General Practice

## 2020-05-12 ENCOUNTER — Other Ambulatory Visit: Payer: Self-pay

## 2020-05-12 VITALS — BP 138/88 | HR 78 | Ht 65.0 in | Wt 118.4 lb

## 2020-05-12 DIAGNOSIS — R0600 Dyspnea, unspecified: Secondary | ICD-10-CM

## 2020-05-12 DIAGNOSIS — I712 Thoracic aortic aneurysm, without rupture: Secondary | ICD-10-CM | POA: Diagnosis not present

## 2020-05-12 DIAGNOSIS — I951 Orthostatic hypotension: Secondary | ICD-10-CM

## 2020-05-12 DIAGNOSIS — I7121 Aneurysm of the ascending aorta, without rupture: Secondary | ICD-10-CM

## 2020-05-12 DIAGNOSIS — I10 Essential (primary) hypertension: Secondary | ICD-10-CM | POA: Diagnosis not present

## 2020-05-12 DIAGNOSIS — R0609 Other forms of dyspnea: Secondary | ICD-10-CM

## 2020-05-12 NOTE — Patient Instructions (Signed)
Medication Instructions:  The current medical regimen is effective;  continue present plan and medications as directed. Please refer to the Current Medication list given to you today. *If you need a refill on your cardiac medications before your next appointment, please call your pharmacy*  Follow-Up: Your next appointment:  6 month(s) Please call our office 2 months in advance to schedule this appointment In Person with K. Chad Hilty, MD  At [facility], you and your health needs are our priority.  As part of our continuing mission to provide you with exceptional heart care, we have created designated Provider Care Teams.  These Care Teams include your primary Cardiologist (physician) and Advanced Practice Providers (APPs -  Physician Assistants and Nurse Practitioners) who all work together to provide you with the care you need, when you need it.   

## 2020-08-25 ENCOUNTER — Telehealth: Payer: Self-pay | Admitting: Internal Medicine

## 2020-08-25 NOTE — Telephone Encounter (Signed)
Patient's daughter walked in with question about patient's lasix. She is going to [facility] in a few days and daughter is asking if it is OK for patient to miss taking lasix for a few days. It was advised that patient take medication(s) as prescribed. She has refills with [facility] per our records

## 2020-12-22 ENCOUNTER — Telehealth: Payer: Self-pay | Admitting: Internal Medicine

## 2020-12-22 NOTE — Telephone Encounter (Signed)
Pt c/o BP issue: STAT if pt c/o blurred vision, one-sided weakness or slurred speech  1. What are your last 5 BP readings? 123/107. 88/74, 129/94. 116/101 and 73/61  2. Are you having any other symptoms (ex. Dizziness, headache, blurred vision, passed out)? no  3. What is your BP issue? Blood pressure is up and down

## 2020-12-22 NOTE — Telephone Encounter (Signed)
Spoke to patient's daughter [name] and [name].They stated mother's B/P has been up and down.She complains of shaky and tired.Appointment scheduled with Edd Fabian NP 2/18 at 3:45 pm.Advised to bring all medications and B/P readings.

## 2020-12-23 NOTE — Progress Notes (Signed)
Cardiology Clinic Note   Patient Name: Stephanie Reyes Date of Encounter: 12/24/2020  Primary Care Provider:  Shon Hale, MD Primary Cardiologist:  Chrystie Nose, MD  Patient Profile    Stephanie Reyes 87 year old female presents the clinic today for follow-up evaluation of her labile  hypertension, and paroxysmal atrial fibrillation.  Past Medical History    Past Medical History:  Diagnosis Date  . Essential hypertension   . Premature atrial contractions   . Syncope    a. 02/2017 Echo: EF 55-60%, mild LVH, no rwma, Gr1 DD, triv AI, mildly dil Ao root, mild MR, mod TR, PASP .   Past Surgical History:  Procedure Laterality Date  . ANTERIOR APPROACH HEMI HIP ARTHROPLASTY Left 02/09/2018   Procedure: ANTERIOR APPROACH HEMI HIP ARTHROPLASTY;  Surgeon: Samson Frederic, MD;  Location: [facility] OR;  Service: Orthopedics;  Laterality: Left;  . HERNIA REPAIR    . TONSILLECTOMY      Allergies  Allergies  Allergen Reactions  . Fluvirin  [Influenza Virus Vaccine] Anaphylaxis  . Aspirin Nausea And Vomiting and Swelling  . Clindamycin/Lincomycin Nausea And Vomiting  . Hydrocodone Nausea And Vomiting    headache  . Penicillins Nausea And Vomiting and Swelling  . Ampicillin Nausea And Vomiting  . Eggs Or Egg-Derived Products     Egg whites Reports not being able to use albumin    History of Present Illness    Stephanie Reyes has a PMH of syncope, benign essential hypertension, paroxysmal atrial fibrillation, ascending aortic aneurysm, orthostatic hypotension, renal insufficiency, hypokalemia, and sinus tachycardia.  She was  seen by Dr. Rennis Golden on 03/01/2020 via virtual visit.  At that time she complained of dyspnea.  This was mostly with exertion however this was not always the case.  She indicated that she had not required midodrine for several months and had taken it 4 times over the last year.  Her home blood pressure readings seem to be somewhat labile.  It was noted  that her blood pressures were normal on some occasions with diastolics over 100 at times.  She mailed her blood pressure readings to the clinic they were reviewed and approved by Dr. Rennis Golden.  Her echocardiogram 03/19/2020 showed an LVEF of 45-50%, mild to moderate AI, aortic root dilation of 4.3 cm (previously 4.1 cm).  It was also felt that she may be having some arrhythmias.  She was started on furosemide 20 mg daily and low-dose metoprolol XL 12.5 mg daily.  She overall felt well, continue to be active and did not have any major complaints at that time.  She presented to the clinic 05/12/2020 for follow-up evaluation and stated she felt well.  She indicated that she mistakenly took 25 mg of metoprolol for several doses before she realized she was taking a whole pill twice per day instead of 1/2 pill twice per day.  She contacted the pharmacy and indicated that it was not written as prescribed.  The labeling was corrected and she had since taken her prescribed 12.5 mg metoprolol dosing twice daily.  She stated she had much more energy and asked if she may be able to go dancing.  She also wanted  to know if she could have half of a beer occasionally.  I  instructed her that as long as she was being safe the activities would be appropriate.  I planned her follow-up with Dr. Rennis Golden for 6 months and as needed.  Her daughter contacted the nurse triage line on  12/22/2020.  During that time she reported that her mother was having labile blood pressure.  She also noted that she was shaky and tired.  Last 5 blood pressure readings reported were 123/107, 88/74, 129/94, 116/101, and 73/61.  She presents the clinic today for follow-up evaluation and states she has been having episodes where she does not feel well and becomes shaky when her blood pressure is low.  She reports that she has had to take her midodrine twice since January.  She reports that she only had to take the medication 4 times last year.  She brings her  blood pressure log in today which shows blood pressures in the 80s over 60s-70s as well as blood pressures in the 110 over 60s-70s.  She reports on days when she feels well she continues to dance and stay physically active.  She again asks if she may have a half of a beer or 1 full beer occasionally.  I will discontinue her metoprolol, have her continue her blood pressure log, order a CBC and BMP, and have her follow-up in 1 month.  Today she denies chest pain, shortness of breath, lower extremity edema, fatigue, palpitations, melena, hematuria, hemoptysis, diaphoresis, presyncope, syncope, orthopnea, and PND.   Home Medications    Prior to Admission medications   Medication Sig Start Date End Date Taking? Authorizing Provider  furosemide (LASIX) 20 MG tablet Take 1 tablet (20 mg total) by mouth daily. 03/22/20 06/20/20  Chrystie Nose, MD  metoprolol succinate (TOPROL-XL) 25 MG 24 hr tablet Take 0.5 tablets (12.5 mg total) by mouth every evening. 03/22/20   Hilty, Lisette Abu, MD  midodrine (PROAMATINE) 5 MG tablet Take 1 tablet (5 mg total) by mouth as needed (systolic BP under 90 or diastolic BP under 50). 11/19/19   Hilty, Lisette Abu, MD  Multiple Vitamin (MULTIVITAMIN WITH MINERALS) TABS tablet Take 1 tablet by mouth every other day.    [provider]    Family History    Family History  Problem Relation Age of Onset  . Hypertension Mother    She indicated that her mother is deceased. She indicated that her father is deceased.  Social History    Social History   Socioeconomic History  . Marital status: Divorced    Spouse name: Not on file  . Number of children: Not on file  . Years of education: Not on file  . Highest education level: Not on file  Occupational History  . Not on file  Tobacco Use  . Smoking status: Never Smoker  . Smokeless tobacco: Never Used  Substance and Sexual Activity  . Alcohol use: No  . Drug use: No  . Sexual activity: Not on file  Other  Topics Concern  . Not on file  Social History Narrative  . Not on file   Social Determinants of Health   Financial Resource Strain: Not on file  Food Insecurity: Not on file  Transportation Needs: Not on file  Physical Activity: Not on file  Stress: Not on file  Social Connections: Not on file  Intimate Partner Violence: Not on file     Review of Systems    General:  No chills, fever, night sweats or weight changes.  Cardiovascular:  No chest pain, dyspnea on exertion, edema, orthopnea, palpitations, paroxysmal nocturnal dyspnea. Dermatological: No rash, lesions/masses Respiratory: No cough, dyspnea Urologic: No hematuria, dysuria Abdominal:   No nausea, vomiting, diarrhea, bright red blood per rectum, melena, or hematemesis Neurologic:  No  visual changes, wkns, changes in mental status. All other systems reviewed and are otherwise negative except as noted above.  Physical Exam    VS:  BP (!) 118/92   Pulse 82   Ht 5\' 4"  (1.626 m)   Wt 121 lb 3.2 oz (55 kg)   SpO2 92%   BMI 20.80 kg/m  , BMI Body mass index is 20.8 kg/m. GEN: Well nourished, well developed, in no acute distress. HEENT: normal. Neck: Supple, no JVD, carotid bruits, or masses. Cardiac: RRR, no murmurs, rubs, or gallops. No clubbing, cyanosis, edema.  Radials/DP/PT 2+ and equal bilaterally.  Respiratory:  Respirations regular and unlabored, clear to auscultation bilaterally. GI: Soft, nontender, nondistended, BS + x 4. MS: no deformity or atrophy. Skin: warm and dry, no rash. Neuro:  Strength and sensation are intact. Psych: Normal affect.  Accessory Clinical Findings    Recent Labs: No results found for requested labs within last 8760 hours.   Recent Lipid Panel No results found for: CHOL, TRIG, HDL, CHOLHDL, VLDL, LDLCALC, LDLDIRECT  ECG personally reviewed by me today-none today.  Echocardiogram 03/19/2020 IMPRESSIONS    1. Left ventricular ejection fraction, by estimation, is 45 to 50%.  The  left ventricle has mildly decreased function. The left ventricle  demonstrates global hypokinesis. Left ventricular diastolic parameters are  consistent with Grade I diastolic  dysfunction (impaired relaxation).  2. Right ventricular systolic function is normal. The right ventricular  size is mildly enlarged. There is mildly elevated pulmonary artery  systolic pressure. The estimated right ventricular systolic pressure is  36.6 mmHg.  3. Left atrial size was mildly dilated.  4. Right atrial size was mildly dilated.  5. The mitral valve is normal in structure. Trivial mitral valve  regurgitation.  6. Tricuspid valve regurgitation is moderate.  7. The aortic valve is tricuspid. Aortic valve regurgitation is mild to  moderate. Mild to moderate aortic valve sclerosis/calcification is  present, without any evidence of aortic stenosis.  8. Aortic dilatation noted. There is mild dilatation of the aortic root  and of the ascending aorta measuring 43 mm.  9. The inferior vena cava is normal in size with greater than 50%  respiratory variability, suggesting right atrial pressure of 3 mmHg.  Assessment & Plan   1.  Essential hypertension-BP today 118/92.  Also has previous history of syncope/orthostatic hypotension with chronic intermittent dizziness.  She previously took midodrine as needed  and was instructed to change positions slowly.  Her daughters contacted nurse triage line on 12/22/2020 and reported that she was having labile blood pressures. Continue  Furosemide Stop metoprolol Heart healthy low-sodium diet Increase physical activity as tolerated Order CBC and BMP Bp log  Dyspnea on exertion-reports no increased work of breathing or activity intolerance.  Reports she continues to have intermittent episodes of dyspnea which are unchanged from previous.  Echocardiogram 03/19/2020 showed LVEF of 45-50%, mild to moderate AI, aortic root dilation 4.3 cm. Continue  furosemide Heart healthy low-sodium diet-salty 6 given Increase physical activity as tolerated  Disposition: Follow-up with Dr. 03/21/2020 or me in 1 months.   Rennis Golden. Robert Sperl NP-C    12/24/2020, 3:45 PM [facility] [address] Office [phone] Fax [phone]  Notice: This dictation was prepared with Dragon dictation along with smaller phrase technology. Any transcriptional errors that result from this process are unintentional and may not be corrected upon review.  I spent 10 minutes examining this patient, reviewing medications, and using patient centered  shared decision making involving her cardiac care.  Prior to her visit I spent greater than 20 minutes reviewing her past medical history,  medications, and prior cardiac tests.

## 2020-12-24 ENCOUNTER — Encounter: Payer: Self-pay | Admitting: General Practice

## 2020-12-24 ENCOUNTER — Other Ambulatory Visit: Payer: Self-pay

## 2020-12-24 ENCOUNTER — Ambulatory Visit (INDEPENDENT_AMBULATORY_CARE_PROVIDER_SITE_OTHER): Payer: Medicare Other | Admitting: General Practice

## 2020-12-24 VITALS — BP 118/92 | HR 82 | Ht 64.0 in | Wt 121.2 lb

## 2020-12-24 DIAGNOSIS — R0600 Dyspnea, unspecified: Secondary | ICD-10-CM

## 2020-12-24 DIAGNOSIS — I10 Essential (primary) hypertension: Secondary | ICD-10-CM | POA: Diagnosis not present

## 2020-12-24 DIAGNOSIS — R0609 Other forms of dyspnea: Secondary | ICD-10-CM

## 2020-12-24 DIAGNOSIS — Z79899 Other long term (current) drug therapy: Secondary | ICD-10-CM | POA: Diagnosis not present

## 2020-12-24 LAB — BASIC METABOLIC PANEL

## 2020-12-24 LAB — CBC WITH DIFFERENTIAL/PLATELET
Eos: 3 %
MCV: 96 fL (ref 79–97)
Neutrophils: 54 %
Platelets: 210 10*3/uL (ref 150–450)

## 2020-12-24 NOTE — Patient Instructions (Signed)
Medication Instructions:  STOP METOPROLOL CONTINUE FUROSEMIDE 20 *If you need a refill on your cardiac medications before your next appointment, please call your pharmacy*  Lab Work: CBC AND BMET TODAY If you have labs (blood work) drawn today and your tests are completely normal, you will receive your results only by:  MyChart Message (if you have MyChart) OR A paper copy in the mail.  If you have any lab test that is abnormal or we need to change your treatment, we will call you to review the results. You may go to any [facility] that is convenient for you however, we do have a lab in our office that is able to assist you. You DO NOT need an appointment for our lab. The lab is open 8:00am and closes at 4:00pm. Lunch 12:45 - 1:45pm.  Special Instructions TAKE AND LOG YOU BLOOD PRESSURE AND PULSE-BRING BACK WITH YOU TO YOUR FOLLOW UP APPOINTMENT  Follow-Up: Your next appointment:  1 month(s) In Person with K. [place] Hilty, MD OR IF UNAVAILABLE JESSE CLEAVER, FNP-C  At [facility], you and your health needs are our priority.  As part of our continuing mission to provide you with exceptional heart care, we have created designated Provider Care Teams.  These Care Teams include your primary Cardiologist (physician) and Advanced Practice Providers (APPs -  Physician Assistants and Nurse Practitioners) who all work together to provide you with the care you need, when you need it.

## 2020-12-25 LAB — CBC WITH DIFFERENTIAL/PLATELET
Basophils Absolute: 0.1 10*3/uL (ref 0.0–0.2)
Basos: 1 %
EOS (ABSOLUTE): 0.2 10*3/uL (ref 0.0–0.4)
Hematocrit: 40.7 % (ref 34.0–46.6)
Hemoglobin: 14 g/dL (ref 11.1–15.9)
Immature Grans (Abs): 0 10*3/uL (ref 0.0–0.1)
Immature Granulocytes: 0 %
Lymphocytes Absolute: 1.8 10*3/uL (ref 0.7–3.1)
Lymphs: 31 %
MCH: 32.9 pg (ref 26.6–33.0)
MCHC: 34.4 g/dL (ref 31.5–35.7)
Monocytes Absolute: 0.6 10*3/uL (ref 0.1–0.9)
Monocytes: 11 %
Neutrophils Absolute: 3.3 10*3/uL (ref 1.4–7.0)
RBC: 4.25 x10E6/uL (ref 3.77–5.28)
RDW: 11.5 % — ABNORMAL LOW (ref 11.7–15.4)
WBC: 6 10*3/uL (ref 3.4–10.8)

## 2020-12-25 LAB — BASIC METABOLIC PANEL
BUN/Creatinine Ratio: 20 (ref 12–28)
BUN: 23 mg/dL (ref 10–36)
CO2: 24 mmol/L (ref 20–29)
Calcium: 9.8 mg/dL (ref 8.7–10.3)
Chloride: 102 mmol/L (ref 96–106)
Creatinine, Ser: 1.13 mg/dL — ABNORMAL HIGH (ref 0.57–1.00)
GFR calc Af Amer: 48 mL/min/{1.73_m2} — ABNORMAL LOW (ref >59)
GFR calc non Af Amer: 42 mL/min/{1.73_m2} — ABNORMAL LOW (ref >59)
Potassium: 5 mmol/L (ref 3.5–5.2)

## 2021-01-21 ENCOUNTER — Ambulatory Visit: Payer: Medicare Other | Admitting: General Practice

## 2021-01-22 ENCOUNTER — Other Ambulatory Visit: Payer: Self-pay | Admitting: Internal Medicine

## 2021-01-26 NOTE — Progress Notes (Signed)
Virtual Visit via Telephone Note   This visit type was conducted due to national recommendations for restrictions regarding the COVID-19 Pandemic (e.g. social distancing) in an effort to limit this patient's exposure and mitigate transmission in our community.  Due to her co-morbid illnesses, this patient is at least at moderate risk for complications without adequate follow up.  This format is felt to be most appropriate for this patient at this time.  The patient did not have access to video technology/had technical difficulties with video requiring transitioning to audio format only (telephone).  All issues noted in this document were discussed and addressed.  No physical exam could be performed with this format.  Please refer to the patient's chart for her  consent to tele[facility].  Evaluation Performed:  Follow-up visit  This visit type was conducted due to national recommendations for restrictions regarding the COVID-19 Pandemic (e.g. social distancing).  This format is felt to be most appropriate for this patient at this time.  All issues noted in this document were discussed and addressed.  No physical exam was performed (except for noted visual exam findings with Video Visits).  Please refer to the patient's chart (MyChart message for video visits and phone note for telephone visits) for the patient's consent to telehealth [facility]  Date:  01/28/2021   ID:  Stephanie Reyes, DOB 1923-11-22, MRN [id number]  Patient Location[address]  [address] [name]   Provider location:     [facility] [address] Office [phone] Fax [phone]   PCP:  Shon Haleimberlake, Kathryn S, MD  Cardiologist:  Chrystie NoseKenneth C Hilty, MD  Electrophysiologist:  None   Chief Complaint: Follow-up for labile hypertension  History of Present Illness:    Stephanie Reyes is a 87 y.o. female who presents via  audio/video conferencing for a telehealth visit today.  Patient verified DOB and address.  She has a PMH of syncope, benign essential hypertension, paroxysmal atrial fibrillation, ascending aortic aneurysm, orthostatic hypotension, renal insufficiency, hypokalemia, and sinus tachycardia.  She was  seen by Dr. Rennis GoldenHilty on 03/01/2020 via virtual visit. At that time she complained of dyspnea. This was mostly with exertion however this was not always the case. She indicated that she had not required midodrine for several months and had taken it 4 times over the last year. Her home blood pressure readings seem to be somewhat labile. It was noted that her blood pressures were normal on some occasions with diastolics over 100 at times. She mailed her blood pressure readings to the clinic they were reviewed and approved by Dr. Rennis GoldenHilty. Her echocardiogram 03/19/2020 showed an LVEF of 45-50%, mild to moderate AI, aortic root dilation of 4.3 cm (previously 4.1 cm). It was also felt that she may be having some arrhythmias. She was started on furosemide 20 mg daily and low-dose metoprolol XL 12.5 mg daily. She overall felt well, continue to be active and did not have any major complaints at that time.  She presented to the clinic 05/12/2020 for follow-up evaluation and statedshe felt well. She indicated that she mistakenly took 25 mg of metoprolol for several doses before she realized she was taking a whole pill twice per day instead of 1/2 pill twice per day. She contacted the pharmacy and indicated that it was not written as prescribed. The labeling was corrected and she had since taken her prescribed 12.5 mg metoprolol dosing twice daily. She  stated she had much more energy and asked if she may be able to go dancing. She also wanted  to know if she could have half of a beer occasionally. I  instructed her that as long as she was being safe the activities would be appropriate. I planned her follow-up with Dr.  Rennis Golden for 6 months and as needed.  Her daughter contacted the nurse triage line on 12/22/2020.  During that time she reported that her mother was having labile blood pressure.  She also noted that she was shaky and tired.  Last 5 blood pressure readings reported were 123/107, 88/74, 129/94, 116/101, and 73/61.  She presented the clinic 12/24/20 for follow-up evaluation and stated she had been having episodes where she did not feel well and became shaky when her blood pressure was low.  She reported that she  had to take her midodrine twice since January.  She reported that she only had to take the medication 4 times last year.  She brought her blood pressure log in  which showed blood pressures in the 80s over 60s-70s as well as blood pressures in the 110 over 60s-70s.  She reported on days when she felt well she continued to dance and stay physically active.  She again asked if she may have a half of a beer or 1 full beer occasionally.  I  discontinued her metoprolol, had her continue her blood pressure log, ordered a CBC and BMP, and planned follow-up in 1 month.  Her creatinine had increased to 1.13.  She was instructed to increase her p.o. hydration.  Her other labs were unremarkable.  She is seen today virtually in follow-up and states she feels well.  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  She recently had over $700 donated to help with processes involving tiny houses.  She reports that she continues to stay very physically active dancing in the morning and walking up and down her apartment hallway 12 times daily.  She reports that she does not hydrate well at times.  I have asked her to try to drink around 48-60 ounces daily.  I have also recommended that she wear lower extremity support stockings to see if it will help with her fluctuations in blood pressures.  We will have her follow-up with Dr. Rennis Golden in 3 months.  I will have her maintain her blood pressure  log and check her blood pressure 1-2 times per week and if she feels better.  Today she denies chest pain, shortness of breath, lower extremity edema, fatigue, palpitations, melena, hematuria, hemoptysis, diaphoresis, presyncope, syncope, orthopnea, and PND.  The patient does not symptoms concerning for COVID-19 infection (fever, chills, cough, or new SHORTNESS OF BREATH).    Prior CV studies:   The following studies were reviewed today:  Echocardiogram 03/19/2020 IMPRESSIONS    1. Left ventricular ejection fraction, by estimation, is 45 to 50%. The  left ventricle has mildly decreased function. The left ventricle  demonstrates global hypokinesis. Left ventricular diastolic parameters are  consistent with Grade I diastolic  dysfunction (impaired relaxation).  2. Right ventricular systolic function is normal. The right ventricular  size is mildly enlarged. There is mildly elevated pulmonary artery  systolic pressure. The estimated right ventricular systolic pressure is  36.6 mmHg.  3. Left atrial size was mildly dilated.  4. Right atrial size was mildly dilated.  5. The mitral valve is normal in structure. Trivial mitral valve  regurgitation.  6. Tricuspid valve regurgitation is moderate.  7. The aortic valve is tricuspid. Aortic valve regurgitation is mild to  moderate. Mild to moderate aortic valve sclerosis/calcification is  present, without any evidence of aortic stenosis.  8. Aortic dilatation noted. There is mild dilatation of the aortic root  and of the ascending aorta measuring 43 mm.  9. The inferior vena cava is normal in size with greater than 50%  respiratory variability, suggesting right atrial pressure of 3 mmHg.  Past Medical History:  Diagnosis Date  . Essential hypertension   . Premature atrial contractions   . Syncope    a. 02/2017 Echo: EF 55-60%, mild LVH, no rwma, Gr1 DD, triv AI, mildly dil Ao root, mild MR, mod TR, PASP .   Past Surgical  History:  Procedure Laterality Date  . ANTERIOR APPROACH HEMI HIP ARTHROPLASTY Left 02/09/2018   Procedure: ANTERIOR APPROACH HEMI HIP ARTHROPLASTY;  Surgeon: Samson Frederic, MD;  Location: [facility] OR;  Service: Orthopedics;  Laterality: Left;  . HERNIA REPAIR    . TONSILLECTOMY       Current Meds  Medication Sig  . furosemide (LASIX) 20 MG tablet Take 1 tablet (20 mg total) by mouth daily.  . midodrine (PROAMATINE) 5 MG tablet Take 1 tablet (5 mg total) by mouth as needed (systolic BP under 90 or diastolic BP under 50).  . Multiple Vitamin (MULTIVITAMIN WITH MINERALS) TABS tablet Take 1 tablet by mouth every other day.     Allergies:   Fluvirin  [influenza virus vaccine], Aspirin, Clindamycin/lincomycin, Hydrocodone, Penicillins, Ampicillin, and Eggs or egg-derived products   Social History   Tobacco Use  . Smoking status: Never Smoker  . Smokeless tobacco: Never Used  Substance Use Topics  . Alcohol use: No  . Drug use: No     Family Hx: The patient's family history includes Hypertension in her mother.  ROS:   Please see the history of present illness.     All other systems reviewed and are negative.   Labs/Other Tests and Data Reviewed:    Recent Labs: 12/24/2020: BUN 23; Creatinine, Ser 1.13; Hemoglobin 14.0; Platelets 210; Potassium 5.0; Sodium 142   Recent Lipid Panel No results found for: CHOL, TRIG, HDL, CHOLHDL, LDLCALC, LDLDIRECT  Wt Readings from Last 3 Encounters:  01/28/21 121 lb (54.9 kg)  12/24/20 121 lb 3.2 oz (55 kg)  05/12/20 118 lb 6.4 oz (53.7 kg)     Exam:    Vital Signs:  BP (!) 133/105   Pulse 65   Ht 5\' 4"  (1.626 m)   Wt 121 lb (54.9 kg)   BMI 20.77 kg/m    Well nourished, well developed female in no  acute distress.   ASSESSMENT & PLAN:    1.  Essential hypertension-BP .  Blood pressure log at home shows blood pressures in the 115-1 10 over 60s-80 range.  Also has previous history of syncope/orthostatic hypotension with  chronic intermittent dizziness.  She continues to take midodrine as needed.  Instructed to change positions slowly. Increase p.o. hydration Lower extremity support stockings Heart healthy low-sodium diet Maintain physical activity as tolerated-walking and dancing at home Maintain Bp log-1-2 times per week and with symptoms.  Dyspnea on exertion-chronic stable  episodes of dyspnea.   Echocardiogram 03/19/2020 showed LVEF of 45-50%, mild to moderate AI, aortic root dilation 4.3 cm. Continue furosemide Heart healthy low-sodium diet-salty 6 given Increase physical activity as tolerated  Disposition: Follow-up with Dr. 03/21/2020  in 3 months.  COVID-19 Education:  The signs and symptoms of COVID-19 were discussed with the patient and how to seek care for testing (follow up with PCP or arrange E-visit).  The importance of social distancing was discussed today.  Patient Risk:   After full review of this patients clinical status, I feel that they are at least moderate risk at this time.  Time:   Today, I have spent 14 minutes with the patient with telehealth technology discussing diet, exercise, medication, blood pressure log, lower extremity support stockings, p.o. hydration.     Medication Adjustments/Labs and Tests Ordered: Current medicines are reviewed at length with the patient today.  Concerns regarding medicines are outlined above.   Tests Ordered: No orders of the defined types were placed in this encounter.  Medication Changes: No orders of the defined types were placed in this encounter.   Disposition:  in 3 month(s)  Signed, Thomasene Ripple. Collier Monica NP-C    06/10/2019 11:58 AM    [facility] [address] Office [phone] Fax [phone]

## 2021-01-28 ENCOUNTER — Encounter: Payer: Self-pay | Admitting: General Practice

## 2021-01-28 ENCOUNTER — Telehealth (INDEPENDENT_AMBULATORY_CARE_PROVIDER_SITE_OTHER): Payer: Medicare Other | Admitting: General Practice

## 2021-01-28 VITALS — BP 133/105 | HR 65 | Ht 64.0 in | Wt 121.0 lb

## 2021-01-28 DIAGNOSIS — R0600 Dyspnea, unspecified: Secondary | ICD-10-CM

## 2021-01-28 DIAGNOSIS — I10 Essential (primary) hypertension: Secondary | ICD-10-CM | POA: Diagnosis not present

## 2021-01-28 DIAGNOSIS — Z79899 Other long term (current) drug therapy: Secondary | ICD-10-CM | POA: Diagnosis not present

## 2021-01-28 DIAGNOSIS — R0989 Other specified symptoms and signs involving the circulatory and respiratory systems: Secondary | ICD-10-CM

## 2021-01-28 DIAGNOSIS — R0609 Other forms of dyspnea: Secondary | ICD-10-CM

## 2021-01-28 NOTE — Patient Instructions (Signed)
Medication Instructions:  Your physician recommends that you continue on your current medications as directed. Please refer to the Current Medication list given to you today.  *If you need a refill on your cardiac medications before your next appointment, please call your pharmacy*  Follow-Up: At [facility], you and your health needs are our priority.  As part of our continuing mission to provide you with exceptional heart care, we have created designated Provider Care Teams.  These Care Teams include your primary Cardiologist (physician) and Advanced Practice Providers (APPs -  Physician Assistants and Nurse Practitioners) who all work together to provide you with the care you need, when you need it.  Your next appointment:   3 month(s)  The format for your next appointment:   In Person  Provider:   You may see Chrystie Nose, MD or one of the following Advanced Practice Providers on your designated Care Team:    Azalee Course, PA-C  Micah Flesher, [place] or   Judy Pimple, [place]  Other Instructions 1) Make sure you are staying hydrated 2) Continue physical activity as tolerated 3) Wear support stockings - You can purchase these at [facility]  [employer] in [place] [phone] 4) Check your blood pressure 1-2 times weekly, also when you are feeling bad and keep a log of the readings.

## 2021-03-15 ENCOUNTER — Other Ambulatory Visit: Payer: Self-pay | Admitting: Internal Medicine

## 2021-04-28 ENCOUNTER — Telehealth (INDEPENDENT_AMBULATORY_CARE_PROVIDER_SITE_OTHER): Payer: Medicare Other | Admitting: Internal Medicine

## 2021-04-28 ENCOUNTER — Encounter: Payer: Self-pay | Admitting: Internal Medicine

## 2021-04-28 VITALS — BP 127/87 | HR 70 | Ht 64.0 in | Wt 119.0 lb

## 2021-04-28 DIAGNOSIS — I5022 Chronic systolic (congestive) heart failure: Secondary | ICD-10-CM | POA: Diagnosis not present

## 2021-04-28 DIAGNOSIS — I7121 Aneurysm of the ascending aorta, without rupture: Secondary | ICD-10-CM

## 2021-04-28 DIAGNOSIS — I712 Thoracic aortic aneurysm, without rupture: Secondary | ICD-10-CM

## 2021-04-28 DIAGNOSIS — I951 Orthostatic hypotension: Secondary | ICD-10-CM

## 2021-04-28 NOTE — Patient Instructions (Signed)
Medication Instructions:  No Changes In Medications at this time.  *If you need a refill on your cardiac medications before your next appointment, please call your pharmacy*  Follow-Up: At [facility], you and your health needs are our priority.  As part of our continuing mission to provide you with exceptional heart care, we have created designated Provider Care Teams.  These Care Teams include your primary Cardiologist (physician) and Advanced Practice Providers (APPs -  Physician Assistants and Nurse Practitioners) who all work together to provide you with the care you need, when you need it.  Your next appointment:   1 year(s)  The format for your next appointment:   In Person  Provider:   K. Chad Hilty, MD 

## 2021-04-28 NOTE — Progress Notes (Signed)
Virtual Visit via Telephone Note   This visit type was conducted due to national recommendations for restrictions regarding the COVID-19 Pandemic (e.g. social distancing) in an effort to limit this patient's exposure and mitigate transmission in our community.  Due to her co-morbid illnesses, this patient is at least at moderate risk for complications without adequate follow up.  This format is felt to be most appropriate for this patient at this time.  The patient did not have access to video technology/had technical difficulties with video requiring transitioning to audio format only (telephone).  All issues noted in this document were discussed and addressed.  No physical exam could be performed with this format.  Please refer to the patient's chart for her  consent to telehealth for [facility].   Evaluation Performed: Telephone follow-up  Date:  04/28/2021   ID:  Stephanie Reyes, DOB 28-Jul-1924, MRN [id number]  Patient Location:  [place] [address]  Provider location:   [address] [place], [place] [address]  PCP:  Glenis Smoker, MD  Cardiologist:  Pixie Casino, MD Electrophysiologist:  None   Chief Complaint: Worsening shortness of breath and blood pressure fluctuation  History of Present Illness:    Stephanie Reyes is a 87 y.o. female who presents via audio/video conferencing for a telehealth visit today.  Stephanie Reyes is a 87 y.o. female with a past medial history significant for essential hypertension, PACs and a syncopal event. I first met her in the hospital after she suffered a syncopal event was found to have frequent ectopy on her monitor. There was however no evidence for atrial fibrillation. Subsequently we placed an outpatient monitor which showed no evidence of A. fib. She did undergo an echocardiogram which showed mild LVH, EF 62-37%, diastolic dysfunction and moderate pulmonary hypertension. The aortic root was  mildly dilated to 4.1 cm. She followed up with [name], NP, who noted she was still hypertensive and increase her amlodipine from 2.5-5 mg daily. She reports intolerance to lisinopril in the past and is also on metoprolol. Blood pressure remains elevated today 151/91 and she reports some lower extremity swelling and shortness of breath with exertion. She denies any further syncopal episodes.  11/16/2017   Stephanie Reyes returns today for follow-up. She reports feeling well. Her edema has improved on chlorthalidone, but her right foot still swells some. She has better BP control in general. She is still very active, despite almost turning 89. She denies chest pain or dyspnea.   04/05/2018  Stephanie Reyes returns today for follow-up.  Fortunately she recently had a fall.  She did not syncopized although felt dizzy.  She fractured her left hip and underwent hemi-arthroplasty.  Since then she has been recovering well although had problems with orthostatic hypotension.  In the hospital Dr. Marlou Porch stopped her metoprolol, amlodipine and chlorthalidone.  Blood pressure remained low when she was placed on midodrine.  She was instructed to take this twice daily although blood pressures were quite elevated on the midodrine at 628-315 systolic over 17-616 diastolic.  She was then instructed to come off the medication and use it as needed.  Since then her blood pressures have been essentially normal with only occasional blood pressures in the 90s for which she was given the medication.  She remains asymptomatic and is not had no syncopal episodes.  08/07/2019  Stephanie Reyes is seen today in follow-up.  I am pleased to report she has had no further syncopal  episodes.  She says she is only used about 5 midodrine pills over the past year.  She brought in blood pressures which indicate her blood pressures typically in the 881J systolic over 03P and 59Y.  She did have one episode with a blood pressure was at 85/49.  She took  midodrine and noted marked rapid improvement in her blood pressure.  Overall she is very pleased at how she is feeling and happy to be off medication.  03/01/2020  Stephanie Reyes was seen today in follow-up. She complains of some dyspnea, but mostly with exertion and not all the time.  She says that she has not required midodrine at all this year and took it maybe 4 times last year.  In general home blood pressure readings seem to be somewhat labile.  There are some normal blood pressure readings and at times diastolics over 585.  More recently it seems that her blood pressures are little more elevated, but she is on no blood pressure medication.  Her last echo was in 2018 which showed normal systolic function, grade 1 diastolic dysfunction, dilated aortic root to 4.1 cm and moderate pulmonary hypertension.  This will require follow-up, especially with some shortness of breath complaints.  Besides this, however she says she feels well and is active and does not have any complaints especially given her age of 12.  04/28/2021     The patient does not have symptoms concerning for COVID-19 infection (fever, chills, cough, or new SHORTNESS OF BREATH).    Prior CV studies:   The following studies were reviewed today:  Heart reviewed, lab work  PMHx:  Past Medical History:  Diagnosis Date   Essential hypertension    Premature atrial contractions    Syncope    a. 02/2017 Echo: EF 55-60%, mild LVH, no rwma, Gr1 DD, triv AI, mildly dil Ao root, mild MR, mod TR, PASP 25mHg.    Past Surgical History:  Procedure Laterality Date   ANTERIOR APPROACH HEMI HIP ARTHROPLASTY Left 02/09/2018   Procedure: ANTERIOR APPROACH HEMI HIP ARTHROPLASTY;  Surgeon: SRod Can MD;  [place]  Service: Orthopedics;  Laterality: Left;   HERNIA REPAIR     TONSILLECTOMY      FAMHx:  Family History  Problem Relation Age of Onset   Hypertension Mother     SOCHx:   reports that she has never smoked. She  has never used smokeless tobacco. She reports that she does not drink alcohol and does not use drugs.  ALLERGIES:  Allergies  Allergen Reactions   Fluvirin  [Influenza Virus Vaccine] Anaphylaxis   Aspirin Nausea And Vomiting and Swelling   Clindamycin/Lincomycin Nausea And Vomiting   Hydrocodone Nausea And Vomiting    headache   Penicillins Nausea And Vomiting and Swelling   Ampicillin Nausea And Vomiting   Eggs Or Egg-Derived Products     Egg whites Reports not being able to use albumin    MEDS:  Current Meds  Medication Sig   furosemide (LASIX) 20 MG tablet TAKE 1 TABLET BY MOUTH EVERY DAY   midodrine (PROAMATINE) 5 MG tablet Take 1 tablet (5 mg total) by mouth as needed (systolic BP under 90 or diastolic BP under 50).   Multiple Vitamin (MULTIVITAMIN WITH MINERALS) TABS tablet Take 1 tablet by mouth every other day.     ROS: Pertinent items noted in HPI and remainder of comprehensive ROS otherwise negative.  Labs/Other Tests and Data Reviewed:    Recent Labs: 12/24/2020: BUN 23; Creatinine,  Ser 1.13; Hemoglobin 14.0; Platelets 210; Potassium 5.0; Sodium 142   Recent Lipid Panel No results found for: CHOL, TRIG, HDL, CHOLHDL, LDLCALC, LDLDIRECT  Wt Readings from Last 3 Encounters:  04/28/21 119 lb (54 kg)  01/28/21 121 lb (54.9 kg)  12/24/20 121 lb 3.2 oz (55 kg)     Exam:    Vital Signs:  BP 127/87   Pulse 70   Ht 5' 4"  (1.626 m)   Wt 119 lb (54 kg)   BMI 20.43 kg/m    Exam not performed due to telephone visit  ASSESSMENT & PLAN:    Chronic systolic heart failure, LVEF 45-50% History of syncope Orthostatic hypotension Frequent PACs Dilated ascending aorta to 4.1 cm Hypertension  Stephanie Reyes seems to be doing quite well.  She denies any shortness of breath or any heart failure symptoms.  She reports having only taken the midodrine and 3 times last year and once this year.  She had been taken off of her metoprolol which seem to worsen some of her  orthostatic symptoms.  Unfortunately her LVEF is slightly reduced at 45 to 50% and will likely remain there or possibly get worse without medications however they have not been tolerated for her.  Although given her age of 7 she seems to be doing very well.  No changes to her medications today.  Continue furosemide in the morning.  Follow-up annually or sooner as necessary.  COVID-19 Education: The signs and symptoms of COVID-19 were discussed with the patient and how to seek care for testing (follow up with PCP or arrange E-visit).  The importance of social distancing was discussed today.  Patient Risk:   After full review of this patients clinical status, I feel that they are at least moderate risk at this time.  Time:   Today, I have spent 25 minutes with the patient with telehealth technology discussing shortness of breath, labile blood pressure, dilated ascending aorta.     Medication Adjustments/Labs and Tests Ordered: Current medicines are reviewed at length with the patient today.  Concerns regarding medicines are outlined above.   Tests Ordered: No orders of the defined types were placed in this encounter.   Medication Changes: No orders of the defined types were placed in this encounter.   Disposition:  in 1 year(s)  Pixie Casino, MD, [facility], [place] Director of the Advanced Lipid Disorders &  Cardiovascular Risk Reduction Clinic Diplomate of the American Board of Clinical Lipidology Attending Cardiologist  Direct Dial: [phone]  Fax: [phone]  Website:  [email or web address]  Pixie Casino, MD  04/28/2021 9:17 AM

## 2021-07-27 ENCOUNTER — Emergency Department (HOSPITAL_COMMUNITY): Payer: Medicare Other

## 2021-07-27 ENCOUNTER — Observation Stay (HOSPITAL_COMMUNITY): Payer: Medicare Other

## 2021-07-27 ENCOUNTER — Other Ambulatory Visit: Payer: Self-pay

## 2021-07-27 ENCOUNTER — Inpatient Hospital Stay (HOSPITAL_COMMUNITY)
Admission: EM | Admit: 2021-07-27 | Discharge: 2021-07-29 | DRG: 308 | Disposition: A | Discharge status: Home Health | Payer: Medicare Other | Attending: Internal Medicine | Admitting: Internal Medicine

## 2021-07-27 DIAGNOSIS — I5032 Chronic diastolic (congestive) heart failure: Secondary | ICD-10-CM | POA: Diagnosis present

## 2021-07-27 DIAGNOSIS — Z880 Allergy status to penicillin: Secondary | ICD-10-CM | POA: Diagnosis not present

## 2021-07-27 DIAGNOSIS — Z96642 Presence of left artificial hip joint: Secondary | ICD-10-CM | POA: Diagnosis present

## 2021-07-27 DIAGNOSIS — E876 Hypokalemia: Secondary | ICD-10-CM | POA: Diagnosis not present

## 2021-07-27 DIAGNOSIS — G8314 Monoplegia of lower limb affecting left nondominant side: Secondary | ICD-10-CM | POA: Diagnosis not present

## 2021-07-27 DIAGNOSIS — Z66 Do not resuscitate: Secondary | ICD-10-CM | POA: Diagnosis not present

## 2021-07-27 DIAGNOSIS — R296 Repeated falls: Secondary | ICD-10-CM | POA: Diagnosis present

## 2021-07-27 DIAGNOSIS — E785 Hyperlipidemia, unspecified: Secondary | ICD-10-CM | POA: Diagnosis present

## 2021-07-27 DIAGNOSIS — Z91012 Allergy to eggs: Secondary | ICD-10-CM

## 2021-07-27 DIAGNOSIS — I6340 Cerebral infarction due to embolism of unspecified cerebral artery: Secondary | ICD-10-CM | POA: Diagnosis not present

## 2021-07-27 DIAGNOSIS — Z79899 Other long term (current) drug therapy: Secondary | ICD-10-CM | POA: Diagnosis not present

## 2021-07-27 DIAGNOSIS — I4891 Unspecified atrial fibrillation: Secondary | ICD-10-CM | POA: Diagnosis not present

## 2021-07-27 DIAGNOSIS — Z20822 Contact with and (suspected) exposure to covid-19: Secondary | ICD-10-CM | POA: Diagnosis not present

## 2021-07-27 DIAGNOSIS — H9193 Unspecified hearing loss, bilateral: Secondary | ICD-10-CM | POA: Diagnosis present

## 2021-07-27 DIAGNOSIS — R55 Syncope and collapse: Secondary | ICD-10-CM

## 2021-07-27 DIAGNOSIS — I480 Paroxysmal atrial fibrillation: Secondary | ICD-10-CM

## 2021-07-27 DIAGNOSIS — Z681 Body mass index (BMI) 19 or less, adult: Secondary | ICD-10-CM | POA: Diagnosis not present

## 2021-07-27 DIAGNOSIS — Z888 Allergy status to other drugs, medicaments and biological substances status: Secondary | ICD-10-CM | POA: Diagnosis not present

## 2021-07-27 DIAGNOSIS — M25552 Pain in left hip: Secondary | ICD-10-CM | POA: Diagnosis not present

## 2021-07-27 DIAGNOSIS — I63413 Cerebral infarction due to embolism of bilateral middle cerebral arteries: Secondary | ICD-10-CM | POA: Diagnosis not present

## 2021-07-27 DIAGNOSIS — R0689 Other abnormalities of breathing: Secondary | ICD-10-CM | POA: Diagnosis not present

## 2021-07-27 DIAGNOSIS — M47812 Spondylosis without myelopathy or radiculopathy, cervical region: Secondary | ICD-10-CM | POA: Diagnosis not present

## 2021-07-27 DIAGNOSIS — S50811A Abrasion of right forearm, initial encounter: Secondary | ICD-10-CM | POA: Diagnosis not present

## 2021-07-27 DIAGNOSIS — Z743 Need for continuous supervision: Secondary | ICD-10-CM | POA: Diagnosis not present

## 2021-07-27 DIAGNOSIS — R64 Cachexia: Secondary | ICD-10-CM | POA: Diagnosis present

## 2021-07-27 DIAGNOSIS — I272 Pulmonary hypertension, unspecified: Secondary | ICD-10-CM | POA: Diagnosis not present

## 2021-07-27 DIAGNOSIS — I6389 Other cerebral infarction: Secondary | ICD-10-CM | POA: Diagnosis not present

## 2021-07-27 DIAGNOSIS — W1830XA Fall on same level, unspecified, initial encounter: Secondary | ICD-10-CM | POA: Diagnosis present

## 2021-07-27 DIAGNOSIS — Z881 Allergy status to other antibiotic agents status: Secondary | ICD-10-CM

## 2021-07-27 DIAGNOSIS — R29701 NIHSS score 1: Secondary | ICD-10-CM | POA: Diagnosis present

## 2021-07-27 DIAGNOSIS — Z886 Allergy status to analgesic agent status: Secondary | ICD-10-CM

## 2021-07-27 DIAGNOSIS — I444 Left anterior fascicular block: Secondary | ICD-10-CM | POA: Diagnosis not present

## 2021-07-27 DIAGNOSIS — I499 Cardiac arrhythmia, unspecified: Secondary | ICD-10-CM | POA: Diagnosis not present

## 2021-07-27 DIAGNOSIS — I110 Hypertensive heart disease with heart failure: Secondary | ICD-10-CM | POA: Diagnosis present

## 2021-07-27 DIAGNOSIS — Q283 Other malformations of cerebral vessels: Secondary | ICD-10-CM | POA: Diagnosis not present

## 2021-07-27 DIAGNOSIS — R54 Age-related physical debility: Secondary | ICD-10-CM | POA: Diagnosis present

## 2021-07-27 DIAGNOSIS — Z8249 Family history of ischemic heart disease and other diseases of the circulatory system: Secondary | ICD-10-CM

## 2021-07-27 DIAGNOSIS — I639 Cerebral infarction, unspecified: Secondary | ICD-10-CM

## 2021-07-27 DIAGNOSIS — I63233 Cerebral infarction due to unspecified occlusion or stenosis of bilateral carotid arteries: Secondary | ICD-10-CM | POA: Diagnosis not present

## 2021-07-27 DIAGNOSIS — R21 Rash and other nonspecific skin eruption: Secondary | ICD-10-CM | POA: Diagnosis present

## 2021-07-27 DIAGNOSIS — I10 Essential (primary) hypertension: Secondary | ICD-10-CM | POA: Diagnosis not present

## 2021-07-27 DIAGNOSIS — R278 Other lack of coordination: Secondary | ICD-10-CM | POA: Diagnosis present

## 2021-07-27 DIAGNOSIS — G319 Degenerative disease of nervous system, unspecified: Secondary | ICD-10-CM | POA: Diagnosis not present

## 2021-07-27 DIAGNOSIS — J90 Pleural effusion, not elsewhere classified: Secondary | ICD-10-CM | POA: Diagnosis not present

## 2021-07-27 DIAGNOSIS — I4819 Other persistent atrial fibrillation: Secondary | ICD-10-CM | POA: Diagnosis not present

## 2021-07-27 DIAGNOSIS — R404 Transient alteration of awareness: Secondary | ICD-10-CM | POA: Diagnosis not present

## 2021-07-27 DIAGNOSIS — K449 Diaphragmatic hernia without obstruction or gangrene: Secondary | ICD-10-CM | POA: Diagnosis not present

## 2021-07-27 DIAGNOSIS — I517 Cardiomegaly: Secondary | ICD-10-CM | POA: Diagnosis not present

## 2021-07-27 DIAGNOSIS — R6889 Other general symptoms and signs: Secondary | ICD-10-CM | POA: Diagnosis not present

## 2021-07-27 LAB — URINALYSIS, COMPLETE (UACMP) WITH MICROSCOPIC
Bilirubin Urine: NEGATIVE
Glucose, UA: NEGATIVE mg/dL
Ketones, ur: NEGATIVE mg/dL
Nitrite: NEGATIVE
Protein, ur: NEGATIVE mg/dL
Specific Gravity, Urine: <1.005 — ABNORMAL LOW (ref 1.005–1.030)
Squamous Epithelial / LPF: NONE SEEN (ref 0–5)
pH: 7 (ref 5.0–8.0)

## 2021-07-27 LAB — COMPREHENSIVE METABOLIC PANEL
ALT: 18 U/L (ref 0–44)
AST: 34 U/L (ref 15–41)
Albumin: 4 g/dL (ref 3.5–5.0)
Alkaline Phosphatase: 51 U/L (ref 38–126)
Anion gap: 13 (ref 5–15)
BUN: 13 mg/dL (ref 8–23)
CO2: 20 mmol/L — ABNORMAL LOW (ref 22–32)
Calcium: 9.3 mg/dL (ref 8.9–10.3)
Chloride: 102 mmol/L (ref 98–111)
Creatinine, Ser: 1.15 mg/dL — ABNORMAL HIGH (ref 0.44–1.00)
GFR, Estimated: 44 mL/min — ABNORMAL LOW (ref >60)
Glucose, Bld: 144 mg/dL — ABNORMAL HIGH (ref 70–99)
Potassium: 3.9 mmol/L (ref 3.5–5.1)
Sodium: 135 mmol/L (ref 135–145)
Total Bilirubin: 1.3 mg/dL — ABNORMAL HIGH (ref 0.3–1.2)
Total Protein: 6.7 g/dL (ref 6.5–8.1)

## 2021-07-27 LAB — CBC WITH DIFFERENTIAL/PLATELET
Abs Immature Granulocytes: 0.02 10*3/uL (ref 0.00–0.07)
Basophils Absolute: 0 10*3/uL (ref 0.0–0.1)
Basophils Relative: 1 %
Eosinophils Absolute: 0.1 10*3/uL (ref 0.0–0.5)
Eosinophils Relative: 2 %
HCT: 41.4 % (ref 36.0–46.0)
Hemoglobin: 13.7 g/dL (ref 12.0–15.0)
Immature Granulocytes: 0 %
Lymphocytes Relative: 23 %
Lymphs Abs: 1.2 10*3/uL (ref 0.7–4.0)
MCH: 32.7 pg (ref 26.0–34.0)
MCHC: 33.1 g/dL (ref 30.0–36.0)
MCV: 98.8 fL (ref 80.0–100.0)
Monocytes Absolute: 0.5 10*3/uL (ref 0.1–1.0)
Monocytes Relative: 9 %
Neutro Abs: 3.3 10*3/uL (ref 1.7–7.7)
Neutrophils Relative %: 65 %
Platelets: 208 10*3/uL (ref 150–400)
RBC: 4.19 MIL/uL (ref 3.87–5.11)
RDW: 12.6 % (ref 11.5–15.5)
WBC: 5.1 10*3/uL (ref 4.0–10.5)
nRBC: 0 % (ref 0.0–0.2)

## 2021-07-27 LAB — TROPONIN I (HIGH SENSITIVITY): Troponin I (High Sensitivity): 7 ng/L (ref <18)

## 2021-07-27 LAB — TSH: TSH: 3.306 u[IU]/mL (ref 0.350–4.500)

## 2021-07-27 LAB — PROTIME-INR
INR: 1.2 (ref 0.8–1.2)
Prothrombin Time: 14.8 seconds (ref 11.4–15.2)

## 2021-07-27 LAB — MAGNESIUM: Magnesium: 2 mg/dL (ref 1.7–2.4)

## 2021-07-27 LAB — SARS CORONAVIRUS 2 (TAT 6-24 HRS): SARS Coronavirus 2: NEGATIVE

## 2021-07-27 LAB — CBG MONITORING, ED: Glucose-Capillary: 133 mg/dL — ABNORMAL HIGH (ref 70–99)

## 2021-07-27 LAB — APTT: aPTT: 27 seconds (ref 24–36)

## 2021-07-27 MED ORDER — STROKE: EARLY STAGES OF RECOVERY BOOK: Freq: Once | Status: DC

## 2021-07-27 MED ORDER — IOHEXOL 350 MG/ML SOLN
60.0000 mL | Freq: Once | INTRAVENOUS | Status: AC | PRN
  Administered 2021-07-27: 60 mL via INTRAVENOUS

## 2021-07-27 MED ORDER — TETANUS-DIPHTH-ACELL PERTUSSIS 5-2.5-18.5 LF-MCG/0.5 IM SUSY
0.5000 mL | PREFILLED_SYRINGE | Freq: Once | INTRAMUSCULAR | Status: DC
  Filled 2021-07-27: qty 0.5

## 2021-07-27 MED ORDER — HEPARIN (PORCINE) 25000 UT/250ML-% IV SOLN
800.0000 [IU]/h | INTRAVENOUS | Status: DC
  Administered 2021-07-27: 800 [IU]/h via INTRAVENOUS
  Filled 2021-07-27: qty 250

## 2021-07-27 MED ORDER — DILTIAZEM HCL-DEXTROSE 125-5 MG/125ML-% IV SOLN (PREMIX)
5.0000 mg/h | INTRAVENOUS | Status: DC
  Administered 2021-07-27: 5 mg/h via INTRAVENOUS
  Filled 2021-07-27 (×3): qty 125

## 2021-07-27 MED ORDER — ACETAMINOPHEN 650 MG RE SUPP: 650.0000 mg | Freq: Four times a day (QID) | RECTAL | Status: DC | PRN

## 2021-07-27 MED ORDER — DILTIAZEM LOAD VIA INFUSION
20.0000 mg | Freq: Once | INTRAVENOUS | Status: AC
  Administered 2021-07-27: 20 mg via INTRAVENOUS
  Filled 2021-07-27: qty 20

## 2021-07-27 MED ORDER — ACETAMINOPHEN 325 MG PO TABS
650.0000 mg | ORAL_TABLET | Freq: Four times a day (QID) | ORAL | Status: DC | PRN
  Administered 2021-07-28 (×2): 650 mg via ORAL
  Filled 2021-07-27 (×2): qty 2

## 2021-07-27 MED ORDER — LACTATED RINGERS IV SOLN: INTRAVENOUS | Status: AC

## 2021-07-27 NOTE — ED Notes (Signed)
Patient transported to X-ray 

## 2021-07-27 NOTE — Hospital Course (Addendum)
Not feeling well this morning. Had some nausea could not eat breakfast. Felt dizzy before fall. No loc. Feels "spacy". ON midodrine had 3 pills in a year SBP<90  Multiple episodes int eh last 10 days with SBP <90. Takes midodrine.   Not sick. Malaise, fatigue.   Got up late. Been napping a lot. Been napping 2 hours. This am having breakfast went to get coffee. Been careful getting up because of bp pressure. Felt like left leg let go. Got herself to chair, called and told daughter she felt like she was going to faint. Did not LOC. Unlocked the doors, sat down and could not get back up felt like her left leg would not support her. LLE went down very slowly. No pain. Had a small scratch on her L hand Only injury. Had dizziness no SOB, no CP.    Not feeling well. 10 d patient says. 3 days ago only wanted to sleep. But she note intermittently gets palpitations.  When she's doing housework, can feel her heart rate.   10d really fatigued, no othe rsymptoms. Sleep well get up to go to bathroom.   Never felt feverish, no vomit. No abmodinal pain, take lasix in the morning and has appropriate polyuria.   HFmrEF (45 to 50% on echo in 03/19/2020.2 events sinus rhythm with PAC, sinus arrhythmia with pacs noted at that time.   Global hypokinesis. Mildly elevated PAP systolic pressure.  Aortic aneurysm. Had arrythmias in the past. Labile blood pressure. Previously on metoprolol,  30 d event monitor 4/25-5/24 2018 only a few episodes of   Last 10d doesn't feel like going outside.   Did not hit her head. No cane or roller  No coughing. Allergic reaction on ankles, itchy but betternow. Used something for dry skin.  Dizzy gets transient vision problems. Has  Trace edema in bilateral extremities.  PE:  Heart rate irregular on exam    MRI brain  IMPRESSION: Few small acute infarcts as described.  No evidence of hemorrhage.   Chronic microvascular ischemic changes. Small chronic right cerebellar  infarcts.

## 2021-07-27 NOTE — ED Notes (Signed)
Pt transported to MRI 

## 2021-07-27 NOTE — ED Provider Notes (Addendum)
[facility] EMERGENCY DEPARTMENT Provider Note   CSN: [id number] Arrival date & time: 07/27/21  1221     History Chief Complaint  Patient presents with   Near Syncope   Tachycardia    Stephanie Reyes is a 87 y.o. female.  87 y.o female with a PMH of Syncope, Orthostatic hypotension presents to the ED via EMS with a chief complaint of presyncopal episode.  Patient reports she was at home, attempting to make herself some food, when suddenly she felt like she was going to collapse on the ground.  Reports she was able to slide on the ground.  Reports she has been unable to ambulate since the incident, she managed to roll herself with a wheeled chair to reach the phone.  She feels that since this incident occurred, she has felt some numbness and weakness to the left leg has not been ambulatory since.  She did receive Cardizem 20 mg by EMS without any improvement in her heart rate.  Patient does report she has been feeling weak, fatigued, has slept a lot more in the past 10 days.  She does not have any prior history of A. fib, currently does not take any anticoagulation medication.  She is endorsing feeling so weak that she remains in bed.  She is also endorsing some shortness of breath.  Patient does take Lasix daily.  She denies any chest pain, nausea, vomiting, headaches, other complaints.  The history is provided by the patient, medical records and a relative.  Near Syncope This is a new problem. Pertinent negatives include no chest pain, no abdominal pain, no headaches and no shortness of breath.      Past Medical History:  Diagnosis Date   Essential hypertension    Premature atrial contractions    Syncope    a. 02/2017 Echo: EF 55-60%, mild LVH, no rwma, Gr1 DD, triv AI, mildly dil Ao root, mild MR, mod TR, PASP 40mmHg.    Patient Active Problem List   Diagnosis Date Noted   Orthostatic hypotension 04/05/2018   Displaced fracture of left femoral neck ([place])  02/09/2018   Left displaced femoral neck fracture ([place]) 02/07/2018   Femoral neck fracture ([place]) 02/07/2018   Ascending aortic aneurysm ([place]) 04/26/2017   Hypokalemia 02/27/2017   Benign essential HTN 02/27/2017   Renal insufficiency 02/27/2017   AF (paroxysmal atrial fibrillation) ([place]) 02/27/2017   Sinus tachycardia 02/27/2017   Syncope 02/26/2017    Past Surgical History:  Procedure Laterality Date   ANTERIOR APPROACH HEMI HIP ARTHROPLASTY Left 02/09/2018   Procedure: ANTERIOR APPROACH HEMI HIP ARTHROPLASTY;  Surgeon: Rod Can, MD;  Location: [facility];  Service: Orthopedics;  Laterality: Left;   HERNIA REPAIR     TONSILLECTOMY       OB History   No obstetric history on file.     Family History  Problem Relation Age of Onset   Hypertension Mother     Social History   Tobacco Use   Smoking status: Never   Smokeless tobacco: Never  Substance Use Topics   Alcohol use: No   Drug use: No    Home Medications Prior to Admission medications   Medication Sig Start Date End Date Taking? Authorizing Provider  furosemide (LASIX) 20 MG tablet TAKE 1 TABLET BY MOUTH EVERY DAY 03/15/21   Hilty, Nadean Corwin, MD  midodrine (PROAMATINE) 5 MG tablet Take 1 tablet (5 mg total) by mouth as needed (systolic BP under 90 or diastolic BP under 50).  11/19/19   Hilty, Nadean Corwin, MD  Multiple Vitamin (MULTIVITAMIN WITH MINERALS) TABS tablet Take 1 tablet by mouth every other day.    [provider]    Allergies    Fluvirin  [influenza virus vaccine], Aspirin, Clindamycin/lincomycin, Hydrocodone, Penicillins, Ampicillin, and Eggs or egg-derived products  Review of Systems   Review of Systems  Constitutional:  Negative for chills and fever.  Respiratory:  Negative for shortness of breath.   Cardiovascular:  Positive for near-syncope. Negative for chest pain.  Gastrointestinal:  Negative for abdominal pain, nausea and vomiting.  Musculoskeletal:  Negative for back pain.  Skin:   Negative for wound.  Neurological:  Positive for syncope and weakness. Negative for light-headedness and headaches.  All other systems reviewed and are negative.  Physical Exam Updated Vital Signs BP 137/89   Pulse 95   Temp 98.6 F (37 C) (Oral)   Resp 18   Ht _0  (1.676 m)   Wt 54 kg   SpO2 97%   BMI 19.21 kg/m   Physical Exam Vitals and nursing note reviewed.  Constitutional:      Appearance: Normal appearance. She is not ill-appearing or toxic-appearing.  HENT:     Head: Normocephalic and atraumatic.     Comments: No signs of visible signs of trauma, no palpable deformities.     Nose: Nose normal.     Mouth/Throat:     Mouth: Mucous membranes are moist.  Cardiovascular:     Rate and Rhythm: Tachycardia present. Rhythm irregular.  Pulmonary:     Effort: Pulmonary effort is normal.     Breath sounds: No wheezing.     Comments: Lungs are clear to auscultation, no wheezing or rales noted.  Abdominal:     General: Abdomen is flat.     Tenderness: There is no abdominal tenderness. There is no right CVA tenderness or left CVA tenderness.  Skin:    General: Skin is warm and dry.     Findings: Rash present. No erythema.       Neurological:     Mental Status: She is alert and oriented to person, place, and time.    ED Results / Procedures / Treatments   Labs (all labs ordered are listed, but only abnormal results are displayed) Labs Reviewed  COMPREHENSIVE METABOLIC PANEL - Abnormal; Notable for the following components:      Result Value   CO2 20 (*)    Glucose, Bld 144 (*)    Creatinine, Ser 1.15 (*)    Total Bilirubin 1.3 (*)    GFR, Estimated 44 (*)    All other components within normal limits  URINALYSIS, COMPLETE (UACMP) WITH MICROSCOPIC - Abnormal; Notable for the following components:   Specific Gravity, Urine <1.005 (*)    Hgb urine dipstick TRACE (*)    Leukocytes,Ua TRACE (*)    Bacteria, UA RARE (*)    All other components within normal limits   CBG MONITORING, ED - Abnormal; Notable for the following components:   Glucose-Capillary 133 (*)    All other components within normal limits  SARS CORONAVIRUS 2 (TAT 6-24 HRS)  CBC WITH DIFFERENTIAL/PLATELET  PROTIME-INR  APTT  MAGNESIUM  TROPONIN I (HIGH SENSITIVITY)  TROPONIN I (HIGH SENSITIVITY)    EKG EKG Interpretation  Date/Time:  Wednesday July 27 2021 12:24:21 EDT Ventricular Rate:  146 PR Interval:    QRS Duration: 89 QT Interval:  311 QTC Calculation: 485 R Axis:   -58 Text Interpretation: Atrial fibrillation with  rapid V-rate Left anterior fascicular block Probable anteroseptal infarct, old Otherwise no significant change Confirmed by Deno Etienne [phone]) on 07/27/2021 12:36:54 PM  Radiology DG Chest 2 View  Result Date: 07/27/2021 CLINICAL DATA:  Syncope EXAM: CHEST - 2 VIEW COMPARISON:  Chest x-ray dated June 06, 2006 FINDINGS: Cardiomegaly, new compared to 2007 prior. Trace left pleural effusion. Moderate hiatal hernia. Scattered bilateral linear opacities, likely due to atelectasis or scarring. No focal consolidation. No evidence of pneumothorax. IMPRESSION: Cardiomegaly, new compared to 2007 prior. Trace left pleural effusion. Scattered bilateral linear opacities, likely due to atelectasis or scarring. No focal consolidation. Moderate hiatal hernia. Electronically Signed   By: Yetta Glassman M.D.   On: 07/27/2021 13:45   DG Hip Unilat W or Wo Pelvis 2-3 Views Left  Result Date: 07/27/2021 CLINICAL DATA:  Syncope and fall with left hip pain. EXAM: DG HIP (WITH OR WITHOUT PELVIS) 2-3V LEFT COMPARISON:  02/07/2018 FINDINGS: The patient has had previous bipolar hip replacement on the left. Artifact overlies the left hemipelvis but I do not see an acute pelvic or hip region fracture. IMPRESSION: Overlying artifact. No acute fracture seen. Previous bipolar left hip replacement. Electronically Signed   By: Nelson Chimes M.D.   On: 07/27/2021 13:42     Procedures .Critical Care Performed by: Janeece Fitting, PA-C Authorized by: Janeece Fitting, PA-C   Critical care provider statement:    Critical care time (minutes):  35   Critical care start time:  07/27/2021 1:00 PM   Critical care end time:  07/27/2021 1:35 PM   Critical care time was exclusive of:  Separately billable procedures and treating other patients   Critical care was necessary to treat or prevent imminent or life-threatening deterioration of the following conditions:  Cardiac failure   Critical care was time spent personally by me on the following activities:  Blood draw for specimens, development of treatment plan with patient or surrogate, discussions with consultants, evaluation of patient's response to treatment, examination of patient, obtaining history from patient or surrogate, ordering and performing treatments and interventions, ordering and review of laboratory studies, ordering and review of radiographic studies, pulse oximetry, re-evaluation of patient's condition and review of old charts   Medications Ordered in ED Medications  diltiazem (CARDIZEM) 1 mg/mL load via infusion 20 mg (20 mg Intravenous Bolus from Bag 07/27/21 1353)    And  diltiazem (CARDIZEM) 125 mg in dextrose 5% 125 mL (1 mg/mL) infusion (5 mg/hr Intravenous New Bag/Given 07/27/21 1352)  Tdap (BOOSTRIX) injection 0.5 mL (0.5 mLs Intramuscular Patient Refused/Not Given 07/27/21 1354)    ED Course  I have reviewed the triage vital signs and the nursing notes.  Pertinent labs & imaging results that were available during my care of the patient were reviewed by me and considered in my medical decision making (see chart for details).  Clinical Course as of 07/27/21 1501  Wed Jul 27, 2021  1337 Leukocytes,Ua(!): TRACE [JS]    Clinical Course User Index [JS] Janeece Fitting, [place]   MDM Rules/Calculators/A&P    Patient with a PMH of CHF presents to the ED s/p pre syncopal episode at home. No head trauma,  no LOC, no blood thinners. Has not ambulated since this incident as the reports her left leg "is somewhat numb".  She was found to be in A. fib per EMS however reports no prior history of this.  She was given Cardizem 20 mg for rate control without much improvement.  She arrived in the ED  with a heart rate of 136.  Patient continues to report no pain or discomfort.  During my evaluation is nontoxic-appearing, no signs of hypoxia, tachypneic to the 30s, with a heart rate in the 136.  Denies any recent sick contacts, does report feeling very weak in the last 10 days, feeling like she is unable to perform any daily activity.  Of note, patient currently lives alone.  Lungs are clear to auscultation without any rhonchi or rales.  Abdomen is soft, nontender to palpation.  Visible UA on pure wick does not appeared with any gross hematuria or discoloration.  She denies any urinary symptoms on today's visit.  Moves upper and lower extremities, however left leg does appear with some decreased strength with extension.  No facial asymmetry, no dysarthria.   According to patient's records which have extensively reviewed she has a history of orthostatic hypotension, she is followed by cardiology.  Her last visit was around June 2022 where she met with her cardiologist Dr. Debara Pickett.  She recently had an echocardiogram which showed an EF of 55 to 60%, with some diastolic dysfunction and pulmonary hypertension moderately.  Also known history of aortic root dilation.  This is followed by NP [name].  It looks like prior to records patient was placed on metoprolol however she was unable to tolerate this therefore she is only taking Lasix for her CHF.  In addition she has been on midodrine 3 times a year.  After her not tolerating her metoprolol her EF was 45 to 50%.  Labs with unremarkable CBC.  UA with trace of leukocytes and rare bacteria but she denies any urinary symptoms.  PT and INR normal, patient is currently not on  anticoagulants.  CMP reveals a creatinine level of 1.1, is consistent with her previous baseline.  Electrolytes are within normal limits.  LFTs are unremarkable. CBG is 133.  Patient was placed on a Cardizem drip to help with rate control a load and infusion.  X-ray of her chest showed:  Cardiomegaly, new compared to 2007 prior. Trace left pleural  effusion.     Scattered bilateral linear opacities, likely due to atelectasis or  scarring. No focal consolidation.     Moderate hiatal hernia.      X-ray of her hip obtained to rule out any acute pathology: Overlying artifact. No acute fracture seen. Previous bipolar left  hip replacement.   Patient also have MRI brain, she is reporting some numbness to her left leg however has good motor function aside from slight decrease on the left leg with leg extension.   2:42 PM Spoke to resident family medicine service who will admit patient for further management.  Patient remains hemodynamically stable with a heart rate at 86.  A courtesy call has also been placed to cardiology, however patient is currently under medicine care.  3:01 PM spoke to cardiology who will evaluate patient while in the ED.  Portions of this note were generated with [employer]. Dictation errors may occur despite best attempts at proofreading.  Final Clinical Impression(s) / ED Diagnoses Final diagnoses:  Near syncope  Atrial fibrillation with R[facility])    Rx / DC Orders ED Discharge Orders     None        Janeece Fitting, PA-C 07/27/21 [place], DO 07/27/21 1450    Janeece Fitting, PA-C 07/27/21 [place], DO 07/28/21 0701

## 2021-07-27 NOTE — Consult Note (Addendum)
Cardiology Consultation:   Patient ID: Stephanie Reyes MRN: [id number]; DOB: [birth year]  Admit date: 07/27/2021 Date of Consult: 07/27/2021  PCP:  Glenis Smoker, MD   [facility] Cardiologist:  Pixie Casino, MD   Patient Profile:   Stephanie Reyes is a 87 y.o. female with a history of syncope, PACs, and hypertension who is being seen for the evaluation of atrial fibrillation with RVR at the request of Dr. Tyrone Nine.  History of Present Illness:   Stephanie Reyes is a 87 year old female with the above history who is followed by Dr. Debara Pickett. Patient was initially seen in 02/2017 in the hospital after a syncopal episodes. Telemetry showed frequent ectopy but no atrial fibrillation. Echo showed LVEF of 55-60% with normal wall motion and grade 1 diastolic dysfunction. Outpatient monitor showed underlying sinus rhythm with PACs but again no atrial fibrillation. Since then she has had some problems with orthostatic hypotension. She fell in 2019 and fractured her left hip. She was ultimately started on Midodrine as needed. Patient was last seen by Dr. Debara Pickett for a virtual visit in 04/2021 at which time she was doing well from a cardiac standpoint. She reported only having taken Midodrine once in the last year.   Patient presented to the ED today via EMS for further evaluation of near syncope and weakness. Patient states she has felt "super tired" for the last 10 days. It sounds like this has been a slow progression over the last 6 months but acutely worsened over the last 10 days. About 6 months, she started taking short 15-20 minute naps in the middle of the day which is very abnormal for her. Then her naps started to prolong and would last several hours. For the last 3 days, she has barely gotten out of bed because she was so tired. She felt a little better yesterday and was able to get up and walk the around her house. However, she states she just has not felt right the last 10 days. She  woke up this morning and just felt "weird." She was fixing herself breakfast and coffee when she noticed left leg weakness. Right leg was fine. She hopped over to a rolling chair and made her way to phone on this and called her daughter who then called 911. She also reports dizziness and near syncope with this but denies any syncope. She fell into something in the process (not sure what) scratching up her left arm but did not fall to the ground. She also notes some dyspnea on exertion but denies any shortness of breath at rest, orthopnea, PND, or edema. No chest pain. She notes occasional heart racing with activity and notes feeling very tired after this. No recent fevers, illness, body chills/ache.   EMS was called and she was found to be in atrial fibrillation with rates in the 100s to 150s on their arrival. She was given 2 dose of IV Cardizem with no significant improvement. Upon arrival to the ED, patient hypertensive and tachycardic. EKG showed atrial fibrillation, rate 146 bpm, with LAFB and nonspecific ST/T changes. Initial high-sensitivity troponin negative. Chest x-ray showed cardiomegaly with trace left pleural effusion and scattered bilateral linear opacities (likely atelectasis). WBC 5.1, Hgb 13.7, Plts 208. Na 135, K 3.9, Glucose 144, BUN 13, Cr 1.15. Albumin 4.0, AST 34, ALT 18, Alk Phos 51, Total Bili 1.3. Brain MRI showed a few small acute infarct but no evidence of hemorrhage. Patient is going to be admitted under  Hospitalist service but Cardiology consulted for new onset atrial fibrillation.  Past Medical History:  Diagnosis Date   Essential hypertension    Premature atrial contractions    Syncope    a. 02/2017 Echo: EF 55-60%, mild LVH, no rwma, Gr1 DD, triv AI, mildly dil Ao root, mild MR, mod TR, PASP 54mHg.    Past Surgical History:  Procedure Laterality Date   ANTERIOR APPROACH HEMI HIP ARTHROPLASTY Left 02/09/2018   Procedure: ANTERIOR APPROACH HEMI HIP ARTHROPLASTY;  Surgeon:  SRod Can MD;  [place] [facility]  Service: Orthopedics;  Laterality: Left;   HERNIA REPAIR     TONSILLECTOMY       Home Medications:  Prior to Admission medications   Medication Sig Start Date End Date Taking? Authorizing Provider  furosemide (LASIX) 20 MG tablet TAKE 1 TABLET BY MOUTH EVERY DAY Patient taking differently: Take 20 mg by mouth daily. 03/15/21  Yes Hilty, KNadean Corwin MD  midodrine (PROAMATINE) 5 MG tablet Take 1 tablet (5 mg total) by mouth as needed (systolic BP under 90 or diastolic BP under 50). 11/19/19  Yes Hilty, KNadean Corwin MD  Multiple Vitamin (MULTIVITAMIN WITH MINERALS) TABS tablet Take 1 tablet by mouth every other day.   Yes [provider]    Inpatient Medications: Scheduled Meds:   stroke: mapping our early stages of recovery book   Does not apply Once   Tdap  0.5 mL Intramuscular Once   Continuous Infusions:  diltiazem (CARDIZEM) infusion 5 mg/hr (07/27/21 1352)   heparin 800 Units/hr (07/27/21 1730)   lactated ringers     PRN Meds:   Allergies:    Allergies  Allergen Reactions   Fluvirin  [Influenza Virus Vaccine] Anaphylaxis   Aspirin Nausea And Vomiting and Swelling   Clindamycin/Lincomycin Nausea And Vomiting   Hydrocodone Nausea And Vomiting    headache   Penicillins Nausea And Vomiting and Swelling   Ampicillin Nausea And Vomiting   Eggs Or Egg-Derived Products     Egg whites Reports not being able to use albumin    Social History:   Social History   Socioeconomic History   Marital status: Divorced    Spouse name: Not on file   Number of children: Not on file   Years of education: Not on file   Highest education level: Not on file  Occupational History   Not on file  Tobacco Use   Smoking status: Never   Smokeless tobacco: Never  Substance and Sexual Activity   Alcohol use: No   Drug use: No   Sexual activity: Not on file  Other Topics Concern   Not on file  Social History Narrative   Not on file   Social  Determinants of Health   Financial Resource Strain: Not on file  Food Insecurity: Not on file  Transportation Needs: Not on file  Physical Activity: Not on file  Stress: Not on file  Social Connections: Not on file  Intimate Partner Violence: Not on file    Family History:    Family History  Problem Relation Age of Onset   Hypertension Mother      ROS:  Please see the history of present illness.  Review of Systems  Constitutional:  Positive for malaise/fatigue. Negative for chills and fever.  HENT:  Negative for congestion.   Respiratory:  Positive for shortness of breath. Negative for cough.   Cardiovascular:  Positive for palpitations. Negative for chest pain, orthopnea, leg swelling and PND.  Gastrointestinal:  Positive  for nausea. Negative for blood in stool, melena and vomiting.  Genitourinary:  Negative for hematuria.  Musculoskeletal:  Negative for falls and myalgias.  Neurological:  Positive for dizziness and weakness. Negative for loss of consciousness.  Endo/Heme/Allergies:  Does not bruise/bleed easily.  Psychiatric/Behavioral:  Positive for memory loss (some memory issues).     Physical Exam/Data:   Vitals:   07/27/21 1547 07/27/21 1600 07/27/21 1615 07/27/21 1630  BP: (!) 150/112 (!) 136/105 (!) 145/93 (!) 154/107  Pulse: 66 90 (!) 108 (!) 105  Resp: 18 20 19 17   Temp:      TempSrc:      SpO2: (!) 86% 99% 97% 97%  Weight:      Height:       No intake or output data in the 24 hours ending 07/27/21 1909 Last 3 Weights 07/27/2021 04/28/2021 01/28/2021  Weight (lbs) 119 lb 0.8 oz 119 lb 121 lb  Weight (kg) 54 kg 53.978 kg 54.885 kg     Body mass index is 19.21 kg/m.  General: 87 y.o. frail Caucasian female resting comfortably in no acute distress. HEENT: Normocephalic and atraumatic. Sclera clear.  Neck: Supple. No JVD. Heart: Mildly tachycardic with irregularly irregular rhythm. Distinct S1 and S2. No murmurs, gallops, or rubs. Radial pulses 2+ and equal  bilaterally. Lungs: No increased work of breathing. Clear to ausculation bilaterally. No wheezes, rhonchi, or rales.  Abdomen: Soft, non-distended, and non-tender to palpation. Bowel sounds present. Extremities: No lower extremity edema.    Skin: Warm and dry. Neuro: Alert and oriented x3. No focal deficits. Psych: Normal affect. Responds appropriately.  EKG:  The EKG was personally reviewed and demonstrates: Atrial fibrillation, rate 146 bpm, with LAFB but no acute ischemic changes.  Telemetry:  Telemetry was personally reviewed and demonstrates: Atrial fibrillation with rates currently in the 100s to 110s. Some PVCs/couplets noted.  Relevant CV Studies:  Event Monitor 02/28/2017 to 03/29/2017: Sinus rhythm with PAC's. Occasional sinus bradycardia. No concerning arrhythmias. _______________  Echocardiogram 03/19/2020: Impressions:  1. Left ventricular ejection fraction, by estimation, is 45 to 50%. The  left ventricle has mildly decreased function. The left ventricle  demonstrates global hypokinesis. Left ventricular diastolic parameters are  consistent with Grade I diastolic  dysfunction (impaired relaxation).   2. Right ventricular systolic function is normal. The right ventricular  size is mildly enlarged. There is mildly elevated pulmonary artery  systolic pressure. The estimated right ventricular systolic pressure is  13.2 mmHg.   3. Left atrial size was mildly dilated.   4. Right atrial size was mildly dilated.   5. The mitral valve is normal in structure. Trivial mitral valve  regurgitation.   6. Tricuspid valve regurgitation is moderate.   7. The aortic valve is tricuspid. Aortic valve regurgitation is mild to  moderate. Mild to moderate aortic valve sclerosis/calcification is  present, without any evidence of aortic stenosis.   8. Aortic dilatation noted. There is mild dilatation of the aortic root  and of the ascending aorta measuring 43 mm.   9. The inferior vena cava  is normal in size with greater than 50%  respiratory variability, suggesting right atrial pressure of 3 mmHg.   Comparison(s): Prior images unable to be directly viewed, comparison made  by report only. The left ventricular function is worsened. The rhythm  appears to be sinus rhythm with very frequent ectopy and nonsustained  atrial tachycardia, with rates in the  100-110 range.    Laboratory Data:  High Sensitivity Troponin:  Recent Labs  Lab 07/27/21 1229  TROPONINIHS 7     Chemistry Recent Labs  Lab 07/27/21 1229  NA 135  K 3.9  CL 102  CO2 20*  GLUCOSE 144*  BUN 13  CREATININE 1.15*  CALCIUM 9.3  MG 2.0  GFRNONAA 44*  ANIONGAP 13    Recent Labs  Lab 07/27/21 1229  PROT 6.7  ALBUMIN 4.0  AST 34  ALT 18  ALKPHOS 51  BILITOT 1.3*   Lipids No results for input(s): CHOL, TRIG, HDL, LABVLDL, LDLCALC, CHOLHDL in the last 168 hours.  Hematology Recent Labs  Lab 07/27/21 1229  WBC 5.1  RBC 4.19  HGB 13.7  HCT 41.4  MCV 98.8  MCH 32.7  MCHC 33.1  RDW 12.6  PLT 208   Thyroid No results for input(s): TSH, FREET4 in the last 168 hours.  BNPNo results for input(s): BNP, PROBNP in the last 168 hours.  DDimer No results for input(s): DDIMER in the last 168 hours.   Radiology/Studies:  DG Chest 2 View  Result Date: 07/27/2021 CLINICAL DATA:  Syncope EXAM: CHEST - 2 VIEW COMPARISON:  Chest x-ray dated June 06, 2006 FINDINGS: Cardiomegaly, new compared to 2007 prior. Trace left pleural effusion. Moderate hiatal hernia. Scattered bilateral linear opacities, likely due to atelectasis or scarring. No focal consolidation. No evidence of pneumothorax. IMPRESSION: Cardiomegaly, new compared to 2007 prior. Trace left pleural effusion. Scattered bilateral linear opacities, likely due to atelectasis or scarring. No focal consolidation. Moderate hiatal hernia. Electronically Signed   By: Yetta Glassman M.D.   On: 07/27/2021 13:45   MR BRAIN WO CONTRAST  Result Date:  07/27/2021 CLINICAL DATA:  Neuro deficit, acute, stroke suspected EXAM: MRI HEAD WITHOUT CONTRAST TECHNIQUE: Multiplanar, multiecho pulse sequences of the brain and surrounding structures were obtained without intravenous contrast. COMPARISON:  None. FINDINGS: Brain: Foci of reduced diffusion are present in the right corona radiata, left frontal cortex and left parietotemporal junction. No evidence of hemorrhage. There is no intracranial mass or mass effect. There is no hydrocephalus or extra-axial fluid collection. Patchy foci of T2 hyperintensity in the supratentorial white are nonspecific but may reflect mild chronic microvascular ischemic changes. Small chronic right cerebellar infarcts. Vascular: Major vessel flow voids at the skull base are preserved. Skull and upper cervical spine: Normal marrow signal is preserved. Sinuses/Orbits: Paranasal sinuses are aerated. Orbits are unremarkable. Other: Sella is unremarkable.  Mastoid air cells are clear. IMPRESSION: Few small acute infarcts as described.  No evidence of hemorrhage. Chronic microvascular ischemic changes. Small chronic right cerebellar infarcts. Electronically Signed   By: Macy Mis M.D.   On: 07/27/2021 15:50   DG Hip Unilat W or Wo Pelvis 2-3 Views Left  Result Date: 07/27/2021 CLINICAL DATA:  Syncope and fall with left hip pain. EXAM: DG HIP (WITH OR WITHOUT PELVIS) 2-3V LEFT COMPARISON:  02/07/2018 FINDINGS: The patient has had previous bipolar hip replacement on the left. Artifact overlies the left hemipelvis but I do not see an acute pelvic or hip region fracture. IMPRESSION: Overlying artifact. No acute fracture seen. Previous bipolar left hip replacement. Electronically Signed   By: Nelson Chimes M.D.   On: 07/27/2021 13:42     Assessment and Plan:   Near Syncope New Onset Atrial Fibrillation with RVR - Patient presented with fatigue, weakness, and near syncope and found to be in atrial fibrillation with RVR. Rates as high as the  150s initially. Started on IV Cardizem and rates improves to 100s to 110s  but still in atrial fibrillation.  - Potassium 3.9 and Magnesium 2.0.  - Will check TSH.  - Will check Echo.  - Continue IV Cardizem for now. Will likely focus on rate control given age. Patient does not want any invasive procedures done. Rate control may difficult due to patient's history of orthostatic hypotension (has been unable to tolerate Metoprolol in the past). If EF OK, may be able to try PO Cardizem since she is tolerated IV well. - CHA2DS2-VASc = 6 (HTN, age x2, female, and new acute stroke). Will start IV Heparin for now. Patient is frail and has a history of orthostatic hypotension. Had a discussion about recurrent stroke risk vs bleed risk. Patient would like to talk with Dr. Debara Pickett about this. He is back on service tomorrow so will continue IV Heparin for now and can then discuss DOAC tomorrow.  History of Hypertension Orthostatic Hypotension - History of hypertension but now struggles more with orthostatic hypotension. BP actually on the higher side now with most recent BP 154/107. - Continue IV Cardizem. Tolerating well.  Acute Stroke - Patient presented with left leg weakness.  - Brain MRI showed a few small acute infarcts but no hemorrhage.  - Neuro consulted and OK with IV Heparin. Further recommendations per Neuro.   Risk Assessment/Risk Scores:   CHA2DS2-VASc Score = 6  This indicates a 9.7% annual risk of stroke. The patient's score is based upon: CHF History: 0 HTN History: 1 Diabetes History: 0 Stroke History: 2 Vascular Disease History: 0 Age Score: 2 Gender Score: 1    For questions or updates, please contact [place] Please consult [email or web address] for contact info under    Signed, Darreld Mclean, PA-C  07/27/2021 7:09 PM   Patient seen and examined.  Agree with above documentation.  Stephanie Reyes is a 87 year old female with a history of hypertension who we are consulted  by Dr. Tyrone Nine for evaluation of atrial fibrillation.  She has a history of syncope and follows with Dr. Debara Pickett.  Echocardiogram in 2018 showed normal LV systolic function.  Cardiac monitor at the time showed no atrial fibrillation.  She has also had issues with orthostatic hypotension and had fall in 2019 leading to hip fracture.  She was started on as needed midodrine.  She reports that she has had weakness and worsening fatigue for the last 1 to 2 weeks.  This morning she started having weakness in her left leg and felt dizzy.  EMS was called and she was found to be in atrial fibrillation with rates up to 150s.  On presentation to the ED,  initial vital signs notable for pulse 136, BP 165/116, SPO2 98% on room air.  EKG showed atrial fibrillation, rate 146.  She was started on diltiazem drip with improvement in her heart rates.  Review of telemetry shows she was initially in A. fib with rates 150s, subsequently improved 100s to 120s.  Brain MRI showed small acute infarcts.  On exam, patient is alert and oriented, irregular, tachycardic, no murmurs, lungs CTAB, no LE edema.  For her atrial fibrillation, CHA2DS2-VASc score 6 (hypertension, age x2, stroke x2, female).  Started on heparin drip (okay per neurology given her acute infarcts).  She is undecided about long-term anticoagulation as she has had issues with falls/orthostatic hypotension.  Given this and considering her age, will need to weigh risks/benefits of anticoagulation.  She wishes to discuss with Dr. Debara Pickett before making a decision on anticoagulation.  Her rates are well controlled on  diltiazem drip at 5 mg/h.  Will check echocardiogram, if normal systolic function will plan to convert p.o. diltiazem.  If EF reduced, can switch to p.o. metoprolol.  Will need to be cautious with these medications though given her history of orthostatic hypotension.  Donato Heinz, MD

## 2021-07-27 NOTE — H&P (Signed)
Date: 07/27/2021               Patient Name:  Stephanie Reyes MRN: [id number]  DOB: Apr 10, 1924 Age / Sex: 87 y.o., female   PCP: Shon Hale, MD         Medical Service: Internal Medicine Teaching Service         Attending Physician: Dr. Inez Catalina, MD    First Contact: Dr. Sharene Butters Pager: [phone]  Second Contact: Dr. Montez Morita Pager: [phone]       After Hours (After 5p/  First Contact Pager: [phone]  weekends / holidays): Second Contact Pager: [phone]   Chief Complaint: Fall  History of Present Illness:  Ms. REMAS SOBEL is a 87 year old female with history of hypertension as well as recent hypotension, syncope, PACs, diastolic heart failure, frequent falls status post left hip fracture and repair 2019, who presented on 9/21 to [facility] ED for presyncopal episode.   Patient's daughter, Lynnell Dike, is at bedside and helps provide history.  Patient states she was at home where she lives alone, and was ambulating to her kitchen this morning when she started to feel dizzy and nauseous, and felt as if she were going to fall on the ground.  She states her left leg felt weak but she was able to gradually come down to the floor without hitting her head or loss of consciousness. Has small scratch   Patient was able to call her daughter for help who called EMS.  Patient states she has been feeling unwell for the last 10 days and has had a few similar episodes of dizziness and nausea.  She has been ambulating and eating less at home, as well as feeling very fatigued, sleeping more during the day.  She is noted low blood pressures in the last 10 days systolic less than 90. Daughter states patient becomes quickly short of breath after doing activities around the house.  During her presyncopal event, patient denied fever, shortness of breath, chest pain, palpitations, vomiting.  She denies recent visual changes, difficulty swallowing, difficulty speaking.  Patient follows with cardiologist  Dr. Rennis Golden. Originally referred after recurrent syncopal episodes.  Outpatient monitor showed frequent ectopy, PACs without atrial fibrillation.  Last ECHO 03/2020 45 to 50% with grade 1 diastolic dysfunction, moderate pulmonary hypertension, consistent with HFmrEF. Last seen by cardiology 04/2021, at that time patient was on amlodipine for hypertension and midodrine as needed for intermittent hypotension.  She did not tolerate metoprolol which worsened orthostatic symptoms.  She is also on daily furosemide.   On arrival to the ED, patient afebrile and tachycardic to 136, and hypertensive otherwise hemodynamically stable.  Patient was found to be in atrial fibrillation with rate 146bpm.  Troponin negative.  Chest x-ray showed cardiomegaly and trace left pleural effusion.  CBC unremarkable, BMP without electrolyte abnormalities bicarb 20, normal kidney function, normal liver function.  Coag panel normal. Left hip x-ray negative for acute fracture. Brain MRI showed a few small acute infarct but no evidence of hemorrhage, no midline shift, with chronic microvascular ischemic changes and small chronic right cerebellar infarcts.   Cardiology consulted for new onset atrial fibrillation.  Internal medicine teaching service called for admission.   Meds:  Current Meds  Medication Sig   furosemide (LASIX) 20 MG tablet TAKE 1 TABLET BY MOUTH EVERY DAY (Patient taking differently: Take 20 mg by mouth daily.)   midodrine (PROAMATINE) 5 MG tablet Take 1 tablet (5 mg total) by mouth as  needed (systolic BP under 90 or diastolic BP under 50).   Multiple Vitamin (MULTIVITAMIN WITH MINERALS) TABS tablet Take 1 tablet by mouth every other day.   Allergies: Allergies as of 07/27/2021 - Review Complete 07/27/2021  Allergen Reaction Noted   Fluvirin  [influenza virus vaccine] Anaphylaxis 01/22/2013   Aspirin Nausea And Vomiting and Swelling 01/22/2013   Clindamycin/lincomycin Nausea And Vomiting 01/22/2013   Hydrocodone  Nausea And Vomiting 01/22/2013   Penicillins Nausea And Vomiting and Swelling 01/22/2013   Ampicillin Nausea And Vomiting 02/26/2017   Eggs or egg-derived products  02/07/2018   Past Medical History:  Diagnosis Date   Essential hypertension    Premature atrial contractions    Syncope    a. 02/2017 Echo: EF 55-60%, mild LVH, no rwma, Gr1 DD, triv AI, mildly dil Ao root, mild MR, mod TR, PASP .    Family History:  Family History  Problem Relation Age of Onset   Hypertension Mother     Social History:  Lives home alone.  Has family that checks in on her daily and helps with tasks around the house.  She manages her own ADLs.  Family manages iADLs.  Patient does not ambulate with a walker or cane, has refused to use these in the past. Tobacco Use   Smoking status: Never   Smokeless tobacco: Never  Substance and Sexual Activity   Alcohol use: No   Drug use: No    Review of Systems: A complete ROS was negative except as per HPI.   Physical Exam: Blood pressure (!) 154/107, pulse (!) 105, temperature 98.6 F (37 C), temperature source Oral, resp. rate 17, height 5\' 6"  (1.676 m), weight 54 kg, SpO2 97 %. Physical Exam: General: Elderly Caucasian female, NAD HENT: normocephalic, atraumatic EYES: conjunctiva non-erythematous, no scleral icterus CV: irregular rate, normal rhythm, no murmurs, rubs, gallops, pectus carinatum, trace bilateral lower extremity edema Pulmonary: sating well on RA, trace crackles left lung base Abdominal: non-distended, soft, non-tender to palpation, normal BS Skin: Warm and dry, healing erythematous rash bilateral ankles (patient reports contact dermatitis) Neurological: Mental Status: Patient is awake, alert, oriented x3, fund of knowledge appropriate for age Speech and language: normal, fluent  Cranial Nerves: II: Pupils equal, round, and reactive to light III,IV, VI: EOMI all cardinal directions, without ptosis or diploplia.  V: Facial  sensation is symmetric  VII: Face appears symmetric.  VIII: Mild to moderate hearing loss bilaterally X: Uvula and palate elevate symmetrically XI: Shoulder shrug is symmetric. XII: Tongue is midline without atrophy or fasciculations.  Motor:  BUE: 5/5 BLE: 5/5 Sensory: Sensation is grossly intact bilateral UEs & LEs Deep Tendon Reflexes: 2+ L biceps patellar, hyperreflexia right biceps patellar Coordination: FTN with left upper extremity dysmetria.  Normal FTN right upper extremity.  HTS intact bilaterally. Gait: deferred  Psych: normal affect  CBC    Component Value Date/Time   WBC 5.1 07/27/2021 1229   RBC 4.19 07/27/2021 1229   HGB 13.7 07/27/2021 1229   HGB 14.0 12/24/2020 1603   HCT 41.4 07/27/2021 1229   HCT 40.7 12/24/2020 1603   PLT 208 07/27/2021 1229   PLT 210 12/24/2020 1603   MCV 98.8 07/27/2021 1229   MCV 96 12/24/2020 1603   MCH 32.7 07/27/2021 1229   MCHC 33.1 07/27/2021 1229   RDW 12.6 07/27/2021 1229   RDW 11.5 (L) 12/24/2020 1603   LYMPHSABS 1.2 07/27/2021 1229   LYMPHSABS 1.8 12/24/2020 1603   MONOABS 0.5 07/27/2021 1229  EOSABS 0.1 07/27/2021 1229   EOSABS 0.2 12/24/2020 1603   BASOSABS 0.0 07/27/2021 1229   BASOSABS 0.1 12/24/2020 1603   CMP     Component Value Date/Time   NA 135 07/27/2021 1229   NA 142 12/24/2020 1603   K 3.9 07/27/2021 1229   CL 102 07/27/2021 1229   CO2 20 (L) 07/27/2021 1229   GLUCOSE 144 (H) 07/27/2021 1229   BUN 13 07/27/2021 1229   BUN 23 12/24/2020 1603   CREATININE 1.15 (H) 07/27/2021 1229   CALCIUM 9.3 07/27/2021 1229   PROT 6.7 07/27/2021 1229   ALBUMIN 4.0 07/27/2021 1229   AST 34 07/27/2021 1229   ALT 18 07/27/2021 1229   ALKPHOS 51 07/27/2021 1229   BILITOT 1.3 (H) 07/27/2021 1229   GFRNONAA 44 (L) 07/27/2021 1229   GFRAA 48 (L) 12/24/2020 1603   Urinalysis, Complete w Microscopic Urine, Clean Catch [[id number]] (Abnormal) Collected: 07/27/21 1300  Specimen: Urine, Clean Catch Updated: 07/27/21 1320    Color, Urine YELLOW   APPearance CLEAR   Specific Gravity, Urine <1.005 Low    pH 7.0   Glucose, UA NEGATIVE mg/dL    Hgb urine dipstick TRACE Abnormal    Bilirubin Urine NEGATIVE   Ketones, ur NEGATIVE mg/dL    Protein, ur NEGATIVE mg/dL    Nitrite NEGATIVE   Leukocytes,Ua TRACE Abnormal    Squamous Epithelial / LPF NONE SEEN   WBC, UA 0-5 WBC/hpf    RBC / HPF 0-5 RBC/hpf    Bacteria, UA RARE Abnormal    APTT [[id number]] Collected: 07/27/21 1229  Specimen: Blood Updated: 07/27/21 1343   aPTT 27 seconds   Protime-INR [[id number]] Collected: 07/27/21 1229  Specimen: Blood Updated: 07/27/21 1343   Prothrombin Time 14.8 seconds    INR 1.2   Magnesium [[id number]] Collected: 07/27/21 1229   Updated: 07/27/21 1358   Magnesium 2.0 mg/dL    EKG: personally reviewed my interpretation is atrial fibrillation, left anterior fascicular block  CXR: personally reviewed my interpretation is cardiomegaly, scattered bilateral opacities, without focal consolidation, dilated aortic root/aneurysm.  Assessment & Plan by Problem: Active Problems:   A-fib [facility]) Ms. ARLENE BRICKEL is a 87 year old female with history of hypertension as well as recent hypotension, syncope, PACs, diastolic heart failure, frequent falls status post left hip fracture and repair 2019, who presented on 9/21 to [facility] ED for presyncopal episode and admitted for new diagnosis of atrial fibrillation with RVR, and found to have small acute cerebral infarcts.  #Presyncopal episode #Atrial fibrillation with RVR #Hx of PACs, recurrent presyncopal episodes  Patient presents with 10 D Hx of malaise and presyncope with acute presyncopal episode and mechanical fall on 9/21. Patient denied chest pain, palpitations.  EKG showed atrial fibrillation with HR 146 bpm.  Patient was given IV Cardizem 2 doses on route to ED without rate and rhythm control.  In the ED patient started on Cardizem drip with rate but not rhythm control.   Cardiology was consulted.  Patient follows with cardiology, Dr. Rennis Golden. ast ECHO 03/2020 45 to 50% with grade 1 diastolic dysfunction, moderate pulmonary hypertension, consistent with HFmrEF. Outpatient monitor showed PACs with sinus rhythm. CHA2DS2-VASc = 6 (HTN, age x2, female, and new acute stroke).  -Cardiology following, appreciate recommendations -Cardizem gtt -Heparin drip -Telemetry -F/U Echo -Discussion was started with patient and daughter, Lynnell Dike, about the need for long-term anticoagulation.  Discussed risk of additional strokes versus risk of bleeding.  Patient would like to follow-up with her cardiologist, Dr. Rennis Golden  with whom she follows with closely, for his opinion prior to decision concerning long-term anticoagulation.  #Acute multifocal small cerebral infarcts, likely embolic etiology #Small chronic right cerebellar infarcts Patient presented reporting left lower extremity weakness, concerning for stroke.  MRI brain showed few small acute infarcts in the right corona radiata, left frontal cortex, and left parietotemporal junction.  Also noted small chronic right cerebellar infarcts.  Etiology likely embolic in the setting of atrial fibrillation.  Neurology consulted. -Neurology consulted, appreciate recs -F/U CTA -F/U Echo -F/U TSH, LDL, Hgb A1c -Bedside swallow eval -Carotid ultrasound -Cardiac monitoring -Frequent neuro checks.  -NIHSS per protocol.  -PT/OT/SLP -Allow for permissive hypertension 24 hours, keep over 160 if possible -CTH stat for mental status change  #Mechanical fall 2/2 presyncope Patient reports controlled fall in the setting of presyncopal episode.  Without LOC or head injury.  Left hip x-ray negative for acute fracture.  Patient has small left upper extremity abrasion.  No extremity pain.  MRI brain without evidence of intracranial hemorrhage.  Reduced ambulation last 10 days, feels off balance, does not ambulate with assistive device. -PT  evaluation  #HTN #Chronic systolic heart failure Patient follows with cardiology for hypertension.  History of labile blood pressures.  Was on amlodipine 5 mg daily with midodrine as needed for intermittent hypotension.  Holding home antihypertensives for permissive hypertension in the setting of new acute infarcts. -Holding amlodipine 5 mg daily -Holding Lasix 20mg  once daily  Diet: NPO until passes swallow screen  VTE: Heparin gtt IVF: 50 mL/hr LR Code: DNR/DNI  Dispo: Admit patient to Observation with expected length of stay less than 2 midnights.  Signed: , MD 07/27/2021, 5:05 PM  Pager9/23/2022 After 5pm on weekdays and 1pm on weekends: On Call pager: [phone]

## 2021-07-27 NOTE — Progress Notes (Signed)
ANTICOAGULATION CONSULT NOTE - Initial Consult  Pharmacy Consult for heparin Indication: atrial fibrillation  Allergies  Allergen Reactions   Fluvirin  [Influenza Virus Vaccine] Anaphylaxis   Aspirin Nausea And Vomiting and Swelling   Clindamycin/Lincomycin Nausea And Vomiting   Hydrocodone Nausea And Vomiting    headache   Penicillins Nausea And Vomiting and Swelling   Ampicillin Nausea And Vomiting   Eggs Or Egg-Derived Products     Egg whites Reports not being able to use albumin    Patient Measurements: Height: 5\' 6"  (167.6 cm) Weight: 54 kg (119 lb 0.8 oz) IBW/kg (Calculated) : 59.3 Heparin Dosing Weight: 54 kg  Vital Signs: Temp: 98.6 F (37 C) (09/21 1227) Temp Source: Oral (09/21 1227) BP: 137/89 (09/21 1430) Pulse Rate: 95 (09/21 1430)  Labs: Recent Labs    07/27/21 1229  HGB 13.7  HCT 41.4  PLT 208  APTT 27  LABPROT 14.8  INR 1.2  CREATININE 1.15*  TROPONINIHS 7    Estimated Creatinine Clearance: 24.9 mL/min (A) (by C-G formula based on SCr of 1.15 mg/dL (H)).   Medical History: Past Medical History:  Diagnosis Date   Essential hypertension    Premature atrial contractions    Syncope    a. 02/2017 Echo: EF 55-60%, mild LVH, no rwma, Gr1 DD, triv AI, mildly dil Ao root, mild MR, mod TR, PASP 03/2017.    Medications:  Infusions:   diltiazem (CARDIZEM) infusion 5 mg/hr (07/27/21 1352)    Assessment: 87 yo female presents with near syncopal episode found to have atrial fibrillation with RVR not on anticoagulation PTA. Pharmacy consulted to dose heparin.  CBC stable - Hgb 13.7, plt 208  Goal of Therapy:  Heparin level 0.3-0.7 units/ml Monitor platelets by anticoagulation protocol: Yes   Plan:  No heparin bolus Start heparin infusion 800 units/hr 8 hour heparin level Daily CBC, heparin level Monitor for s/sx of bleeding  92, PharmD PGY1 Pharmacy Resident 07/27/2021  2:58 PM  Please check [email or web address] for unit-specific pharmacy  phone numbers.

## 2021-07-27 NOTE — Consult Note (Addendum)
Neurology Consultation  Reason for Consult: MRI b with stroke. Referring Physician: Sharlene Motts, MD.   CC: stroke finding on MRI brain.   History is obtained from: Patient, daughter at bedside, chart.   HPI: Stephanie Reyes is a 87 y.o. female with a PMHx of orthostatic hypotension, syncope, AF, CHF, HTN, frequent falls, and sinus tachycardia. Patient presented today secondary to a presyncopal spell at home. Patient states she was eating her breakfast thinking of cleaning up a lot of papers, and stood up and her left leg felt heavy. She felt some nausea. She did not fall, but was able to lower herself to the floor with only sustaining an abrasion on her right forearm. She pushed her chair with rollers over to where she could use the phone. Called her daughter and daughter called 911. No LOC.   On arrival to ED, patient was found to be in AF RVR with rate 146. Cardiology was consulted for AF. MRI was performed for workup of syncope and it showed a few small acute infarct without hemorrhage. Heparin and Cardizem IV drips were started.   Per patient, Dr. Rennis Golden is going to come by tomorrow and help her decide whether [facility] is a good idea at home since she has frequent falls.   Patient has no HA, n/v, vision changes, or weakness. Feels she is mentating normally (as does her daughter at bedside). She lives alone, daughter lives nearby, she can cook, bathe, dress, and toilet herself. She no longer drives.   Neurology was asked to consult for incidental finding or stroke on MRI brain.   ROS: A robust ROS was performed and is negative except as noted in the HPI.   Past Medical History:  Diagnosis Date   Essential hypertension    Premature atrial contractions    Syncope    a. 02/2017 Echo: EF 55-60%, mild LVH, no rwma, Gr1 DD, triv AI, mildly dil Ao root, mild MR, mod TR, PASP .  Sinus tachycardia.   Family History  Problem Relation Age of Onset   Hypertension Mother    Social History:   Retired, lives alone. No smoking. Drinks 1/2 of a beer every now and then. No illicit drugs.   Medications  Current Facility-Administered Medications:    acetaminophen (TYLENOL) tablet 650 mg, 650 mg, Oral, Q6H PRN **OR** acetaminophen (TYLENOL) suppository 650 mg, 650 mg, Rectal, Q6H PRN, Marolyn Haller, MD   [COMPLETED] diltiazem (CARDIZEM) 1 mg/mL load via infusion 20 mg, 20 mg, Intravenous, Once, 20 mg at 07/27/21 1353 **AND** diltiazem (CARDIZEM) 125 mg in dextrose 5% 125 mL (1 mg/mL) infusion, 5-15 mg/hr, Intravenous, Continuous, Soto, Johana, PA-C, Last Rate: 5 mL/hr at 07/27/21 1352, 5 mg/hr at 07/27/21 1352   heparin ADULT infusion 100 units/mL (25000 units/272mL), 800 Units/hr, Intravenous, Continuous, Trudee Grip, RPH, Last Rate: 8 mL/hr at 07/27/21 1730, 800 Units/hr at 07/27/21 1730   lactated ringers infusion, , Intravenous, Continuous, Marolyn Haller, MD   Tdap (BOOSTRIX) injection 0.5 mL, 0.5 mL, Intramuscular, Once, Soto, Johana, PA-C  Current Outpatient Medications:    furosemide (LASIX) 20 MG tablet, TAKE 1 TABLET BY MOUTH EVERY DAY (Patient taking differently: Take 20 mg by mouth daily.), Disp: 90 tablet, Rfl: 3   midodrine (PROAMATINE) 5 MG tablet, Take 1 tablet (5 mg total) by mouth as needed (systolic BP under 90 or diastolic BP under 50)., Disp: 30 tablet, Rfl: 5   Multiple Vitamin (MULTIVITAMIN WITH MINERALS) TABS tablet, Take 1 tablet by mouth every other  day., Disp: , Rfl:    Exam: Current vital signs: BP (!) 154/107   Pulse (!) 105   Temp 98.6 F (37 C) (Oral)   Resp 17   Ht 5\' 6"  (1.676 m)   Wt 54 kg   SpO2 97%   BMI 19.21 kg/m  Vital signs in last 24 hours: Temp:  [98.6 F (37 C)] 98.6 F (37 C) (09/21 1227) Pulse Rate:  [66-136] 105 (09/21 1630) Resp:  [17-30] 17 (09/21 1630) BP: (135-165)/(83-116) 154/107 (09/21 1630) SpO2:  [86 %-99 %] 97 % (09/21 1630) Weight:  [54 kg] 54 kg (09/21 1228)  PE: GENERAL: slightly frail, but well  appearing elderly female. Awake, alert in NAD.  HEENT: normocephalic and atraumatic. LUNGS - Normal respiratory effort. Barrell chested.  CV - RRR on tele. ABDOMEN - Soft, nontender. Ext: warm, well perfused. Petechiae to ankles/feet.  Psych: affect light. Smiling and laughing.    NEURO:  Mental Status: Alert and oriented x4. Speech/Language: speech is without dysarthria or aphasia.  Naming, repetition, fluency, and comprehension intact.  Cranial Nerves:  II: PERRL 40mm/brisk. visual fields full.  III, IV, VI: EOMI. Lid elevation symmetric and full.  V: sensation is intact and symmetrical to face.  VII: Smile is symmetrical.  VIII:very HOH IX, X: palate elevation is symmetric. Phonation normal.  XI: normal sternocleidomastoid and trapezius muscle strength. 1m is symmetrical without fasciculations.   Motor:  Strength is 4+/5 throughout.  Tone is normal. Bulk is decreased.  Sensation- Intact to light touch bilaterally in all four extremities. Extinction absent to light touch to DSS.  Coordination: FTN intact bilaterally-slightly dysmetric on left. HKS intact bilaterally. No pronator drift.  Gait- deferred.  NIHSS:  1a Level of Consciousness: 0 1b LOC Questions: 0 1c LOC Commands: 0 2 Best Gaze: 0 3 Visual: 0 4 Facial Palsy: 0 5a Motor Arm - left: 0 5b Motor Arm - Right: 0 6a Motor Leg - Left: 00 6b Motor Leg - Right:  7 Limb Ataxia: 1 8 Sensory: 0 9 Best Language: 0 10 Dysarthria: 0 11 Extinction and Inattention: 0 TOTAL: 1  Labs I have reviewed labs in epic and the results pertinent to this consultation are:  INR 1.2  aPTT 27   UA neg  CBC    Component Value Date/Time   WBC 5.1 07/27/2021 1229   RBC 4.19 07/27/2021 1229   HGB 13.7 07/27/2021 1229   HGB 14.0 12/24/2020 1603   HCT 41.4 07/27/2021 1229   HCT 40.7 12/24/2020 1603   PLT 208 07/27/2021 1229   PLT 210 12/24/2020 1603   MCV 98.8 07/27/2021 1229   MCV 96 12/24/2020 1603   MCH 32.7  07/27/2021 1229   MCHC 33.1 07/27/2021 1229   RDW 12.6 07/27/2021 1229   RDW 11.5 (L) 12/24/2020 1603   LYMPHSABS 1.2 07/27/2021 1229   LYMPHSABS 1.8 12/24/2020 1603   MONOABS 0.5 07/27/2021 1229   EOSABS 0.1 07/27/2021 1229   EOSABS 0.2 12/24/2020 1603   BASOSABS 0.0 07/27/2021 1229   BASOSABS 0.1 12/24/2020 1603    CMP     Component Value Date/Time   NA 135 07/27/2021 1229   NA 142 12/24/2020 1603   K 3.9 07/27/2021 1229   CL 102 07/27/2021 1229   CO2 20 (L) 07/27/2021 1229   GLUCOSE 144 (H) 07/27/2021 1229   BUN 13 07/27/2021 1229   BUN 23 12/24/2020 1603   CREATININE 1.15 (H) 07/27/2021 1229   CALCIUM 9.3 07/27/2021 1229  PROT 6.7 07/27/2021 1229   ALBUMIN 4.0 07/27/2021 1229   AST 34 07/27/2021 1229   ALT 18 07/27/2021 1229   ALKPHOS 51 07/27/2021 1229   BILITOT 1.3 (H) 07/27/2021 1229   GFRNONAA 44 (L) 07/27/2021 1229   GFRAA 48 (L) 12/24/2020 1603    Imaging MRI brain Few small acute infarcts as described.  No evidence of hemorrhage.  Chronic microvascular ischemic changes. Small chronic right cerebellar infarcts.  Assessment: 87 yo with stroke risk factors of AF, older age, and HTN. Patient has a history of multiple syncope/presyncope events in the past. MRI brain for workup showed small acute infarcts in right corona radiata, left frontal cortex, and left parietotemporal junction. These small infarcts are likely embolic and are the result of her AF with RVR today. NIHSS is 1  Impression: -Few small infarcts as described above. Likely embolic.  -AF with RVR.   Recommendations/Plan:  -Medicine admit.  -OK to use Heparin tonight (no bolus, stroke protocol) and will leave [facility] decision to patient and cardiology as pt wants to d/w Dr. Rennis Golden only  -[facility] stat for any change in mental status. -AF per medicine/cards.  -TSH, lipid profile, HbA1c.  -Goal A1c < 7.  -Goal LDL < 70. -Can allow permissive HTN, but this will be affected by Diltiazem drip. Try and keep  over 160 if possible.  -Stroke education.  -PT/OT/ST. -RN stroke swallow evaluation.  -NPO until passes swallow evaluation.  -Echocardiogram.  -CTA head and neck -telemetry monitoring.  -frequent neuro checks.  -NIHSS per protocol.  -Risk factor modification.  -Long term stroke prevention is Anticoagulation which she insists on discussing with Dr Rennis Golden only -Stroke team to follow.    Pt seen by Jimmye Norman, NP/Neuro and later by MD. Note/plan to be edited by MD as needed.  Pager: [phone]  Attending Neurohospitalist Addendum Patient seen and examined with APP/Resident. Agree with the history and physical as documented above. Agree with the plan as documented, which I helped formulate. I have independently reviewed the chart, obtained history, review of systems and examined the patient.I have personally reviewed pertinent head/neck/spine imaging (CT/MRI).  Last known well - unclear.Hence not a candidate for TNK. Exam not suggestive of LVO.  Please feel free to call with any questions.  -- Milon Dikes, MD Neurologist [facility] Pager: [phone]

## 2021-07-27 NOTE — ED Triage Notes (Addendum)
Pt here via EMS from home. Pt had a near syncopal episode with weakness. No LOC, no thinners. Skin tear on left arm. Heart rate 100-150. EMS gave 10mg  X2 Cardizem with no change. 152 upon arrival to ED. Pt alert and oriented X4. Only medication is Lasix.   Pt endorses SOB. 98% RA 146/100 CBG 196

## 2021-07-28 ENCOUNTER — Encounter (HOSPITAL_COMMUNITY): Payer: Self-pay | Admitting: Internal Medicine

## 2021-07-28 ENCOUNTER — Observation Stay (HOSPITAL_COMMUNITY): Payer: Medicare Other

## 2021-07-28 DIAGNOSIS — R55 Syncope and collapse: Secondary | ICD-10-CM | POA: Diagnosis present

## 2021-07-28 DIAGNOSIS — I639 Cerebral infarction, unspecified: Secondary | ICD-10-CM

## 2021-07-28 DIAGNOSIS — I63413 Cerebral infarction due to embolism of bilateral middle cerebral arteries: Secondary | ICD-10-CM

## 2021-07-28 DIAGNOSIS — W1830XA Fall on same level, unspecified, initial encounter: Secondary | ICD-10-CM | POA: Diagnosis present

## 2021-07-28 DIAGNOSIS — Z681 Body mass index (BMI) 19 or less, adult: Secondary | ICD-10-CM | POA: Diagnosis not present

## 2021-07-28 DIAGNOSIS — I5032 Chronic diastolic (congestive) heart failure: Secondary | ICD-10-CM | POA: Diagnosis present

## 2021-07-28 DIAGNOSIS — E876 Hypokalemia: Secondary | ICD-10-CM | POA: Diagnosis not present

## 2021-07-28 DIAGNOSIS — I110 Hypertensive heart disease with heart failure: Secondary | ICD-10-CM | POA: Diagnosis present

## 2021-07-28 DIAGNOSIS — Z66 Do not resuscitate: Secondary | ICD-10-CM | POA: Diagnosis present

## 2021-07-28 DIAGNOSIS — Z8249 Family history of ischemic heart disease and other diseases of the circulatory system: Secondary | ICD-10-CM | POA: Diagnosis not present

## 2021-07-28 DIAGNOSIS — I6389 Other cerebral infarction: Secondary | ICD-10-CM | POA: Diagnosis not present

## 2021-07-28 DIAGNOSIS — I4891 Unspecified atrial fibrillation: Secondary | ICD-10-CM | POA: Diagnosis not present

## 2021-07-28 DIAGNOSIS — Z888 Allergy status to other drugs, medicaments and biological substances status: Secondary | ICD-10-CM | POA: Diagnosis not present

## 2021-07-28 DIAGNOSIS — Z880 Allergy status to penicillin: Secondary | ICD-10-CM | POA: Diagnosis not present

## 2021-07-28 DIAGNOSIS — I4819 Other persistent atrial fibrillation: Secondary | ICD-10-CM | POA: Diagnosis not present

## 2021-07-28 DIAGNOSIS — Z881 Allergy status to other antibiotic agents status: Secondary | ICD-10-CM | POA: Diagnosis not present

## 2021-07-28 DIAGNOSIS — Z20822 Contact with and (suspected) exposure to covid-19: Secondary | ICD-10-CM | POA: Diagnosis present

## 2021-07-28 DIAGNOSIS — G8314 Monoplegia of lower limb affecting left nondominant side: Secondary | ICD-10-CM | POA: Diagnosis present

## 2021-07-28 DIAGNOSIS — R21 Rash and other nonspecific skin eruption: Secondary | ICD-10-CM | POA: Diagnosis present

## 2021-07-28 DIAGNOSIS — Z79899 Other long term (current) drug therapy: Secondary | ICD-10-CM | POA: Diagnosis not present

## 2021-07-28 DIAGNOSIS — S50811A Abrasion of right forearm, initial encounter: Secondary | ICD-10-CM | POA: Diagnosis present

## 2021-07-28 DIAGNOSIS — R64 Cachexia: Secondary | ICD-10-CM | POA: Diagnosis present

## 2021-07-28 DIAGNOSIS — Z886 Allergy status to analgesic agent status: Secondary | ICD-10-CM | POA: Diagnosis not present

## 2021-07-28 DIAGNOSIS — R29701 NIHSS score 1: Secondary | ICD-10-CM | POA: Diagnosis present

## 2021-07-28 DIAGNOSIS — I6340 Cerebral infarction due to embolism of unspecified cerebral artery: Secondary | ICD-10-CM | POA: Diagnosis present

## 2021-07-28 DIAGNOSIS — Z91012 Allergy to eggs: Secondary | ICD-10-CM | POA: Diagnosis not present

## 2021-07-28 DIAGNOSIS — I272 Pulmonary hypertension, unspecified: Secondary | ICD-10-CM | POA: Diagnosis present

## 2021-07-28 DIAGNOSIS — Z96642 Presence of left artificial hip joint: Secondary | ICD-10-CM | POA: Diagnosis present

## 2021-07-28 DIAGNOSIS — I444 Left anterior fascicular block: Secondary | ICD-10-CM | POA: Diagnosis present

## 2021-07-28 LAB — HEPARIN LEVEL (UNFRACTIONATED)
Heparin Unfractionated: 0.41 IU/mL (ref 0.30–0.70)
Heparin Unfractionated: 0.35 IU/mL (ref 0.30–0.70)

## 2021-07-28 LAB — ECHOCARDIOGRAM COMPLETE
AR max vel: 2.77 cm2
AV Area VTI: 2.54 cm2
AV Area mean vel: 2.64 cm2
AV Mean grad: 2 mmHg
AV Peak grad: 4.3 mmHg
Ao pk vel: 1.04 m/s
Height: 66 in
P 1/2 time: 319 msec
S' Lateral: 3.6 cm
Weight: 1904.77 oz

## 2021-07-28 LAB — CBC
HCT: 38.2 % (ref 36.0–46.0)
Hemoglobin: 13.1 g/dL (ref 12.0–15.0)
MCH: 33.2 pg (ref 26.0–34.0)
MCHC: 34.3 g/dL (ref 30.0–36.0)
MCV: 96.7 fL (ref 80.0–100.0)
Platelets: 191 10*3/uL (ref 150–400)
RBC: 3.95 MIL/uL (ref 3.87–5.11)
RDW: 12.3 % (ref 11.5–15.5)
WBC: 5.5 10*3/uL (ref 4.0–10.5)
nRBC: 0 % (ref 0.0–0.2)

## 2021-07-28 LAB — COMPREHENSIVE METABOLIC PANEL
ALT: 15 U/L (ref 0–44)
AST: 24 U/L (ref 15–41)
Albumin: 3.5 g/dL (ref 3.5–5.0)
Alkaline Phosphatase: 48 U/L (ref 38–126)
Anion gap: 11 (ref 5–15)
BUN: 10 mg/dL (ref 8–23)
CO2: 23 mmol/L (ref 22–32)
Calcium: 9.2 mg/dL (ref 8.9–10.3)
Chloride: 102 mmol/L (ref 98–111)
Creatinine, Ser: 0.93 mg/dL (ref 0.44–1.00)
GFR, Estimated: 57 mL/min — ABNORMAL LOW (ref >60)
Glucose, Bld: 97 mg/dL (ref 70–99)
Potassium: 3.1 mmol/L — ABNORMAL LOW (ref 3.5–5.1)
Sodium: 136 mmol/L (ref 135–145)
Total Bilirubin: 1.1 mg/dL (ref 0.3–1.2)
Total Protein: 6.2 g/dL — ABNORMAL LOW (ref 6.5–8.1)

## 2021-07-28 LAB — LIPID PANEL
Cholesterol: 141 mg/dL (ref 0–200)
HDL: 59 mg/dL (ref >40)
LDL Cholesterol: 73 mg/dL (ref 0–99)
Total CHOL/HDL Ratio: 2.4 RATIO
Triglycerides: 44 mg/dL (ref <150)
VLDL: 9 mg/dL (ref 0–40)

## 2021-07-28 LAB — HEMOGLOBIN A1C
Hgb A1c MFr Bld: 5.9 % — ABNORMAL HIGH (ref 4.8–5.6)
Mean Plasma Glucose: 123 mg/dL

## 2021-07-28 MED ORDER — PERFLUTREN LIPID MICROSPHERE
1.0000 mL | INTRAVENOUS | Status: AC | PRN
  Administered 2021-07-28: 2 mL via INTRAVENOUS
  Filled 2021-07-28: qty 10

## 2021-07-28 MED ORDER — APIXABAN 2.5 MG PO TABS
2.5000 mg | ORAL_TABLET | Freq: Two times a day (BID) | ORAL | Status: DC
  Administered 2021-07-28 – 2021-07-29 (×2): 2.5 mg via ORAL
  Filled 2021-07-28 (×2): qty 1

## 2021-07-28 MED ORDER — POTASSIUM CHLORIDE 10 MEQ/100ML IV SOLN
10.0000 meq | INTRAVENOUS | Status: AC
Start: 2021-07-28 — End: 2021-07-28
  Administered 2021-07-28 (×3): 10 meq via INTRAVENOUS
  Filled 2021-07-28 (×2): qty 100

## 2021-07-28 MED ORDER — AMIODARONE HCL 200 MG PO TABS
200.0000 mg | ORAL_TABLET | Freq: Two times a day (BID) | ORAL | Status: DC
  Administered 2021-07-28 – 2021-07-29 (×2): 200 mg via ORAL
  Filled 2021-07-28 (×2): qty 1

## 2021-07-28 MED ORDER — POTASSIUM CHLORIDE 10 MEQ/100ML IV SOLN
10.0000 meq | INTRAVENOUS | Status: AC
Start: 2021-07-28 — End: 2021-07-28
  Administered 2021-07-28 (×3): 10 meq via INTRAVENOUS
  Filled 2021-07-28 (×4): qty 100

## 2021-07-28 MED ORDER — ATORVASTATIN CALCIUM 10 MG PO TABS
10.0000 mg | ORAL_TABLET | Freq: Every day | ORAL | Status: DC
  Administered 2021-07-28 – 2021-07-29 (×2): 10 mg via ORAL
  Filled 2021-07-28 (×2): qty 1

## 2021-07-28 NOTE — ED Notes (Signed)
ED Provider at bedside. 

## 2021-07-28 NOTE — ED Notes (Signed)
Patient A/A/O, talking to daughter who is at bedside.

## 2021-07-28 NOTE — Progress Notes (Signed)
ANTICOAGULATION CONSULT NOTE - Initial Consult  Pharmacy Consult for heparin Indication: atrial fibrillation/CVA  Allergies  Allergen Reactions   Fluvirin  [Influenza Virus Vaccine] Anaphylaxis   Aspirin Nausea And Vomiting and Swelling   Clindamycin/Lincomycin Nausea And Vomiting   Hydrocodone Nausea And Vomiting    headache   Penicillins Nausea And Vomiting and Swelling   Ampicillin Nausea And Vomiting   Eggs Or Egg-Derived Products     Egg whites Reports not being able to use albumin    Patient Measurements: Height: 5\' 6"  (167.6 cm) Weight: 54 kg (119 lb 0.8 oz) IBW/kg (Calculated) : 59.3 Heparin Dosing Weight: 54 kg  Vital Signs: Temp: 98.4 F (36.9 C) (09/22 1200) Temp Source: Oral (09/22 1200) BP: 124/79 (09/22 1200) Pulse Rate: 78 (09/22 1200)  Labs: Recent Labs    07/27/21 1229 07/28/21 0430 07/28/21 1112  HGB 13.7 13.1  --   HCT 41.4 38.2  --   PLT 208 191  --   APTT 27  --   --   LABPROT 14.8  --   --   INR 1.2  --   --   HEPARINUNFRC  --  0.41 0.35  CREATININE 1.15* 0.93  --   TROPONINIHS 7  --   --      Estimated Creatinine Clearance: 30.8 mL/min (by C-G formula based on SCr of 0.93 mg/dL).   Medical History: Past Medical History:  Diagnosis Date   Essential hypertension    Premature atrial contractions    Syncope    a. 02/2017 Echo: EF 55-60%, mild LVH, no rwma, Gr1 DD, triv AI, mildly dil Ao root, mild MR, mod TR, PASP 03/2017.    Medications:  Infusions:   diltiazem (CARDIZEM) infusion 5 mg/hr (07/27/21 1352)   heparin 800 Units/hr (07/27/21 1730)   potassium chloride Stopped (07/28/21 1058)    Assessment: 87 yo female presents with near syncopal episode found to have atrial fibrillation with RVR not on anticoagulation PTA. Pharmacy consulted to dose heparin.  Heparin level: 0.35, therapeutic  No bleeding noted.   Goal of Therapy:  Heparin level 0.3-0.5 Monitor platelets by anticoagulation protocol: Yes   Plan:  Continue  heparin at 800 units/hr Daily CBC, heparin level Monitor for s/sx of bleeding F/u plans post cardioversion planned for 9/22  Thank you for allowing pharmacy to participate in this patient's care.  10/22, PharmD PGY1 Acute Care Resident  07/28/2021,1:34 PM

## 2021-07-28 NOTE — Progress Notes (Signed)
Subjective: No acute events or concerns overnight.  Patient passed bedside swallow and started on diet.  Patient endorses soreness at IV sites.  Denies new vision changes, swallowing difficulties, speech difficulties, weakness in her extremities.  She is reassured Dr. Rennis Golden was able to come see her in the hospital.  She has no new complaints.  Has good appetite this morning.  Objective:  Vital signs in last 24 hours: Vitals:   07/28/21 0900 07/28/21 1000 07/28/21 1200 07/28/21 1400  BP: (!) 149/63 (!) 147/129 124/79 115/82  Pulse: 85 96 78 81  Resp: 18 20 17 20   Temp:   98.4 F (36.9 C) 98.4 F (36.9 C)  TempSrc:   Oral Oral  SpO2: 97% 97% 97% 95%  Weight:      Height:       Physical Exam: General: Elderly Caucasian female, NAD HENT: normocephalic, atraumatic EYES: conjunctiva non-erythematous, no scleral icterus CV: irregular rate, normal rhythm, no murmurs, rubs, gallops, pectus carinatum, trace bilateral lower extremity edema Pulmonary: sating well on RA, trace crackles left lung base Abdominal: non-distended, soft, non-tender to palpation, normal BS Skin: Warm and dry, healing erythematous rash bilateral ankles (patient reports contact dermatitis) Neurological: Mental Status: Patient is awake, alert, oriented x3, fund of knowledge appropriate for age Speech and language: normal, fluent  Cranial Nerves: II: Pupils equal, round, and reactive to light III,IV, VI: EOMI all cardinal directions, without ptosis or diploplia.  V: Facial sensation is symmetric  VII: Face appears symmetric.  VIII: Mild to moderate hearing loss bilaterally X: Uvula and palate elevate symmetrically XI: Shoulder shrug is symmetric. XII: Tongue is midline without atrophy or fasciculations.  Motor:  BUE: 5/5 BLE: 5/5 Sensory: Sensation is grossly intact bilateral UEs & LEs Deep Tendon Reflexes: 2+ L biceps patellar, hyperreflexia right biceps patellar Coordination: FTN with left upper extremity  dysmetria.  Normal FTN right upper extremity.  HTS intact bilaterally. Gait: deferred   Psych: normal affect  Assessment/Plan:  Active Problems:   A-fib (HCC)  Stephanie Reyes is a 87 year old female with history of hypertension as well as recent hypotension, syncope, PACs, diastolic heart failure, frequent falls status post left hip fracture and repair 2019, who presented on 9/21 to [facility] ED for presyncopal episode and admitted for new diagnosis of atrial fibrillation with RVR, and found to have small acute cerebral infarcts.  #Presyncopal episode #Atrial fibrillation with RVR #Hx of PACs, recurrent presyncopal episodes Patient presented with 10 D Hx of malaise and presyncope with acute presyncopal episode and mechanical fall on 9/21. EKG showed atrial fibrillation with HR 146 bpm. CHA2DS2-VASc = 6 (HTN, age x2, female, and new acute stroke).  Rate controlled on Cardizem drip, rhythm control. -Cardiology following, appreciate recommendations -Echo 9/22: EF 55 to 60%, normal LV function, no regional wall motion abnormalities, moderately dilated LA, severe TR, mild to mod AS -Cardizem gtt, plan to switch to amiodarone and p.o. Cardizem  -Heparin drip, plan to switch to Eliquis 2.5 daily -Telemetry -Plan to keep patient on amiodarone and p.o. Cardizem for 3 weeks to see how she is tolerating and then do outpatient cardioversion   #Acute multifocal small cerebral infarcts, likely embolic etiology #Small chronic right cerebellar infarcts Patient presented reporting left lower extremity weakness, concerning for stroke.  MRI brain showed few small acute infarcts in the right corona radiata, left frontal cortex, and left parietotemporal junction.  Also noted small chronic right cerebellar infarcts.  Etiology likely embolic in the setting of atrial fibrillation.  Neurology consulted. -Neurology consulted, appreciate recs -CTA: Without LVO, atherosclerotic disease for age -TSH normal, LDL 73,  Hgb A1c pending -Passed bedside swallow, on regular diet. -Cardiac monitoring -Frequent neuro checks.  -NIHSS per protocol.  -PT/OT/SLP -CTH stat for mental status change   #Mechanical fall 2/2 presyncope Patient reports controlled fall in the setting of presyncopal episode.  Without LOC or head injury.  Left hip x-ray negative for acute fracture.  Patient has small left upper extremity abrasion.  No extremity pain.  MRI brain without evidence of intracranial hemorrhage.  Reduced ambulation last 10 days, feels off balance, does not ambulate with assistive device. -PT evaluation   #HTN #Chronic systolic heart failure Patient follows with cardiology for hypertension.  History of labile blood pressures.  Was on amlodipine 5 mg daily with midodrine as needed for intermittent hypotension.  Holding home antihypertensives for Bps 110s systolic. Will restart if BP starts to elevate. -Holding amlodipine 5 mg daily -Holding Lasix 20mg  once daily   Diet: Regular VTE: Heparin gtt IVF: None Code: DNR/DNI Prior to Admission Living Arrangement: Home Anticipated Discharge Location: Home Barriers to Discharge: Currently on Cardizem gtt Dispo: Anticipated discharge in approximately 1-2 day(s).   , MD 07/28/2021, 3:12 PM Pager: [phone] After 5pm on weekdays and 1pm on weekends: On Call pager [phone]

## 2021-07-28 NOTE — Progress Notes (Signed)
ANTICOAGULATION CONSULT NOTE - Initial Consult  Pharmacy Consult for heparin Indication: atrial fibrillation/CVA  Allergies  Allergen Reactions   Fluvirin  [Influenza Virus Vaccine] Anaphylaxis   Aspirin Nausea And Vomiting and Swelling   Clindamycin/Lincomycin Nausea And Vomiting   Hydrocodone Nausea And Vomiting    headache   Penicillins Nausea And Vomiting and Swelling   Ampicillin Nausea And Vomiting   Eggs Or Egg-Derived Products     Egg whites Reports not being able to use albumin    Patient Measurements: Height: 5\' 6"  (167.6 cm) Weight: 54 kg (119 lb 0.8 oz) IBW/kg (Calculated) : 59.3 Heparin Dosing Weight: 54 kg  Vital Signs: BP: 149/87 (09/22 0430) Pulse Rate: 84 (09/22 0430)  Labs: Recent Labs    07/27/21 1229 07/28/21 0430  HGB 13.7 13.1  HCT 41.4 38.2  PLT 208 191  APTT 27  --   LABPROT 14.8  --   INR 1.2  --   HEPARINUNFRC  --  0.41  CREATININE 1.15*  --   TROPONINIHS 7  --      Estimated Creatinine Clearance: 24.9 mL/min (A) (by C-G formula based on SCr of 1.15 mg/dL (H)).   Medical History: Past Medical History:  Diagnosis Date   Essential hypertension    Premature atrial contractions    Syncope    a. 02/2017 Echo: EF 55-60%, mild LVH, no rwma, Gr1 DD, triv AI, mildly dil Ao root, mild MR, mod TR, PASP 03/2017.    Medications:  Infusions:   diltiazem (CARDIZEM) infusion 5 mg/hr (07/27/21 1352)   heparin 800 Units/hr (07/27/21 1730)    Assessment: 87 yo female presents with near syncopal episode found to have atrial fibrillation with RVR not on anticoagulation PTA. Pharmacy consulted to dose heparin.  CBC stable - Hgb 13.7, plt 208  Heparin level came back therapeutic at 0.41 this AM. We will shoot for a modify goal due to CVA.   Goal of Therapy:  Heparin level 0.3-0.5 Monitor platelets by anticoagulation protocol: Yes   Plan:  Continue heparin at 800 units/hr Check confirm heparin level Daily CBC, heparin level Monitor for  s/sx of bleeding  92, PharmD, BCIDP, AAHIVP, CPP Infectious Disease Pharmacist 07/28/2021 5:43 AM

## 2021-07-28 NOTE — Progress Notes (Signed)
DAILY PROGRESS NOTE   Patient Name: Stephanie Reyes Date of Encounter: 07/28/2021 Cardiologist: Chrystie Nose, MD  Chief Complaint   Generalized pain  Patient Profile   Stephanie Reyes is a 87 y.o. female with a history of syncope, PACs, and hypertension who is being seen for the evaluation of atrial fibrillation with RVR at the request of Dr. Adela Lank.  Subjective   Stephanie Reyes was seen today in the emergency department.  She is well-known to me as I followed her for a number of years.  Unfortunately she has new onset atrial fibrillation.  She has felt unusually fatigued and tired over the past several weeks.  She was seen yesterday by my partner Dr. Bjorn Pippin and has been on diltiazem with good rate control.  While there has been some question of whether she could be anticoagulated long-term, I do feel that she is symptomatic with the A. fib and it is very reasonable to consider short term anticoagulation to support a cardioversion.  She has not spontaneously converted at this point.  Objective   Vitals:   07/28/21 0430 07/28/21 0500 07/28/21 0700 07/28/21 0800  BP: (!) 149/87 132/90 (!) 139/96 124/84  Pulse: 84 80 90 86  Resp: (!) 21 18 (!) 22 (!) 21  Temp:    98.5 F (36.9 C)  TempSrc:    Oral  SpO2: 96% 94% 96% 96%  Weight:      Height:        Intake/Output Summary (Last 24 hours) at 07/28/2021 1005 Last data filed at 07/28/2021 3419 Gross per 24 hour  Intake 569 ml  Output --  Net 569 ml   Filed Weights   07/27/21 1228  Weight: 54 kg    Physical Exam   General appearance: alert, cachectic, and no distress Neck: no carotid bruit, no JVD, and thyroid not enlarged, symmetric, no tenderness/mass/nodules Lungs: clear to auscultation bilaterally Heart: irregularly irregular rhythm Abdomen: soft, non-tender; bowel sounds normal; no masses,  no organomegaly Extremities: extremities normal, atraumatic, no cyanosis or edema Pulses: 2+ and symmetric Skin: Pale,  cool, dry Neurologic: Grossly normal Psych: Pleasant  Inpatient Medications    Scheduled Meds:   stroke: mapping our early stages of recovery book   Does not apply Once   Tdap  0.5 mL Intramuscular Once    Continuous Infusions:  diltiazem (CARDIZEM) infusion 5 mg/hr (07/27/21 1352)   heparin 800 Units/hr (07/27/21 1730)   potassium chloride 10 mEq (07/28/21 0952)    PRN Meds: acetaminophen **OR** acetaminophen   Labs   Results for orders placed or performed during the hospital encounter of 07/27/21 (from the past 48 hour(s))  CBC WITH DIFFERENTIAL     Status: None   Collection Time: 07/27/21 12:29 PM  Result Value Ref Range   WBC 5.1 4.0 - 10.5 K/uL   RBC 4.19 3.87 - 5.11 MIL/uL   Hemoglobin 13.7 12.0 - 15.0 g/dL   HCT 62.2 29.7 - 98.9 %   MCV 98.8 80.0 - 100.0 fL   MCH 32.7 26.0 - 34.0 pg   MCHC 33.1 30.0 - 36.0 g/dL   RDW 21.1 94.1 - 74.0 %   Platelets 208 150 - 400 K/uL   nRBC 0.0 0.0 - 0.2 %   Neutrophils Relative % 65 %   Neutro Abs 3.3 1.7 - 7.7 K/uL   Lymphocytes Relative 23 %   Lymphs Abs 1.2 0.7 - 4.0 K/uL   Monocytes Relative 9 %   Monocytes Absolute 0.5  0.1 - 1.0 K/uL   Eosinophils Relative 2 %   Eosinophils Absolute 0.1 0.0 - 0.5 K/uL   Basophils Relative 1 %   Basophils Absolute 0.0 0.0 - 0.1 K/uL   Immature Granulocytes 0 %   Abs Immature Granulocytes 0.02 0.00 - 0.07 K/uL    Comment: Performed at [facility], [address], [place], [place] [address]  Comprehensive metabolic panel     Status: Abnormal   Collection Time: 07/27/21 12:29 PM  Result Value Ref Range   Sodium 135 135 - 145 mmol/L   Potassium 3.9 3.5 - 5.1 mmol/L   Chloride 102 98 - 111 mmol/L   CO2 20 (L) 22 - 32 mmol/L   Glucose, Bld 144 (H) 70 - 99 mg/dL    Comment: Glucose reference range applies only to samples taken after fasting for at least 8 hours.   BUN 13 8 - 23 mg/dL   Creatinine, Ser 2.95 (H) 0.44 - 1.00 mg/dL   Calcium 9.3 8.9 - 62.1 mg/dL   Total Protein 6.7  6.5 - 8.1 g/dL   Albumin 4.0 3.5 - 5.0 g/dL   AST 34 15 - 41 U/L   ALT 18 0 - 44 U/L   Alkaline Phosphatase 51 38 - 126 U/L   Total Bilirubin 1.3 (H) 0.3 - 1.2 mg/dL   GFR, Estimated 44 (L) >60 mL/min    Comment: (NOTE) Calculated using the CKD-EPI Creatinine Equation (2021)    Anion gap 13 5 - 15    Comment: Performed at [facility], [address], [place], [place] [address]  Protime-INR     Status: None   Collection Time: 07/27/21 12:29 PM  Result Value Ref Range   Prothrombin Time 14.8 11.4 - 15.2 seconds   INR 1.2 0.8 - 1.2    Comment: (NOTE) INR goal varies based on device and disease states. Performed at [facility], [address], [place], [place] [address]   APTT     Status: None   Collection Time: 07/27/21 12:29 PM  Result Value Ref Range   aPTT 27 24 - 36 seconds    Comment: Performed at [facility], [address] [place], [place] [address]  Magnesium     Status: None   Collection Time: 07/27/21 12:29 PM  Result Value Ref Range   Magnesium 2.0 1.7 - 2.4 mg/dL    Comment: Performed at [facility], [address] [place], [place] [address]  Troponin I (High Sensitivity)     Status: None   Collection Time: 07/27/21 12:29 PM  Result Value Ref Range   Troponin I (High Sensitivity) 7 <18 ng/L    Comment: (NOTE) Elevated high sensitivity troponin I (hsTnI) values and significant  changes across serial measurements may suggest ACS but many other  chronic and acute conditions are known to elevate hsTnI results.  Refer to the "Links" section for chest pain algorithms and additional  guidance. Performed at [facility], [address] [place], [place] [address]   SARS CORONAVIRUS 2 (TAT 6-24 HRS) Nasopharyngeal Nasopharyngeal Swab     Status: None   Collection Time: 07/27/21 12:29 PM   Specimen: Nasopharyngeal Swab  Result Value Ref Range   SARS Coronavirus 2 NEGATIVE NEGATIVE    Comment: (NOTE) SARS-CoV-2 target nucleic acids  are NOT DETECTED.  The SARS-CoV-2 RNA is generally detectable in upper and lower respiratory specimens during the acute phase of infection. Negative results  do not preclude SARS-CoV-2 infection, do not rule out co-infections with other pathogens, and should not be used as the sole basis for treatment or other patient management decisions. Negative results must be combined with clinical observations, patient history, and epidemiological information. The expected result is Negative.  Fact Sheet for Patients: HairSlick.no  Fact Sheet for Healthcare Providers: quierodirigir.com  This test is not yet approved or cleared by the [place] FDA and  has been authorized for detection and/or diagnosis of SARS-CoV-2 by FDA under an Emergency Use Authorization (EUA). This EUA will remain  in effect (meaning this test can be used) for the duration of the COVID-19 declaration under Se ction 564(b)(1) of the Act, 21 U.S.C. section 360bbb-3(b)(1), unless the authorization is terminated or revoked sooner.  Performed at [facility], [address], [place], [place] [address]   CBG monitoring, ED     Status: Abnormal   Collection Time: 07/27/21 12:32 PM  Result Value Ref Range   Glucose-Capillary 133 (H) 70 - 99 mg/dL    Comment: Glucose reference range applies only to samples taken after fasting for at least 8 hours.  Urinalysis, Complete w Microscopic Urine, Clean Catch     Status: Abnormal   Collection Time: 07/27/21  1:00 PM  Result Value Ref Range   Color, Urine YELLOW YELLOW   APPearance CLEAR CLEAR   Specific Gravity, Urine <1.005 (L) 1.005 - 1.030   pH 7.0 5.0 - 8.0   Glucose, UA NEGATIVE NEGATIVE mg/dL   Hgb urine dipstick TRACE (A) NEGATIVE   Bilirubin Urine NEGATIVE NEGATIVE   Ketones, ur NEGATIVE NEGATIVE mg/dL   Protein, ur NEGATIVE NEGATIVE mg/dL   Nitrite NEGATIVE NEGATIVE   Leukocytes,Ua TRACE (A) NEGATIVE    Squamous Epithelial / LPF NONE SEEN 0 - 5   WBC, UA 0-5 0 - 5 WBC/hpf   RBC / HPF 0-5 0 - 5 RBC/hpf   Bacteria, UA RARE (A) NONE SEEN    Comment: Performed at [facility], [address], [place], [place] [address]  TSH     Status: None   Collection Time: 07/27/21  4:40 PM  Result Value Ref Range   TSH 3.306 0.350 - 4.500 uIU/mL    Comment: Performed by a 3rd Generation assay with a functional sensitivity of <=0.01 uIU/mL. Performed at [facility], [address] [place], [place] [address]   Heparin level (unfractionated)     Status: None   Collection Time: 07/28/21  4:30 AM  Result Value Ref Range   Heparin Unfractionated 0.41 0.30 - 0.70 IU/mL    Comment: (NOTE) The clinical reportable range upper limit is being lowered to >1.10 to align with the FDA approved guidance for the current laboratory assay.  If heparin results are below expected values, and patient dosage has  been confirmed, suggest follow up testing of antithrombin III levels. Performed at [facility], [address] [place], [place] [address]   Comprehensive metabolic panel     Status: Abnormal   Collection Time: 07/28/21  4:30 AM  Result Value Ref Range   Sodium 136 135 - 145 mmol/L   Potassium 3.1 (L) 3.5 - 5.1 mmol/L   Chloride 102 98 - 111 mmol/L   CO2 23 22 - 32 mmol/L   Glucose, Bld 97 70 - 99 mg/dL    Comment: Glucose reference range applies only to samples taken after fasting for at least 8 hours.   BUN 10 8 -  23 mg/dL   Creatinine, Ser 0.09 0.44 - 1.00 mg/dL   Calcium 9.2 8.9 - 38.1 mg/dL   Total Protein 6.2 (L) 6.5 - 8.1 g/dL   Albumin 3.5 3.5 - 5.0 g/dL   AST 24 15 - 41 U/L   ALT 15 0 - 44 U/L   Alkaline Phosphatase 48 38 - 126 U/L   Total Bilirubin 1.1 0.3 - 1.2 mg/dL   GFR, Estimated 57 (L) >60 mL/min    Comment: (NOTE) Calculated using the CKD-EPI Creatinine Equation (2021)    Anion gap 11 5 - 15    Comment: Performed at [facility], [address]  [place], [place] [address]  CBC     Status: None   Collection Time: 07/28/21  4:30 AM  Result Value Ref Range   WBC 5.5 4.0 - 10.5 K/uL   RBC 3.95 3.87 - 5.11 MIL/uL   Hemoglobin 13.1 12.0 - 15.0 g/dL   HCT 71.6 96.7 - 89.3 %   MCV 96.7 80.0 - 100.0 fL   MCH 33.2 26.0 - 34.0 pg   MCHC 34.3 30.0 - 36.0 g/dL   RDW 81.0 17.5 - 10.2 %   Platelets 191 150 - 400 K/uL   nRBC 0.0 0.0 - 0.2 %    Comment: Performed at [facility], [address], [place], [place] [address]  Lipid panel     Status: None   Collection Time: 07/28/21  4:30 AM  Result Value Ref Range   Cholesterol 141 0 - 200 mg/dL   Triglycerides 44 <782 mg/dL   HDL 59 >42 mg/dL   Total CHOL/HDL Ratio 2.4 RATIO   VLDL 9 0 - 40 mg/dL   LDL Cholesterol 73 0 - 99 mg/dL    Comment:        Total Cholesterol/HDL:CHD Risk Coronary Heart Disease Risk Table                     Men   Women  1/2 Average Risk   3.4   3.3  Average Risk       5.0   4.4  2 X Average Risk   9.6   7.1  3 X Average Risk  23.4   11.0        Use the calculated Patient Ratio above and the CHD Risk Table to determine the patient's CHD Risk.        ATP III CLASSIFICATION (LDL):  <100     mg/dL   Optimal  353-614  mg/dL   Near or Above                    Optimal  130-159  mg/dL   Borderline  431-540  mg/dL   High  >086     mg/dL   Very High Performed at [facility], [address], [place], [place] [address]     ECG   N/A  Telemetry   A. fib with controlled ventricular response- Personally Reviewed  Radiology    CT ANGIO HEAD NECK W WO CM  Result Date: 07/27/2021 CLINICAL DATA:  Follow-up examination for stroke. EXAM: CT ANGIOGRAPHY HEAD AND NECK TECHNIQUE: Multidetector CT imaging of the head and neck was performed using the standard protocol during bolus administration of intravenous contrast. Multiplanar CT image reconstructions and MIPs were obtained to evaluate the vascular anatomy. Carotid stenosis measurements (when applicable)  are obtained utilizing NASCET criteria, using the distal internal carotid  diameter as the denominator. CONTRAST:  55mL OMNIPAQUE IOHEXOL 350 MG/ML SOLN COMPARISON:  MRI from earlier the same day. FINDINGS: CT HEAD FINDINGS Brain: Age-related cerebral atrophy with mild chronic small vessel ischemic disease. Previously identified small ischemic infarcts not well visualized by CT. No other acute large vessel territory infarct. No intracranial hemorrhage. No mass lesion or midline shift. No hydrocephalus or extra-axial fluid collection. Vascular: No hyperdense vessel. Skull: Scalp soft tissues and calvarium within normal limits. Sinuses: Paranasal sinuses and mastoid air cells are largely clear. Orbits: Globes and orbital soft tissues demonstrate no acute finding. Senescent calcifications noted. Review of the MIP images confirms the above findings CTA NECK FINDINGS Aortic arch: Visualized aortic arch normal in caliber with normal branch pattern. Mild-to-moderate atheromatous change about the arch. No visible stenosis about the origin of the great vessels. Right carotid system: Right common and internal carotid arteries tortuous and partially medialized into the retropharyngeal space, but are widely patent without stenosis, dissection or occlusion. Left carotid system: Left common and internal carotid arteries tortuous but are widely patent without stenosis, dissection or occlusion. Vertebral arteries: Both vertebral arteries arise from the subclavian arteries. No significant proximal subclavian artery stenosis. Strongly dominant right vertebral artery with hypoplastic left vertebral artery. Vertebral arteries patent without stenosis, dissection or occlusion. Skeleton: Osteopenia noted. No discrete or worrisome osseous lesions. Multilevel facet arthrosis noted throughout the cervical spine. Spondylosis at C6-7 with mild-to-moderate spinal stenosis. Patient is edentulous. Other neck: No other acute soft tissue abnormality  within the neck. No mass or adenopathy. Upper chest: Pleuroparenchymal scarring noted at the left lung apex. Visualized upper chest demonstrates no other acute finding. Review of the MIP images confirms the above findings CTA HEAD FINDINGS Anterior circulation: Petrous segments patent bilaterally. Mild for age atheromatous change within the carotid siphons without significant stenosis. A1 segments patent bilaterally. Left A1 hypoplastic, accounting for the diminutive left ICA is compared to the right. Normal anterior communicating artery complex. Anterior cerebral arteries patent to their distal aspects without stenosis. No M1 stenosis or occlusion. Normal MCA bifurcations. Distal MCA branches well perfused and symmetric. Posterior circulation: Dominant right vertebral artery widely patent to the vertebrobasilar junction. Right PICA patent at its origin. Hypoplastic left vertebral artery largely terminates in PICA. Left PICA patent as well. Basilar widely patent to its distal aspect without stenosis. Superior cerebellar arteries patent bilaterally. Both PCAs primarily supplied via the basilar well perfused to their distal aspects. Venous sinuses: Grossly patent allowing for timing the contrast bolus. Anatomic variants: Hypoplastic left A1 segment. Hypoplastic left vertebral artery terminates in PICA. No aneurysm. Review of the MIP images confirms the above findings IMPRESSION: 1. Negative CTA for large vessel occlusion. 2. Mild for age atheromatous change about the major arterial vasculature of the head and neck as above. No hemodynamically significant or correctable stenosis. 3. Diffuse tortuosity of the major arterial vasculature of the head and neck, suggesting chronic underlying hypertension. Electronically Signed   By: Rise Mu M.D.   On: 07/27/2021 21:30   DG Chest 2 View  Result Date: 07/27/2021 CLINICAL DATA:  Syncope EXAM: CHEST - 2 VIEW COMPARISON:  Chest x-ray dated June 06, 2006  FINDINGS: Cardiomegaly, new compared to 2007 prior. Trace left pleural effusion. Moderate hiatal hernia. Scattered bilateral linear opacities, likely due to atelectasis or scarring. No focal consolidation. No evidence of pneumothorax. IMPRESSION: Cardiomegaly, new compared to 2007 prior. Trace left pleural effusion. Scattered bilateral linear opacities, likely due to atelectasis or scarring. No focal consolidation. Moderate  hiatal hernia. Electronically Signed   By: Allegra Lai M.D.   On: 07/27/2021 13:45   MR BRAIN WO CONTRAST  Result Date: 07/27/2021 CLINICAL DATA:  Neuro deficit, acute, stroke suspected EXAM: MRI HEAD WITHOUT CONTRAST TECHNIQUE: Multiplanar, multiecho pulse sequences of the brain and surrounding structures were obtained without intravenous contrast. COMPARISON:  None. FINDINGS: Brain: Foci of reduced diffusion are present in the right corona radiata, left frontal cortex and left parietotemporal junction. No evidence of hemorrhage. There is no intracranial mass or mass effect. There is no hydrocephalus or extra-axial fluid collection. Patchy foci of T2 hyperintensity in the supratentorial white are nonspecific but may reflect mild chronic microvascular ischemic changes. Small chronic right cerebellar infarcts. Vascular: Major vessel flow voids at the skull base are preserved. Skull and upper cervical spine: Normal marrow signal is preserved. Sinuses/Orbits: Paranasal sinuses are aerated. Orbits are unremarkable. Other: Sella is unremarkable.  Mastoid air cells are clear. IMPRESSION: Few small acute infarcts as described.  No evidence of hemorrhage. Chronic microvascular ischemic changes. Small chronic right cerebellar infarcts. Electronically Signed   By: Guadlupe Spanish M.D.   On: 07/27/2021 15:50   DG Hip Unilat W or Wo Pelvis 2-3 Views Left  Result Date: 07/27/2021 CLINICAL DATA:  Syncope and fall with left hip pain. EXAM: DG HIP (WITH OR WITHOUT PELVIS) 2-3V LEFT COMPARISON:   02/07/2018 FINDINGS: The patient has had previous bipolar hip replacement on the left. Artifact overlies the left hemipelvis but I do not see an acute pelvic or hip region fracture. IMPRESSION: Overlying artifact. No acute fracture seen. Previous bipolar left hip replacement. Electronically Signed   By: Paulina Fusi M.D.   On: 07/27/2021 13:42    Cardiac Studies   Echo pending  Assessment   Active Problems:   A-fib [facility])   Plan   Stephanie Reyes has new onset atrial fibrillation with now controlled ventricular response on diltiazem.  An echocardiogram is pending.  I have discussed with her the possibility of proceeding with a TEE and cardioversion however unfortunately the schedule is full today and tomorrow, so it may not be likely that we can get to it in the short-term.  Otherwise we may need to consider anticoagulation and outpatient TEE cardioversion next week.  She was seen by neurology for possible stroke and MRI does show few small acute infarcts as well as chronic microvascular changes.  Based on these findings, provided her echo does not show significant systolic heart failure, would recommend we anticoagulate her with Eliquis 2.5 mg daily and consider outpatient cardioversion after 3 weeks uninterrupted anticoagulation.  She does report some generalized musculoskeletal pain which would be okay to give Tylenol for.  Time Spent Directly with Patient:  I have spent a total of 25 minutes with the patient reviewing hospital notes, telemetry, EKGs, labs and examining the patient as well as establishing an assessment and plan that was discussed personally with the patient.  > 50% of time was spent in direct patient care.  Length of Stay:  LOS: 0 days   Chrystie Nose, MD, [facility], FACP  [place]  [facility]  Medical Director of the Advanced Lipid Disorders &  Cardiovascular Risk Reduction Clinic Diplomate of the American Board of Clinical Lipidology Attending Cardiologist  Direct  Dial: [phone]  Fax: [phone]  Website:  [email or web address] 07/28/2021, 10:05 AM

## 2021-07-28 NOTE — ED Notes (Signed)
Provider at bedside

## 2021-07-28 NOTE — Progress Notes (Deleted)
ANTICOAGULATION CONSULT NOTE - Follow-up Consult  Pharmacy Consult for heparin Indication: atrial fibrillation/CVA  Allergies  Allergen Reactions   Fluvirin  [Influenza Virus Vaccine] Anaphylaxis   Aspirin Nausea And Vomiting and Swelling   Clindamycin/Lincomycin Nausea And Vomiting   Hydrocodone Nausea And Vomiting    headache   Penicillins Nausea And Vomiting and Swelling   Ampicillin Nausea And Vomiting   Eggs Or Egg-Derived Products     Egg whites Reports not being able to use albumin    Patient Measurements: Height: 5\' 6"  (167.6 cm) Weight: 54 kg (119 lb 0.8 oz) IBW/kg (Calculated) : 59.3 Heparin Dosing Weight: 54 kg  Vital Signs: Temp: 98.4 F (36.9 C) (09/22 1200) Temp Source: Oral (09/22 1200) BP: 124/79 (09/22 1200) Pulse Rate: 78 (09/22 1200)  Labs: Recent Labs    07/27/21 1229 07/28/21 0430 07/28/21 1112  HGB 13.7 13.1  --   HCT 41.4 38.2  --   PLT 208 191  --   APTT 27  --   --   LABPROT 14.8  --   --   INR 1.2  --   --   HEPARINUNFRC  --  0.41 0.35  CREATININE 1.15* 0.93  --   TROPONINIHS 7  --   --      Estimated Creatinine Clearance: 30.8 mL/min (by C-G formula based on SCr of 0.93 mg/dL).   Medical History: Past Medical History:  Diagnosis Date   Essential hypertension    Premature atrial contractions    Syncope    a. 02/2017 Echo: EF 55-60%, mild LVH, no rwma, Gr1 DD, triv AI, mildly dil Ao root, mild MR, mod TR, PASP 03/2017.    Medications:  Infusions:   diltiazem (CARDIZEM) infusion 5 mg/hr (07/27/21 1352)   heparin 800 Units/hr (07/27/21 1730)   potassium chloride Stopped (07/28/21 1058)    Assessment: 87 yo female presents with near syncopal episode found to have atrial fibrillation with RVR not on anticoagulation PTA. Pharmacy consulted to dose heparin.  Heparin level is therapeutic at 0.35 with new goal of 0.3-0.5 due to CVA. CBC stable. No bleeding or infusion problems noted.   Goal of Therapy:  Heparin level  0.3-0.5 Monitor platelets by anticoagulation protocol: Yes   Plan:  Continue heparin at 800 units/hr Daily CBC, heparin level Monitor for s/sx of bleeding  92, PharmD PGY1 Pharmacy Resident 07/28/2021  1:37 PM  Please check [email or web address] for unit-specific pharmacy phone numbers.

## 2021-07-28 NOTE — Progress Notes (Signed)
   I personally reviewed Stephanie Reyes's echocardiogram.  This x-ray shows normal systolic function however she does have moderate biatrial enlargement and severe TR.  This will complicate her ability to achieve sinus rhythm.  I do think however it is worthwhile to try to achieve this that she has felt more fatigued and short of breath since she has been in A. fib.  Her rate is generally well controlled.  Blood pressure is normal although she struggled with some hypotension in the past.  She actually has been on midodrine, but has not required it here.  Unfortunately, there are no options for TEE/cardioversion in the next day or 2, therefore it would make sense to start her on antiarrhythmic therapy.  Would recommend starting amiodarone 200 mg twice daily given her low weight and Eliquis 2.5 mg twice daily (weight less than 60 kg, age greater than 80).  Would overlap diltiazem this evening and consider discontinuing it tomorrow or if she has some bradycardia after starting amiodarone.  Blood pressure may not allow oral diltiazem at home.  Will follow-up on rounds in the morning.  Chrystie Nose, MD, [facility], FACP  [place]  [facility]  Medical Director of the Advanced Lipid Disorders &  Cardiovascular Risk Reduction Clinic Diplomate of the American Board of Clinical Lipidology Attending Cardiologist  Direct Dial: [phone]  Fax: [phone]  Website:  [email or web address]

## 2021-07-28 NOTE — ED Notes (Signed)
Patient transported to ECHO.

## 2021-07-28 NOTE — Progress Notes (Signed)
  Date: 07/28/2021  Patient name: Stephanie Reyes  Medical record number: [id number]  Date of birth: 18-Sep-1924   I have seen and evaluated Stephanie Reyes and discussed their care with the Residency Team. Briefly, Stephanie Reyes is a 87 year old woman with PMH of HTN/Hypotension, syncope, PACs, diastolic heart failure who presented with pre-syncope.  Found to have Afib with RVR and embolic events on CT head likely CVAs.  Dr. Rennis Golden has seen the patient and she has been evaluated by Neurology.  She notes feeling bad this morning due to all the IV sticks she has had.  Otherwise, she is aware of cardiology plan.  She remains on diltiazem drip and heparin drip.   Vitals:   07/28/21 0900 07/28/21 1000  BP: (!) 149/63 (!) 147/129  Pulse: 85 96  Resp: 18 20  Temp:    SpO2: 97% 97%   Gen: Elderly woman, lying in bed, hard of hearing Eyes: Anicteric sclerae HENT: Neck supple, MMM CV: Irreg Irreg, normal rate, no pedal edema Pulm: Trace crackles at bases, no wheezing Abd; Soft, +BS MSK: normal tone and bulk Skin: Bruising at site of needle sticks for blood work.   Assessment and Plan: I have seen and evaluated the patient as outlined above. I agree with the formulated Assessment and Plan as detailed in the residents' note, with the following changes:   1. Presyncope/Afib with RVR - Cardiology, Dr. Rennis Golden, following - On cardizem drip, will attempt to transition to PO meds today - TTE pending - Heparin Drip --> transition to PO eliquis today - telemetry  2. Acute multifocal CVA, likely embolic - Heparin gtt - CTA head/neck - PT/OT evaluation - Permissive HTN - Follow up neurology recommendations  Other issues per Dr. Ulyess Reyes daily note.   Stephanie Catalina, MD 9/22/202211:46 AM

## 2021-07-28 NOTE — Progress Notes (Signed)
  Echocardiogram 2D Echocardiogram with contrast has been performed.  Roosvelt Maser F 07/28/2021, 1:53 PM

## 2021-07-28 NOTE — Progress Notes (Signed)
PT Cancellation Note  Patient Details Name: Stephanie Reyes MRN: [id number] DOB: 03/16/1924   Cancelled Treatment:    Reason Eval/Treat Not Completed: Fatigue/lethargy limiting ability to participate Pt reports she is too tired to work with PT and requesting to hold. Will follow up as schedule allows.   Cindee Salt, DPT  Acute Rehabilitation Services  Pager: [phone] Office: [phone]    [name] 07/28/2021, 11:25 AM

## 2021-07-28 NOTE — Progress Notes (Addendum)
STROKE TEAM PROGRESS NOTE   ATTENDING NOTE: I reviewed above note and agree with the assessment and plan. Pt was seen and examined.   87 year old female with history of orthostatic hypotension on midodrine, syncope, A. fib not on AC, CHF, hypertension, SVT, falls admitted for presyncope and left leg heaviness.  Found to be in A. fib RVR.  Currently rate controlled.  MRI showed left parietal and right corona radiata small infarcts.  CTA head and neck unremarkable.  EF 55 to 60%.  LDL 73, A1c pending.  Creatinine 1.15.  Patient currently on heparin and Cardizem IV.    On exam, daughter at bedside, patient awake alert, orientated to place, people, months but stated year of 2021 instead of 2022.  No aphasia, fluent language, no dysarthria, follows simple commands, able to name and repeat.  No gaze palsy, visual field fall, facial symmetrical.  Bilateral upper and lower extremity equal strengths, sensation symmetrical.  Finger to nose slow but grossly intact.  Etiology for patient embolic stroke likely due to A. fib not on AC.  Cardiology Dr. Rennis Golden on board, put a patient on amiodarone and also Eliquis 2.5 mg twice daily.  We agree with starting Eliquis given small size of infarct.  Started low-dose statin given advanced age and LDL near goal.  Further A. fib management per cardiology.  PT/OT.  For detailed assessment and plan, please refer to above as I have made changes wherever appropriate.   Neurology will sign off. Please call with questions. Pt will follow up with stroke clinic NP at [facility] in about 4 weeks. Thanks for the consult.  Marvel Plan, MD PhD Stroke Neurology 07/28/2021 6:06 PM    INTERVAL HISTORY  87 y.o. female with a PMHx of orthostatic hypotension, syncope, AF, CHF, HTN, frequent falls, and sinus tachycardia. Patient presented today secondary to a presyncopal spell at home. On arrival to ED, patient was found to be in AF RVR with rate 146. Cardiology was consulted for AF. MRI was  performed for workup of syncope and it showed a few small acute infarct without hemorrhage. Heparin and Cardizem IV drips were started.    Her daughter is at the bedside.     Vitals:   07/28/21 0900 07/28/21 1000 07/28/21 1200 07/28/21 1400  BP: (!) 149/63 (!) 147/129 124/79 115/82  Pulse: 85 96 78 81  Resp: 18 20 17 20   Temp:   98.4 F (36.9 C) 98.4 F (36.9 C)  TempSrc:   Oral Oral  SpO2: 97% 97% 97% 95%  Weight:      Height:       CBC:  Recent Labs  Lab 07/27/21 1229 07/28/21 0430  WBC 5.1 5.5  NEUTROABS 3.3  --   HGB 13.7 13.1  HCT 41.4 38.2  MCV 98.8 96.7  PLT 208 191   Basic Metabolic Panel:  Recent Labs  Lab 07/27/21 1229 07/28/21 0430  NA 135 136  K 3.9 3.1*  CL 102 102  CO2 20* 23  GLUCOSE 144* 97  BUN 13 10  CREATININE 1.15* 0.93  CALCIUM 9.3 9.2  MG 2.0  --     Lipid Panel:  Recent Labs  Lab 07/28/21 0430  CHOL 141  TRIG 44  HDL 59  CHOLHDL 2.4  VLDL 9  LDLCALC 73    HgbA1c: No results for input(s): HGBA1C in the last 168 hours. Urine Drug Screen: No results for input(s): LABOPIA, COCAINSCRNUR, LABBENZ, AMPHETMU, THCU, LABBARB in the last 168 hours.  Alcohol Level  No results for input(s): ETH in the last 168 hours.  IMAGING past 24 hours CT ANGIO HEAD NECK W WO CM  Result Date: 07/27/2021 CLINICAL DATA:  Follow-up examination for stroke. EXAM: CT ANGIOGRAPHY HEAD AND NECK TECHNIQUE: Multidetector CT imaging of the head and neck was performed using the standard protocol during bolus administration of intravenous contrast. Multiplanar CT image reconstructions and MIPs were obtained to evaluate the vascular anatomy. Carotid stenosis measurements (when applicable) are obtained utilizing NASCET criteria, using the distal internal carotid diameter as the denominator. CONTRAST:  63mL OMNIPAQUE IOHEXOL 350 MG/ML SOLN COMPARISON:  MRI from earlier the same day. FINDINGS: CT HEAD FINDINGS Brain: Age-related cerebral atrophy with mild chronic small vessel  ischemic disease. Previously identified small ischemic infarcts not well visualized by CT. No other acute large vessel territory infarct. No intracranial hemorrhage. No mass lesion or midline shift. No hydrocephalus or extra-axial fluid collection. Vascular: No hyperdense vessel. Skull: Scalp soft tissues and calvarium within normal limits. Sinuses: Paranasal sinuses and mastoid air cells are largely clear. Orbits: Globes and orbital soft tissues demonstrate no acute finding. Senescent calcifications noted. Review of the MIP images confirms the above findings CTA NECK FINDINGS Aortic arch: Visualized aortic arch normal in caliber with normal branch pattern. Mild-to-moderate atheromatous change about the arch. No visible stenosis about the origin of the great vessels. Right carotid system: Right common and internal carotid arteries tortuous and partially medialized into the retropharyngeal space, but are widely patent without stenosis, dissection or occlusion. Left carotid system: Left common and internal carotid arteries tortuous but are widely patent without stenosis, dissection or occlusion. Vertebral arteries: Both vertebral arteries arise from the subclavian arteries. No significant proximal subclavian artery stenosis. Strongly dominant right vertebral artery with hypoplastic left vertebral artery. Vertebral arteries patent without stenosis, dissection or occlusion. Skeleton: Osteopenia noted. No discrete or worrisome osseous lesions. Multilevel facet arthrosis noted throughout the cervical spine. Spondylosis at C6-7 with mild-to-moderate spinal stenosis. Patient is edentulous. Other neck: No other acute soft tissue abnormality within the neck. No mass or adenopathy. Upper chest: Pleuroparenchymal scarring noted at the left lung apex. Visualized upper chest demonstrates no other acute finding. Review of the MIP images confirms the above findings CTA HEAD FINDINGS Anterior circulation: Petrous segments patent  bilaterally. Mild for age atheromatous change within the carotid siphons without significant stenosis. A1 segments patent bilaterally. Left A1 hypoplastic, accounting for the diminutive left ICA is compared to the right. Normal anterior communicating artery complex. Anterior cerebral arteries patent to their distal aspects without stenosis. No M1 stenosis or occlusion. Normal MCA bifurcations. Distal MCA branches well perfused and symmetric. Posterior circulation: Dominant right vertebral artery widely patent to the vertebrobasilar junction. Right PICA patent at its origin. Hypoplastic left vertebral artery largely terminates in PICA. Left PICA patent as well. Basilar widely patent to its distal aspect without stenosis. Superior cerebellar arteries patent bilaterally. Both PCAs primarily supplied via the basilar well perfused to their distal aspects. Venous sinuses: Grossly patent allowing for timing the contrast bolus. Anatomic variants: Hypoplastic left A1 segment. Hypoplastic left vertebral artery terminates in PICA. No aneurysm. Review of the MIP images confirms the above findings IMPRESSION: 1. Negative CTA for large vessel occlusion. 2. Mild for age atheromatous change about the major arterial vasculature of the head and neck as above. No hemodynamically significant or correctable stenosis. 3. Diffuse tortuosity of the major arterial vasculature of the head and neck, suggesting chronic underlying hypertension. Electronically Signed   By: Janell Quiet.D.  On: 07/27/2021 21:30   MR BRAIN WO CONTRAST  Result Date: 07/27/2021 CLINICAL DATA:  Neuro deficit, acute, stroke suspected EXAM: MRI HEAD WITHOUT CONTRAST TECHNIQUE: Multiplanar, multiecho pulse sequences of the brain and surrounding structures were obtained without intravenous contrast. COMPARISON:  None. FINDINGS: Brain: Foci of reduced diffusion are present in the right corona radiata, left frontal cortex and left parietotemporal junction.  No evidence of hemorrhage. There is no intracranial mass or mass effect. There is no hydrocephalus or extra-axial fluid collection. Patchy foci of T2 hyperintensity in the supratentorial white are nonspecific but may reflect mild chronic microvascular ischemic changes. Small chronic right cerebellar infarcts. Vascular: Major vessel flow voids at the skull base are preserved. Skull and upper cervical spine: Normal marrow signal is preserved. Sinuses/Orbits: Paranasal sinuses are aerated. Orbits are unremarkable. Other: Sella is unremarkable.  Mastoid air cells are clear. IMPRESSION: Few small acute infarcts as described.  No evidence of hemorrhage. Chronic microvascular ischemic changes. Small chronic right cerebellar infarcts. Electronically Signed   By: Guadlupe Spanish M.D.   On: 07/27/2021 15:50   ECHOCARDIOGRAM COMPLETE  Result Date: 07/28/2021    ECHOCARDIOGRAM REPORT   Patient Name:   VELIA PAMER Date of Exam: 07/28/2021 Medical Rec #:  [id number]         Height:       66.0 in Accession #:    [id number]        Weight:       119.0 lb Date of Birth:  04/26/1924         BSA:          1.604 m Patient Age:    89 years          BP:           124/79 mmHg Patient Gender: F                 HR:           84 bpm. Exam Location:  Inpatient Procedure: 2D Echo, Intracardiac Opacification Agent, Color Doppler and Cardiac            Doppler Indications:    Stroke I63.9  History:        Patient has prior history of Echocardiogram examinations, most                 recent 03/19/2020. Pulmonary HTN; Signs/Symptoms:Dyspnea.  Sonographer:    Roosvelt Maser RDCS Referring Phys: [id number] [facility] IMPRESSIONS  1. Left ventricular ejection fraction, by estimation, is 55 to 60%. The left ventricle has normal function. The left ventricle has no regional wall motion abnormalities. Left ventricular diastolic function could not be evaluated.  2. Right ventricular systolic function is normal. The right ventricular size is normal.  There is moderately elevated pulmonary artery systolic pressure.  3. Left atrial size was moderately dilated.  4. Right atrial size was moderately dilated.  5. The mitral valve is normal in structure. Mild mitral valve regurgitation. No evidence of mitral stenosis.  6. Tricuspid valve regurgitation is severe.  7. The aortic valve is tricuspid. Aortic valve regurgitation is mild. Mild to moderate aortic valve sclerosis/calcification is present, without any evidence of aortic stenosis.  8. Aortic dilatation noted. There is mild dilatation of the aortic root, measuring 44 mm.  9. The inferior vena cava is dilated in size with >50% respiratory variability, suggesting right atrial pressure of 8 mmHg. FINDINGS  Left Ventricle: Left ventricular ejection fraction, by estimation, is 55 to  60%. The left ventricle has normal function. The left ventricle has no regional wall motion abnormalities. Definity contrast agent was given IV to delineate the left ventricular  endocardial borders. The left ventricular internal cavity size was normal in size. There is no left ventricular hypertrophy. Left ventricular diastolic function could not be evaluated due to atrial fibrillation. Left ventricular diastolic function could  not be evaluated. Right Ventricle: The right ventricular size is normal. Right ventricular systolic function is normal. There is moderately elevated pulmonary artery systolic pressure. The tricuspid regurgitant velocity is 3.12 m/s, and with an assumed right atrial pressure of 8 mmHg, the estimated right ventricular systolic pressure is 46.9 mmHg. Left Atrium: Left atrial size was moderately dilated. Right Atrium: Right atrial size was moderately dilated. Pericardium: There is no evidence of pericardial effusion. Mitral Valve: The mitral valve is normal in structure. Mild mitral valve regurgitation. No evidence of mitral valve stenosis. Tricuspid Valve: The tricuspid valve is normal in structure. Tricuspid valve  regurgitation is severe. No evidence of tricuspid stenosis. Aortic Valve: The aortic valve is tricuspid. Aortic valve regurgitation is mild. Aortic regurgitation PHT measures 319 msec. Mild to moderate aortic valve sclerosis/calcification is present, without any evidence of aortic stenosis. Aortic valve mean gradient measures 2.0 mmHg. Aortic valve peak gradient measures 4.3 mmHg. Aortic valve area, by VTI measures 2.54 cm. Pulmonic Valve: The pulmonic valve was grossly normal. Pulmonic valve regurgitation is trivial. No evidence of pulmonic stenosis. Aorta: Aortic dilatation noted. There is mild dilatation of the aortic root, measuring 44 mm. Venous: The inferior vena cava is dilated in size with greater than 50% respiratory variability, suggesting right atrial pressure of 8 mmHg. IAS/Shunts: No atrial level shunt detected by color flow Doppler.  LEFT VENTRICLE PLAX 2D LVIDd:         4.30 cm LVIDs:         3.60 cm LV PW:         1.10 cm LV IVS:        0.90 cm LVOT diam:     2.20 cm LV SV:         47 LV SV Index:   29 LVOT Area:     3.80 cm  RIGHT VENTRICLE             IVC RV Basal diam:  3.30 cm     IVC diam: 2.30 cm RV S prime:     10.70 cm/s TAPSE (M-mode): 1.0 cm LEFT ATRIUM           Index       RIGHT ATRIUM           Index LA diam:      3.20 cm 1.99 cm/m  RA Area:     20.30 cm LA Vol (A4C): 68.4 ml 42.64 ml/m RA Volume:   63.00 ml  39.27 ml/m  AORTIC VALVE AV Area (Vmax):    2.77 cm AV Area (Vmean):   2.64 cm AV Area (VTI):     2.54 cm AV Vmax:           104.00 cm/s AV Vmean:          73.100 cm/s AV VTI:            0.184 m AV Peak Grad:      4.3 mmHg AV Mean Grad:      2.0 mmHg LVOT Vmax:         75.70 cm/s LVOT Vmean:  50.700 cm/s LVOT VTI:          0.123 m LVOT/AV VTI ratio: 0.67 AI PHT:            319 msec  AORTA Ao Root diam: 4.40 cm TRICUSPID VALVE TR Peak grad:   38.9 mmHg TR Vmax:        312.00 cm/s  SHUNTS Systemic VTI:  0.12 m Systemic Diam: 2.20 cm Olga Millers MD Electronically  signed by Olga Millers MD Signature Date/Time: 07/28/2021/2:33:44 PM    Final     PHYSICAL EXAM    PE: GENERAL: slightly frail, but well appearing elderly female. Awake, alert in NAD.  HEENT: normocephalic and atraumatic. LUNGS - Normal respiratory effort. Barrell chested.  CV - RRR on tele. ABDOMEN - Soft, nontender. Ext: warm, well perfused. Petechiae to ankles/feet.  Psych: affect light. Smiling and laughing.     NEURO:  Mental Status: Alert and oriented x4. Speech/Language: speech is without dysarthria or aphasia.  Naming, repetition, fluency, and comprehension intact.   Cranial Nerves:  II: PERRL 76mm/brisk. visual fields full.  III, IV, VI: EOMI. Lid elevation symmetric and full.  V: sensation is intact and symmetrical to face.  VII: Smile is symmetrical.  VIII:very HOH IX, X: palate elevation is symmetric. Phonation normal.  XI: normal sternocleidomastoid and trapezius muscle strength. ZHY:[id number] is symmetrical without fasciculations.    Motor:  Strength is 4+/5 throughout.  Tone is normal. Bulk is decreased.  Sensation- Intact to light touch bilaterally in all four extremities. Extinction absent to light touch to DSS.  Coordination: FTN intact bilaterally-slightly dysmetric on left. HKS intact bilaterally. No pronator drift.  Gait- deferred.    ASSESSMENT/PLAN Stephanie Reyes is a 87 y.o. female with history of orthostatic hypotension, syncope, AF, CHF, HTN, frequent falls, and sinus tachycardia. Patient presented today secondary to a presyncopal spell at home. Patient states she was eating her breakfast thinking of cleaning up a lot of papers, and stood up and her left leg felt heavy. She felt some nausea. She did not fall, but was able to lower herself to the floor with only sustaining an abrasion on her right forearm. She pushed her chair with rollers over to where she could use the phone. Called her daughter and daughter called 911. No LOC.    On arrival to  ED, patient was found to be in AF RVR with rate 146. Cardiology was consulted for AF. MRI was performed for workup of syncope and it showed a few small acute infarct without hemorrhage. Heparin and Cardizem IV drips were started.     She has been seen by cardiologist with respect other new diagnosed atrial fibrillation, and she will be started on eliquis 2.5mg  daily, as well as consideration for outpatient cardioversion after 3 weeks.     Stroke: R CR and L parietal infarcts cardioembolic from atrial fibrillation not on [facility] Code Stroke  CT head No acute abnormality.  Small vessel disease. Atrophy.  ASPECTS 10.     CTA head & neck : 1. Negative CTA for large vessel occlusion. 2. Mild for age atheromatous change about the major arterial vasculature of the head and neck as above. No hemodynamically significant or correctable stenosis. 3. Diffuse tortuosity of the major arterial vasculature of the head and neck, suggesting chronic underlying hypertension.  MRI  BRAIN: Foci of reduced diffusion are present in the right corona radiata, left frontal cortex and left parietotemporal junction  2D Echo LVEF 55-60%, dilated left atria. No  IA shunt   LDL 73 HgbA1c No results found for requested labs within last 33295 hours. VTE prophylaxis - scd    Diet   Diet regular Room service appropriate? Yes; Fluid consistency: Thin   No antithrombotic prior to admission, now on heparin IV. Agree with cardiology for eliquis 2.5mg  bid.  Therapy recommendations:  pending Disposition:  pending  Hypertension Home meds:  lasix Stable Permissive hypertension (OK if < 220/120) but gradually normalize in 5-7 days Long-term BP goal normotensive  Hyperlipidemia Home meds:  none, r  LDL 73, goal < 70     HgbA1c No results found for requested labs within last 18841 hours., goal < 7.0 CBGs Recent Labs    07/27/21 1232  GLUCAP 133*    SSI  Other Stroke Risk Factors  Advanced Age >/= 80   Other Active  Problems    Hospital day # 0     To contact Stroke Continuity provider, please refer to [email or web address]. After hours, contact General Neurology

## 2021-07-28 NOTE — Plan of Care (Signed)
Patient progressing 

## 2021-07-29 ENCOUNTER — Other Ambulatory Visit (HOSPITAL_COMMUNITY): Payer: Self-pay

## 2021-07-29 DIAGNOSIS — R55 Syncope and collapse: Secondary | ICD-10-CM | POA: Diagnosis not present

## 2021-07-29 DIAGNOSIS — I4891 Unspecified atrial fibrillation: Secondary | ICD-10-CM | POA: Diagnosis not present

## 2021-07-29 LAB — CBC
HCT: 37 % (ref 36.0–46.0)
Hemoglobin: 12.5 g/dL (ref 12.0–15.0)
MCH: 32 pg (ref 26.0–34.0)
MCHC: 33.8 g/dL (ref 30.0–36.0)
MCV: 94.6 fL (ref 80.0–100.0)
Platelets: 205 10*3/uL (ref 150–400)
RBC: 3.91 MIL/uL (ref 3.87–5.11)
RDW: 12.1 % (ref 11.5–15.5)
WBC: 5.3 10*3/uL (ref 4.0–10.5)
nRBC: 0 % (ref 0.0–0.2)

## 2021-07-29 LAB — BASIC METABOLIC PANEL
Anion gap: 9 (ref 5–15)
BUN: 8 mg/dL (ref 8–23)
CO2: 22 mmol/L (ref 22–32)
Calcium: 9.2 mg/dL (ref 8.9–10.3)
Chloride: 101 mmol/L (ref 98–111)
Creatinine, Ser: 0.81 mg/dL (ref 0.44–1.00)
GFR, Estimated: >60 mL/min (ref >60)
Glucose, Bld: 93 mg/dL (ref 70–99)
Potassium: 4.1 mmol/L (ref 3.5–5.1)
Sodium: 132 mmol/L — ABNORMAL LOW (ref 135–145)

## 2021-07-29 MED ORDER — ATORVASTATIN CALCIUM 10 MG PO TABS: 10.0000 mg | ORAL_TABLET | Freq: Every evening | ORAL | 0 refills | Status: DC

## 2021-07-29 MED ORDER — ENSURE ENLIVE PO LIQD: 237.0000 mL | Freq: Two times a day (BID) | ORAL | Status: DC

## 2021-07-29 MED ORDER — AMIODARONE HCL 200 MG PO TABS: ORAL_TABLET | ORAL | 0 refills | Status: DC

## 2021-07-29 MED ORDER — APIXABAN 2.5 MG PO TABS: 2.5000 mg | ORAL_TABLET | Freq: Two times a day (BID) | ORAL | 0 refills | Status: DC

## 2021-07-29 MED ORDER — ADULT MULTIVITAMIN W/MINERALS CH: 1.0000 | ORAL_TABLET | Freq: Every day | ORAL | Status: DC

## 2021-07-29 MED ORDER — APIXABAN 2.5 MG PO TABS
2.5000 mg | ORAL_TABLET | Freq: Two times a day (BID) | ORAL | 0 refills | Status: DC
  Filled 2021-07-29: qty 60, 30d supply, fill #0

## 2021-07-29 MED ORDER — DILTIAZEM HCL ER COATED BEADS 120 MG PO CP24
120.0000 mg | ORAL_CAPSULE | Freq: Every day | ORAL | Status: DC
  Administered 2021-07-29: 120 mg via ORAL
  Filled 2021-07-29: qty 1

## 2021-07-29 MED ORDER — DILTIAZEM HCL ER COATED BEADS 120 MG PO CP24
120.0000 mg | ORAL_CAPSULE | Freq: Every day | ORAL | 0 refills | Status: DC
  Filled 2021-07-29: qty 30, 30d supply, fill #0

## 2021-07-29 MED ORDER — DILTIAZEM HCL ER COATED BEADS 120 MG PO CP24: 120.0000 mg | ORAL_CAPSULE | Freq: Every day | ORAL | 0 refills | Status: DC

## 2021-07-29 MED ORDER — AMIODARONE HCL 200 MG PO TABS
ORAL_TABLET | ORAL | 0 refills | Status: DC
  Filled 2021-07-29: qty 44, 30d supply, fill #0

## 2021-07-29 MED ORDER — ATORVASTATIN CALCIUM 10 MG PO TABS
10.0000 mg | ORAL_TABLET | Freq: Every evening | ORAL | 0 refills | Status: DC
  Filled 2021-07-29: qty 30, 30d supply, fill #0

## 2021-07-29 NOTE — Progress Notes (Signed)
Occupational therapy evaluation  Pt. Has decreased I and safety with ADLs and mobility. Pt. Is not safe using rw and was bumping into things in room and not staying close to rw. Pt. Daughters came in during session and observed pt. Unsafe use of walker. Pt. Is leaving today and discussed with pt. And her dtr that she will need 24/7 care at dc and they are able to provide. Discussed that she needs HH OT and PT and her dtrs states that they are in agreement with HH. Acute OT would follow but she is scheduled to be dc today.     07/29/21 1000  OT Visit Information  Last OT Received On 07/29/21  Assistance Needed +1  History of Present Illness 87 year old female presented on 9/21 to [facility] ED for presyncopal episode. Pt found to be tachycardic to 136, and hypertensive . Left hip x-ray negative for acute fracture. Brain MRI showed a few small acute infarct but no evidence of hemorrhage. Admitted for treatement of A-fib with RVR , smaill chronic right cerebellar infarcts. PMH: hypertension as well as recent hypotension, syncope, PACs, diastolic heart failure, frequent falls status post left hip fracture and repair 2019,  Precautions  Precautions Fall  Restrictions  Weight Bearing Restrictions No  Home Living  Family/patient expects to be discharged to: Private residence  Living Arrangements Alone  Available Help at Discharge Family;Available 24 hours/day  Type of Home Apartment  Home Access Level entry  Home Layout One level  Bathroom Shower/Tub Tub/shower unit  Bathroom Toilet Handicapped height  Bathroom Accessibility Yes  Home Equipment Walker - 4 wheels;Cane - single point;Tub bench;Hand held shower head  Prior Function  Level of Independence Independent  Comments walks in hallway with use of handrail, takes bird baths, does cooking and general cleaning, family does shopping, deep cleaning, transportation  Communication  Communication HOH  Pain Assessment  Pain Assessment No/denies pain   Cognition  Arousal/Alertness Awake/alert  Behavior During Therapy WFL for tasks assessed/performed  Overall Cognitive Status Within Functional Limits for tasks assessed  Upper Extremity Assessment  Upper Extremity Assessment Overall WFL for tasks assessed  Lower Extremity Assessment  Lower Extremity Assessment Defer to PT evaluation  Cervical / Trunk Assessment  Cervical / Trunk Assessment Kyphotic  ADL  Overall ADL's  Needs assistance/impaired  Eating/Feeding Independent  Grooming Wash/dry face;Wash/dry hands;Min guard;Standing  Upper Body Bathing Supervision/ safety;Set up;Sitting  Lower Body Bathing Minimal assistance;Sit to/from stand  Upper Body Dressing  Minimal assistance;Sitting  Lower Body Dressing Minimal assistance;Sit to/from stand  Toilet Transfer Minimal assistance;Comfort height toilet;Ambulation;RW;Grab bars  Toileting- Clothing Manipulation and Hygiene Moderate assistance;Sit to/from stand  Functional mobility during ADLs Minimal assistance;Rolling walker  General ADL Comments Pt. was able to preform ADLs sitting in chair. Pt. was min guard to Min A to maintain balance in standing for peri hygiene.  Vision- History  Baseline Vision/History 1 Wears glasses  Ability to See in Adequate Light 0 Adequate  Patient Visual Report No change from baseline  Vision- Assessment  Vision Assessment? No apparent visual deficits  Bed Mobility  Overal bed mobility Needs Assistance  Bed Mobility Sit to Supine  Sit to supine Supervision (with hob elevated.)  Transfers  Overall transfer level Needs assistance  Equipment used Rolling walker (2 wheeled)  Transfers Sit to/from Stand  Sit to Stand Min assist  General transfer comment cues for proper hand placemetn  Balance  Overall balance assessment Needs assistance  Sitting balance-Leahy Scale Normal  Standing balance-Leahy Scale Poor  OT - End of Session  Equipment Utilized During Treatment Rolling walker  Activity Tolerance  Patient limited by fatigue  Patient left in bed;with call bell/phone within reach;with bed alarm set;with family/visitor present  Nurse Communication  (ok therapy. informed her that family is able to do 24/7 and is agreeable to home health.)  OT Assessment  OT Recommendation/Assessment Patient needs continued OT Services  OT Visit Diagnosis Unsteadiness on feet (R26.81);Muscle weakness (generalized) (M62.81)  OT Problem List Decreased strength;Decreased activity tolerance;Impaired balance (sitting and/or standing);Decreased safety awareness;Decreased knowledge of use of DME or AE  Barriers to Discharge  (pt. is going to have 24/7 at home.)  OT Plan  OT Frequency (ACUTE ONLY) Min 2X/week  OT Treatment/Interventions (ACUTE ONLY) Self-care/ADL training;DME and/or AE instruction;Therapeutic activities;Patient/family education  AM-PAC OT "6 Clicks" Daily Activity Outcome Measure (Version 2)  Help from another person eating meals? 4  Help from another person taking care of personal grooming? 3  Help from another person toileting, which includes using toliet, bedpan, or urinal? 2  Help from another person bathing (including washing, rinsing, drying)? 3  Help from another person to put on and taking off regular upper body clothing? 3  Help from another person to put on and taking off regular lower body clothing? 3  6 Click Score 18  Progressive Mobility  What is the highest level of mobility based on the progressive mobility assessment? Level 4 (Walks with assist in room) - Balance while marching in place and cannot step forward and back - Complete  Mobility Ambulated with assistance in room  OT Recommendation  Follow Up Recommendations Home health OT  OT Equipment None recommended by OT  Individuals Consulted  Consulted and Agree with Results and Recommendations Patient;Family member/caregiver  Acute Rehab OT Goals  Patient Stated Goal go home  OT Time Calculation  OT Start Time (ACUTE ONLY)  1206  OT Stop Time (ACUTE ONLY) 1235  OT Time Calculation (min) 29 min  OT General Charges  $OT Visit 1 Visit  OT Evaluation  $OT Eval Moderate Complexity 1 Mod  Derrek Gu OT/L '

## 2021-07-29 NOTE — Discharge Summary (Signed)
Name: Stephanie Reyes MRN: [id number] DOB: [birth year] 87 y.o. PCP: Shon Hale, MD  Date of Admission: 07/27/2021 12:21 PM Date of Discharge: 07/29/21 Attending Physician: Inez Catalina, MD  Discharge Diagnosis: Atrial fibrillation with RVR Presyncopal episode Acute multifocal small cerebral infarcts  Discharge Medications: Allergies as of 07/29/2021       Reactions   Fluvirin  [influenza Virus Vaccine] Anaphylaxis   Aspirin Nausea And Vomiting, Swelling   Clindamycin/lincomycin Nausea And Vomiting   Hydrocodone Nausea And Vomiting   headache   Penicillins Nausea And Vomiting, Swelling   Ampicillin Nausea And Vomiting   Eggs Or Egg-derived Products    Egg whites Reports not being able to use albumin        Medication List     STOP taking these medications    midodrine 5 MG tablet Commonly known as: PROAMATINE       TAKE these medications    amiodarone 200 MG tablet Commonly known as: PACERONE Take 1 tablet (200 mg total) by mouth 2 (two) times daily for 14 days, THEN 1 tablet (200 mg total) daily for 16 days. Start taking on: July 29, 2021   apixaban 2.5 MG Tabs tablet Commonly known as: ELIQUIS Take 1 tablet (2.5 mg total) by mouth 2 (two) times daily.   atorvastatin 10 MG tablet Commonly known as: LIPITOR Take 1 tablet (10 mg total) by mouth at bedtime.   diltiazem 120 MG 24 hr capsule Commonly known as: CARDIZEM CD Take 1 capsule (120 mg total) by mouth daily. Start taking on: July 30, 2021   furosemide 20 MG tablet Commonly known as: LASIX TAKE 1 TABLET BY MOUTH EVERY DAY   multivitamin with minerals Tabs tablet Take 1 tablet by mouth every other day.        Disposition and follow-up:   Stephanie Reyes was discharged from [facility] in Stable condition.  At the hospital follow up visit please address:  Atrial fibrillation with RVR: Rate control but not rhythm control achieved.  Per Dr. Rennis Golden  with cardiology, patient to start on amiodarone, carvedilol, Eliquis, to hold midodrine at home.  Patient will be seen 10/4 in cardiology office for follow-up and to further discuss outpatient cardioversion. Acute multifocal small cerebral infarcts: Patient without significant neurological focal deficits.  Some left upper extremity dysmetria likely from prior stroke.  Etiology likely secondary to A. fib related embolism.  Patient started on Eliquis 2.5 twice daily.  PT/OT recommending HH PT/OT with 24-hour supervision.  Family present for discussion of 24-hour supervision requirement.  2.  Labs / imaging needed at time of follow-up: EKG  3.  Pending labs/ test needing follow-up: None  Follow-up Appointments:  Follow-up Information     [facility]. Schedule an appointment as soon as possible for a visit in 1 month(s).   Specialty: Neurology Why: stroke clinic Contact information: [address] [place] [address] [phone]        Chrystie Nose, MD Follow up.   Specialty: Cardiology Why: Call to confirm appointment on October 4 2:45 PM with cardiology nurse practitioner. Contact information: [address] [place] [address] [phone]                 Hospital Course by problem list:  Stephanie Reyes is a 87 year old female with history of hypertension as well as recent hypotension, syncope, PACs, diastolic heart failure, frequent falls status post left hip fracture  and repair 2019, who presented on 9/21 to [facility] ED for presyncopal episode and admitted for new diagnosis of atrial fibrillation with RVR, and found to have small acute cerebral infarcts.   #Presyncopal episode #Atrial fibrillation with RVR #Hx of PACs, recurrent presyncopal episodes Patient presented with 10 D Hx of malaise and presyncope with acute presyncopal episode and mechanical fall on 9/21. EKG showed atrial fibrillation with HR 146  bpm. CHA2DS2-VASc = 6 (HTN, age x2, female, and new acute stroke). Cardiology was consulted in the ED.  Was started on Cardizem drip and rate control was achieved without rhythm control. Echo 9/22: EF 55 to 60%, normal LV function, no regional wall motion abnormalities, moderately dilated LA, severe TR, mild to mod AS, no atrial thrombus, no shunt.  Patient was started on heparin drip.  On 9/22, patient was transitioned from heparin to Eliquis 2.5 mg twice daily and amiodarone was added.  Patient continued to be in A. fib with good rate control.  Due to overbooked hospital schedule, cardiology will plan for outpatient cardioversion, with the intent to improve symptoms secondary to atrial fibrillation through cardioversion. 9/23 patient transition from Cardizem drip to p.o. Cardizem LA.  Patient will discharge on amiodarone 200 mg twice daily for 2 weeks followed by 200 mg once daily, as well as Cardizem LA 120 mg daily, Eliquis 2.5 mg twice daily, atorvastatin 10 mg nightly.  Midodrine will be discontinued at discharge.  Patient will follow up with cardiology on October 4 2:45 PM for hospital follow-up visit, and to schedule for cardioversion.   #Acute multifocal small cerebral infarcts, likely embolic etiology #Small chronic right cerebellar infarcts Patient presented reporting left lower extremity weakness, concerning for stroke.  MRI brain showed few small acute infarcts in the right corona radiata, left frontal cortex, and left parietotemporal junction.  Also noted small chronic right cerebellar infarcts.  CTA head and neck without LVO, mild atherosclerotic disease for age.  Neurological exam significant for left upper extremity dysmetria without any other focal neurologic deficits. Neurology consulted. Etiology likely embolic in the setting of atrial fibrillation not on AC.  Echo normal EF, no LA thrombus, no shunt. TSH normal, LDL 73, Hgb A1c 5.9%. Passed bedside swallow, on regular diet.  PT/OT  recommended [facility] PT/OT with 24-hour supervision.   #Mechanical fall 2/2 presyncope Patient reports controlled fall in the setting of presyncopal episode.  Without LOC or head injury.  Left hip x-ray negative for acute fracture.  Patient has small left upper extremity abrasion.  No extremity pain.  MRI brain without evidence of intracranial hemorrhage.  Reduced ambulation last 10 days, feels off balance, does not ambulate with assistive device.  Patient was evaluated by physical therapy, was noted to have unsafe ambulation with use of walker.  Patient refuses SNF, states she will have 24-hour assistance at home.  Patient will likely decline home health PT, stating her family will help her with exercises from last time.   #HTN #Chronic systolic heart failure Patient follows with cardiology for hypertension.  History of labile blood pressures.  Was on amlodipine 5 mg daily with midodrine as needed for intermittent hypotension.  Home antihypertensives were held for Bps 110s systolic.  Home antihypertensive medication can be restarted at discharge: Amlodipine 5 mg daily, Lasix 20 mg once daily.  Subjective on day of discharge: No acute events or concerns overnight.  Patient seated in chair, granddaughter and son at bedside. Patient stating she feels very tired.  She denies new speech difficulties, swallowing  difficulties, focal weakness, visual changes.  She was informed she will need to work with physical therapy prior to discharge.  All questions and concerns from patient and family were addressed.  Discharge Exam:   BP (!) 147/90 (BP Location: Right Arm)   Pulse 95   Temp (!) 97.5 F (36.4 C) (Oral)   Resp 18   Ht 5\' 6"  (1.676 m)   Wt 48.5 kg   SpO2 99%   BMI 17.26 kg/m  Physical Exam: General: Elderly Caucasian female, NAD HENT: normocephalic, atraumatic EYES: conjunctiva non-erythematous, no scleral icterus CV: irregular rate, normal rhythm, no murmurs, rubs, gallops, pectus carinatum, trace  bilateral lower extremity edema Pulmonary: sating well on RA, trace crackles left lung base Abdominal: non-distended, soft, non-tender to palpation, normal BS Skin: Warm and dry, healing erythematous rash bilateral ankles (patient reports contact dermatitis) Neurological: Mental Status: Patient is awake, alert, oriented x3, fund of knowledge appropriate for age Speech and language: normal, fluent  Cranial Nerves: II: Pupils equal, round, and reactive to light III,IV, VI: EOMI all cardinal directions, without ptosis or diploplia.  V: Facial sensation is symmetric  VII: Face appears symmetric.  VIII: Mild to moderate hearing loss bilaterally X: Uvula and palate elevate symmetrically XI: Shoulder shrug is symmetric. XII: Tongue is midline without atrophy or fasciculations.  Motor:  BUE: 5/5 BLE: 5/5 Sensory: Sensation is grossly intact bilateral UEs & LEs Deep Tendon Reflexes: 2+ L biceps patellar, hyperreflexia right biceps patellar Coordination: FTN with left upper extremity dysmetria.  Normal FTN right upper extremity.  HTS intact bilaterally. Gait: deferred   Psych: normal affect  Pertinent Labs, Studies, and Procedures:  CBC Latest Ref Rng & Units 07/29/2021 07/28/2021 07/27/2021  WBC 4.0 - 10.5 K/uL 5.3 5.5 5.1  Hemoglobin 12.0 - 15.0 g/dL 07/29/2021 38.2 50.5  Hematocrit 36.0 - 46.0 % 37.0 38.2 41.4  Platelets 150 - 400 K/uL 205 191 208    BMP Latest Ref Rng & Units 07/29/2021 07/28/2021 07/27/2021  Glucose 70 - 99 mg/dL 93 97 07/29/2021)  BUN 8 - 23 mg/dL 8 10 13   Creatinine 0.44 - 1.00 mg/dL 673(A 1.93)  BUN/Creat Ratio 12 - 28 - - -  Sodium 135 - 145 mmol/L 132(L) 136 135  Potassium 3.5 - 5.1 mmol/L 4.1 3.1(L) 3.9  Chloride 98 - 111 mmol/L 101 102 102  CO2 22 - 32 mmol/L 22 23 20(L)  Calcium 8.9 - 10.3 mg/dL 9.2 9.2 9.3   Hemoglobin A1c 7.90 (Abnormal) Collected: 07/28/21 0430  Specimen: Blood from Vein Updated: 07/28/21 2036   Hgb A1c MFr Bld 5.9 High  %    Mean  Plasma Glucose 123 mg/dL   Heparin level (unfractionated) 07/30/21 Collected: 07/28/21 1112  Specimen: Blood from Hand Updated: 07/28/21 1228   Heparin Unfractionated 0.35 IU/mL   Lipid panel 07/30/21 Collected: 07/28/21 0430  Specimen: Blood from Vein Updated: 07/28/21 0629   Cholesterol 141 mg/dL    Triglycerides 44 mg/dL    HDL 59 mg/dL    Total CHOL/HDL Ratio 2.4 RATIO    VLDL 9 mg/dL    LDL Cholesterol 73 mg/dL    TSH 07/30/21 Collected: 07/27/21 1640  Specimen: Blood from Vein Updated: 07/27/21 1923   TSH 3.306 uIU/mL    CT ANGIO HEAD NECK W WO CM  Result Date: 07/27/2021 CLINICAL DATA:  Follow-up examination for stroke. EXAM: CT ANGIOGRAPHY HEAD AND NECK TECHNIQUE: Multidetector CT imaging of the head and neck was performed using the standard protocol during bolus administration  of intravenous contrast. Multiplanar CT image reconstructions and MIPs were obtained to evaluate the vascular anatomy. Carotid stenosis measurements (when applicable) are obtained utilizing NASCET criteria, using the distal internal carotid diameter as the denominator. CONTRAST:  24mL OMNIPAQUE IOHEXOL 350 MG/ML SOLN COMPARISON:  MRI from earlier the same day. FINDINGS: CT HEAD FINDINGS Brain: Age-related cerebral atrophy with mild chronic small vessel ischemic disease. Previously identified small ischemic infarcts not well visualized by CT. No other acute large vessel territory infarct. No intracranial hemorrhage. No mass lesion or midline shift. No hydrocephalus or extra-axial fluid collection. Vascular: No hyperdense vessel. Skull: Scalp soft tissues and calvarium within normal limits. Sinuses: Paranasal sinuses and mastoid air cells are largely clear. Orbits: Globes and orbital soft tissues demonstrate no acute finding. Senescent calcifications noted. Review of the MIP images confirms the above findings CTA NECK FINDINGS Aortic arch: Visualized aortic arch normal in caliber with normal branch pattern.  Mild-to-moderate atheromatous change about the arch. No visible stenosis about the origin of the great vessels. Right carotid system: Right common and internal carotid arteries tortuous and partially medialized into the retropharyngeal space, but are widely patent without stenosis, dissection or occlusion. Left carotid system: Left common and internal carotid arteries tortuous but are widely patent without stenosis, dissection or occlusion. Vertebral arteries: Both vertebral arteries arise from the subclavian arteries. No significant proximal subclavian artery stenosis. Strongly dominant right vertebral artery with hypoplastic left vertebral artery. Vertebral arteries patent without stenosis, dissection or occlusion. Skeleton: Osteopenia noted. No discrete or worrisome osseous lesions. Multilevel facet arthrosis noted throughout the cervical spine. Spondylosis at C6-7 with mild-to-moderate spinal stenosis. Patient is edentulous. Other neck: No other acute soft tissue abnormality within the neck. No mass or adenopathy. Upper chest: Pleuroparenchymal scarring noted at the left lung apex. Visualized upper chest demonstrates no other acute finding. Review of the MIP images confirms the above findings CTA HEAD FINDINGS Anterior circulation: Petrous segments patent bilaterally. Mild for age atheromatous change within the carotid siphons without significant stenosis. A1 segments patent bilaterally. Left A1 hypoplastic, accounting for the diminutive left ICA is compared to the right. Normal anterior communicating artery complex. Anterior cerebral arteries patent to their distal aspects without stenosis. No M1 stenosis or occlusion. Normal MCA bifurcations. Distal MCA branches well perfused and symmetric. Posterior circulation: Dominant right vertebral artery widely patent to the vertebrobasilar junction. Right PICA patent at its origin. Hypoplastic left vertebral artery largely terminates in PICA. Left PICA patent as well.  Basilar widely patent to its distal aspect without stenosis. Superior cerebellar arteries patent bilaterally. Both PCAs primarily supplied via the basilar well perfused to their distal aspects. Venous sinuses: Grossly patent allowing for timing the contrast bolus. Anatomic variants: Hypoplastic left A1 segment. Hypoplastic left vertebral artery terminates in PICA. No aneurysm. Review of the MIP images confirms the above findings IMPRESSION: 1. Negative CTA for large vessel occlusion. 2. Mild for age atheromatous change about the major arterial vasculature of the head and neck as above. No hemodynamically significant or correctable stenosis. 3. Diffuse tortuosity of the major arterial vasculature of the head and neck, suggesting chronic underlying hypertension. Electronically Signed   By: Rise Mu M.D.   On: 07/27/2021 21:30   DG Chest 2 View  Result Date: 07/27/2021 CLINICAL DATA:  Syncope EXAM: CHEST - 2 VIEW COMPARISON:  Chest x-ray dated June 06, 2006 FINDINGS: Cardiomegaly, new compared to 2007 prior. Trace left pleural effusion. Moderate hiatal hernia. Scattered bilateral linear opacities, likely due to atelectasis or scarring. No  focal consolidation. No evidence of pneumothorax. IMPRESSION: Cardiomegaly, new compared to 2007 prior. Trace left pleural effusion. Scattered bilateral linear opacities, likely due to atelectasis or scarring. No focal consolidation. Moderate hiatal hernia. Electronically Signed   By: Allegra Lai M.D.   On: 07/27/2021 13:45   MR BRAIN WO CONTRAST  Result Date: 07/27/2021 CLINICAL DATA:  Neuro deficit, acute, stroke suspected EXAM: MRI HEAD WITHOUT CONTRAST TECHNIQUE: Multiplanar, multiecho pulse sequences of the brain and surrounding structures were obtained without intravenous contrast. COMPARISON:  None. FINDINGS: Brain: Foci of reduced diffusion are present in the right corona radiata, left frontal cortex and left parietotemporal junction. No evidence of  hemorrhage. There is no intracranial mass or mass effect. There is no hydrocephalus or extra-axial fluid collection. Patchy foci of T2 hyperintensity in the supratentorial white are nonspecific but may reflect mild chronic microvascular ischemic changes. Small chronic right cerebellar infarcts. Vascular: Major vessel flow voids at the skull base are preserved. Skull and upper cervical spine: Normal marrow signal is preserved. Sinuses/Orbits: Paranasal sinuses are aerated. Orbits are unremarkable. Other: Sella is unremarkable.  Mastoid air cells are clear. IMPRESSION: Few small acute infarcts as described.  No evidence of hemorrhage. Chronic microvascular ischemic changes. Small chronic right cerebellar infarcts. Electronically Signed   By: Guadlupe Spanish M.D.   On: 07/27/2021 15:50   ECHOCARDIOGRAM COMPLETE  Result Date: 07/28/2021    ECHOCARDIOGRAM REPORT   Patient Name:   MARKEETA SCALF Date of Exam: 07/28/2021 Medical Rec #:  [id number]         Height:       66.0 in Accession #:    [id number]        Weight:       119.0 lb Date of Birth:  [birth year]         BSA:          1.604 m Patient Age:    87 years          BP:           124/79 mmHg Patient Gender: F                 HR:           84 bpm. Exam Location:  Inpatient Procedure: 2D Echo, Intracardiac Opacification Agent, Color Doppler and Cardiac            Doppler Indications:    Stroke I63.9  History:        Patient has prior history of Echocardiogram examinations, most                 recent 03/19/2020. Pulmonary HTN; Signs/Symptoms:Dyspnea.  Sonographer:    Roosvelt Maser RDCS Referring Phys: [id number] [facility] IMPRESSIONS  1. Left ventricular ejection fraction, by estimation, is 55 to 60%. The left ventricle has normal function. The left ventricle has no regional wall motion abnormalities. Left ventricular diastolic function could not be evaluated.  2. Right ventricular systolic function is normal. The right ventricular size is normal. There is  moderately elevated pulmonary artery systolic pressure.  3. Left atrial size was moderately dilated.  4. Right atrial size was moderately dilated.  5. The mitral valve is normal in structure. Mild mitral valve regurgitation. No evidence of mitral stenosis.  6. Tricuspid valve regurgitation is severe.  7. The aortic valve is tricuspid. Aortic valve regurgitation is mild. Mild to moderate aortic valve sclerosis/calcification is present, without any evidence of aortic stenosis.  8. Aortic dilatation noted. There  is mild dilatation of the aortic root, measuring 44 mm.  9. The inferior vena cava is dilated in size with >50% respiratory variability, suggesting right atrial pressure of 8 mmHg. FINDINGS  Left Ventricle: Left ventricular ejection fraction, by estimation, is 55 to 60%. The left ventricle has normal function. The left ventricle has no regional wall motion abnormalities. Definity contrast agent was given IV to delineate the left ventricular  endocardial borders. The left ventricular internal cavity size was normal in size. There is no left ventricular hypertrophy. Left ventricular diastolic function could not be evaluated due to atrial fibrillation. Left ventricular diastolic function could  not be evaluated. Right Ventricle: The right ventricular size is normal. Right ventricular systolic function is normal. There is moderately elevated pulmonary artery systolic pressure. The tricuspid regurgitant velocity is 3.12 m/s, and with an assumed right atrial pressure of 8 mmHg, the estimated right ventricular systolic pressure is 46.9 mmHg. Left Atrium: Left atrial size was moderately dilated. Right Atrium: Right atrial size was moderately dilated. Pericardium: There is no evidence of pericardial effusion. Mitral Valve: The mitral valve is normal in structure. Mild mitral valve regurgitation. No evidence of mitral valve stenosis. Tricuspid Valve: The tricuspid valve is normal in structure. Tricuspid valve  regurgitation is severe. No evidence of tricuspid stenosis. Aortic Valve: The aortic valve is tricuspid. Aortic valve regurgitation is mild. Aortic regurgitation PHT measures 319 msec. Mild to moderate aortic valve sclerosis/calcification is present, without any evidence of aortic stenosis. Aortic valve mean gradient measures 2.0 mmHg. Aortic valve peak gradient measures 4.3 mmHg. Aortic valve area, by VTI measures 2.54 cm. Pulmonic Valve: The pulmonic valve was grossly normal. Pulmonic valve regurgitation is trivial. No evidence of pulmonic stenosis. Aorta: Aortic dilatation noted. There is mild dilatation of the aortic root, measuring 44 mm. Venous: The inferior vena cava is dilated in size with greater than 50% respiratory variability, suggesting right atrial pressure of 8 mmHg. IAS/Shunts: No atrial level shunt detected by color flow Doppler.  LEFT VENTRICLE PLAX 2D LVIDd:         4.30 cm LVIDs:         3.60 cm LV PW:         1.10 cm LV IVS:        0.90 cm LVOT diam:     2.20 cm LV SV:         47 LV SV Index:   29 LVOT Area:     3.80 cm  RIGHT VENTRICLE             IVC RV Basal diam:  3.30 cm     IVC diam: 2.30 cm RV S prime:     10.70 cm/s TAPSE (M-mode): 1.0 cm LEFT ATRIUM           Index       RIGHT ATRIUM           Index LA diam:      3.20 cm 1.99 cm/m  RA Area:     20.30 cm LA Vol (A4C): 68.4 ml 42.64 ml/m RA Volume:   63.00 ml  39.27 ml/m  AORTIC VALVE AV Area (Vmax):    2.77 cm AV Area (Vmean):   2.64 cm AV Area (VTI):     2.54 cm AV Vmax:           104.00 cm/s AV Vmean:          73.100 cm/s AV VTI:  0.184 m AV Peak Grad:      4.3 mmHg AV Mean Grad:      2.0 mmHg LVOT Vmax:         75.70 cm/s LVOT Vmean:        50.700 cm/s LVOT VTI:          0.123 m LVOT/AV VTI ratio: 0.67 AI PHT:            319 msec  AORTA Ao Root diam: 4.40 cm TRICUSPID VALVE TR Peak grad:   38.9 mmHg TR Vmax:        312.00 cm/s  SHUNTS Systemic VTI:  0.12 m Systemic Diam: 2.20 cm Olga Millers MD Electronically  signed by Olga Millers MD Signature Date/Time: 07/28/2021/2:33:44 PM    Final    DG Hip Unilat W or Wo Pelvis 2-3 Views Left  Result Date: 07/27/2021 CLINICAL DATA:  Syncope and fall with left hip pain. EXAM: DG HIP (WITH OR WITHOUT PELVIS) 2-3V LEFT COMPARISON:  02/07/2018 FINDINGS: The patient has had previous bipolar hip replacement on the left. Artifact overlies the left hemipelvis but I do not see an acute pelvic or hip region fracture. IMPRESSION: Overlying artifact. No acute fracture seen. Previous bipolar left hip replacement. Electronically Signed   By: Paulina Fusi M.D.   On: 07/27/2021 13:42     Discharge Instructions: Discharge Instructions     Ambulatory referral to Neurology   Complete by: As directed    Follow up with stroke clinic NP (Jessica Vanschaick or Darrol Angel, if both not available, consider Manson Allan, or Ahern) at [facility] in about 4 weeks. Thanks.   Call MD for:  difficulty breathing, headache or visual disturbances   Complete by: As directed    Call MD for:  extreme fatigue   Complete by: As directed    Call MD for:  persistant dizziness or light-headedness   Complete by: As directed    Face-to-face encounter (required for Medicare/Medicaid patients)   Complete by: As directed    I Ellison Carwin certify that this patient is under my care and that I, or a nurse practitioner or physician's assistant working with me, had a face-to-face encounter that meets the physician face-to-face encounter requirements with this patient on 07/29/2021. The encounter with the patient was in whole, or in part for the following medical condition(s) which is the primary reason for home health care (List medical condition): Atrial fibrillation, CVA   The encounter with the patient was in whole, or in part, for the following medical condition, which is the primary reason for home health care: Atrial fibrillation   I certify that, based on my findings, the following services are  medically necessary home health services: Physical therapy   Reason for Medically Necessary Home Health Services:  Therapy- Investment banker, operational, Patent examiner Therapy- Therapeutic Exercises to Increase Strength and Endurance     My clinical findings support the need for the above services:  Cognitive impairments, dementia, or mental confusion  that make it unsafe to leave home Unsafe ambulation due to balance issues     Further, I certify that my clinical findings support that this patient is homebound due to: Unsafe ambulation due to balance issues   Home Health   Complete by: As directed    To provide the following care/treatments:  PT OT         Signed: Ellison Carwin, MD 07/29/2021, 1:25 PM   Pager: [phone]

## 2021-07-29 NOTE — Progress Notes (Signed)
SLP Cancellation Note  Patient Details Name: Stephanie Reyes MRN: [id number] DOB: [birth year]   Cancelled treatment:        Attempted to see pt for cognitive linguistic evaluation.  Pt politely declined assessment.  She states she feels she is at her cognitive linguistic baseline, but is tired.  She states that she answered year incorrectly x1 this admission, but otherwise has had no appreciable errors.  Pt was able to participate appropriately in conversation with SLP for around 8 minutes without any obvious word finding difficulties or confusion. P's speech is clear and free from dysarthria.  Will defer evaluation at this time.  Pt state's she is going home today. If there are any new symptoms, recommend assessment in outpatient setting.     Kerrie Pleasure, MA, CCC-SLP Acute Rehabilitation Services Office: [phone]  07/29/2021, 10:27 AM

## 2021-07-29 NOTE — Progress Notes (Signed)
Initial Nutrition Assessment  DOCUMENTATION CODES:  Underweight  INTERVENTION:  Continue regular diet.  Add Ensure Plus High Protein po BID, each supplement provides 350 kcal and 20 grams of protein.  Add Magic cup TID with meals, each supplement provides 290 kcal and 9 grams of protein.  Add MVI with minerals daily.  Encourage PO intake.  NUTRITION DIAGNOSIS:  Increased nutrient needs related to acute illness (new onset A-fib with RVR) as evidenced by estimated needs.  GOAL:  Patient will meet greater than or equal to 90% of their needs  MONITOR:  PO intake, Supplement acceptance, Labs, Weight trends, I & O's  REASON FOR ASSESSMENT:  Malnutrition Screening Tool    ASSESSMENT:  87 yo female with a PMH of hypertension as well as recent hypotension, syncope, PACs, diastolic heart failure, frequent falls s/p left hip fracture and repair 2019, who presented with presyncopal episode and admitted for new diagnosis of atrial fibrillation with RVR, and found to have small acute cerebral infarcts.  RD working remotely. Spoke with pt's daughter on pt's room phone.  Per Epic, pt ate 100% of dinner yesterday and 100% of breakfast today. Pt reports not liking the lunch that was brought to her - daughter to call down and order something she will like. Explained that daughter can order meals in advanced.  Daughter also reports that pt has been eating well PTA.  Reports some weight changes. Per Epic, pt has lost ~12 lbs (10.2%) in the last 3 months, which is significant and severe for the time frame.  RD suspects malnutrition to some degree, but cannot definitively diagnose at this time.  RD to order Ensure BID and Magic Cup TID, as well as MVI with minerals daily.  Medications: reviewed  Labs: reviewed; Na 132 (L)  NUTRITION - FOCUSED PHYSICAL EXAM: Unable to perform - defer to in-person assessment  Diet Order:   Diet Order             Diet regular Room service appropriate?  Yes; Fluid consistency: Thin  Diet effective now                  EDUCATION NEEDS:  Education needs have been addressed  Skin:  Skin Assessment: Reviewed RN Assessment  Last BM:  07/29/21  Height:  Ht Readings from Last 1 Encounters:  07/27/21 5\' 6"  (1.676 m)   Weight:  Wt Readings from Last 1 Encounters:  07/29/21 48.5 kg   BMI:  Body mass index is 17.26 kg/m.  Estimated Nutritional Needs:  Kcal:  1500-1700 Protein:  60-75 grams Fluid:  >1.5 L  07/31/21, RD, LDN (she/her/hers) Registered Dietitian I After-Hours/Weekend Pager # in [place]

## 2021-07-29 NOTE — Evaluation (Signed)
Physical Therapy Evaluation Patient Details Name: Stephanie Reyes MRN: [id number] DOB: [birth year] Today's Date: 07/29/2021  History of Present Illness  87 year old female presented on 9/21 to [facility] ED for presyncopal episode. Pt found to be tachycardic to 136, and hypertensive . Left hip x-ray negative for acute fracture. Brain MRI showed a few small acute infarct but no evidence of hemorrhage. Admitted for treatement of A-fib with RVR , smaill chronic right cerebellar infarcts. PMH: hypertension as well as recent hypotension, syncope, PACs, diastolic heart failure, frequent falls status post left hip fracture and repair 2019,  Clinical Impression  PTA pt living alone in senior apartment with level entry. Pt reports independence in ambulation, however has noted she has less endurance, trialed [facility] but feels it gets in the way, and was using hand rail in hallway. Pt is currently limited by generalized weakness which is exacerbated when she is in Afib-RVR, as well as decreased balance and endurance. Pt is currently min guard for transfers and min A for ambulation with RW. Pt adamantly refuses SNF, states family will stay with her 24/7 if needed. PT told pt she will need 24/7 support at discharge and recommended HHPT, pt will likely decline as she states her family will help her with the exercises from last time. PT will continue to follow acutely.         Recommendations for follow up therapy are one component of a multi-disciplinary discharge planning process, led by the attending physician.  Recommendations may be updated based on patient status, additional functional criteria and insurance authorization.  Follow Up Recommendations Home health PT;Supervision/Assistance - 24 hour    Equipment Recommendations  None recommended by PT (has necessary equipment)    Recommendations for Other Services OT consult     Precautions / Restrictions Precautions Precautions: Fall Restrictions Weight Bearing  Restrictions: No      Mobility  Bed Mobility               General bed mobility comments: OOB in recliner on entry    Transfers Overall transfer level: Needs assistance Equipment used: Rolling walker (2 wheeled) Transfers: Sit to/from Stand Sit to Stand: Min guard         General transfer comment: min guard for power up and steadying at RW, vc for hand placement  Ambulation/Gait Ambulation/Gait assistance: Min assist Gait Distance (Feet): 10 Feet Assistive device: Rolling walker (2 wheeled) Gait Pattern/deviations: Step-through pattern;Narrow base of support;Trunk flexed Gait velocity: slowed Gait velocity interpretation: <1.31 ft/sec, indicative of household ambulator General Gait Details: minA for steadying as pt weaker with distance, self aware of increasing weakness, vc for upright posture, proximity to RW and wider BoS         Balance Overall balance assessment: Needs assistance Sitting-balance support: No upper extremity supported;Feet supported;Feet unsupported Sitting balance-Leahy Scale: Normal     Standing balance support: No upper extremity supported;During functional activity;Bilateral upper extremity supported;Single extremity supported Standing balance-Leahy Scale: Fair Standing balance comment: requires UE support for dynamic activities                             Pertinent Vitals/Pain Pain Assessment: No/denies pain    Home Living Family/patient expects to be discharged to:: Private residence Living Arrangements: Alone Available Help at Discharge: Family;Available 24 hours/day Type of Home: Apartment Home Access: Level entry     Home Layout: One level Home Equipment: Walker - 4 wheels;Cane - single point;Tub bench;Hand held  shower head      Prior Function Level of Independence: Independent         Comments: walks in hallway with use of handrail, takes bird baths, does cooking and general cleaning, family does  shopping, deep cleaning, transportation        Extremity/Trunk Assessment   Upper Extremity Assessment Upper Extremity Assessment: Defer to OT evaluation    Lower Extremity Assessment Lower Extremity Assessment: RLE deficits/detail;LLE deficits/detail RLE Deficits / Details: ROM WFL, strength grossly 4/5 RLE Sensation: WNL RLE Coordination: WNL LLE Deficits / Details: ROM WFL, strength grossly 4/5 LLE Sensation: WNL LLE Coordination: WNL    Cervical / Trunk Assessment Cervical / Trunk Assessment: Kyphotic  Communication   Communication: HOH  Cognition Arousal/Alertness: Awake/alert Behavior During Therapy: WFL for tasks assessed/performed Overall Cognitive Status: Within Functional Limits for tasks assessed                                        General Comments General comments (skin integrity, edema, etc.): HR in 80s at rest 130 max noted with ambulation, at same time pt reports feeling weaker        Assessment/Plan    PT Assessment Patient needs continued PT services  PT Problem List Decreased strength;Decreased activity tolerance;Decreased balance;Decreased mobility;Cardiopulmonary status limiting activity       PT Treatment Interventions Gait training;DME instruction;Functional mobility training;Therapeutic activities;Therapeutic exercise;Balance training;Cognitive remediation;Patient/family education    PT Goals (Current goals can be found in the Care Plan section)  Acute Rehab PT Goals Patient Stated Goal: get home PT Goal Formulation: With patient Time For Goal Achievement: 08/12/21 Potential to Achieve Goals: Fair    Frequency Min 3X/week    AM-PAC PT "6 Clicks" Mobility  Outcome Measure Help needed turning from your back to your side while in a flat bed without using bedrails?: None Help needed moving from lying on your back to sitting on the side of a flat bed without using bedrails?: None Help needed moving to and from a bed to a  chair (including a wheelchair)?: A Little Help needed standing up from a chair using your arms (e.g., wheelchair or bedside chair)?: A Little Help needed to walk in hospital room?: A Little Help needed climbing 3-5 steps with a railing? : A Lot 6 Click Score: 19    End of Session Equipment Utilized During Treatment: Gait belt Activity Tolerance: Treatment limited secondary to medical complications (Comment) (A-fib) Patient left: in chair;with call bell/phone within reach;with chair alarm set Nurse Communication: Mobility status PT Visit Diagnosis: Unsteadiness on feet (R26.81);History of falling (Z91.81);Repeated falls (R29.6);Muscle weakness (generalized) (M62.81);Difficulty in walking, not elsewhere classified (R26.2)    Time: 7893-8101 PT Time Calculation (min) (ACUTE ONLY): 27 min   Charges:   PT Evaluation $PT Eval Moderate Complexity: 1 Mod PT Treatments $Gait Training: 8-22 mins        Shabria Egley B. Beverely Risen PT, DPT Acute Rehabilitation Services Pager [phone] Office [phone]   Elon Alas [facility] 07/29/2021, 10:31 AM

## 2021-07-29 NOTE — Progress Notes (Signed)
DAILY PROGRESS NOTE   Patient Name: Stephanie Reyes Date of Encounter: 07/29/2021 Cardiologist: Chrystie Nose, MD  Chief Complaint   No complaints  Patient Profile   Stephanie Reyes is a 87 y.o. female with a history of syncope, PACs, and hypertension who is being seen for the evaluation of atrial fibrillation with RVR at the request of Dr. Adela Lank.  Subjective   No issues overnight - switched to Eliquis and amiodarone last evening -  HR control is better, but on IV diltiazem. She wants to go home.  Objective   Vitals:   07/28/21 2016 07/29/21 0220 07/29/21 0351 07/29/21 0814  BP: 125/89  (!) 144/99 (!) 147/90  Pulse: 74  72 95  Resp: 19  17 18   Temp: 97.7 F (36.5 C)  (!) 97.5 F (36.4 C)   TempSrc: Oral  Oral   SpO2: 98%  98% 99%  Weight:  48.5 kg    Height:        Intake/Output Summary (Last 24 hours) at 07/29/2021 07/31/2021 Last data filed at 07/29/2021 07/31/2021 Gross per 24 hour  Intake 1291.2 ml  Output 1200 ml  Net 91.2 ml   Filed Weights   07/27/21 1228 07/29/21 0220  Weight: 54 kg 48.5 kg    Physical Exam   General appearance: alert, cachectic, and no distress Neck: no carotid bruit, no JVD, and thyroid not enlarged, symmetric, no tenderness/mass/nodules Lungs: clear to auscultation bilaterally Heart: irregularly irregular rhythm Abdomen: soft, non-tender; bowel sounds normal; no masses,  no organomegaly Extremities: extremities normal, atraumatic, no cyanosis or edema Pulses: 2+ and symmetric Skin: Pale, cool, dry Neurologic: Grossly normal Psych: Pleasant  Inpatient Medications    Scheduled Meds:   stroke: mapping our early stages of recovery book   Does not apply Once   amiodarone  200 mg Oral BID   apixaban  2.5 mg Oral BID   atorvastatin  10 mg Oral Daily   Tdap  0.5 mL Intramuscular Once    Continuous Infusions:  diltiazem (CARDIZEM) infusion 5 mg/hr (07/29/21 0306)    PRN Meds: acetaminophen **OR** acetaminophen   Labs    Results for orders placed or performed during the hospital encounter of 07/27/21 (from the past 48 hour(s))  CBC WITH DIFFERENTIAL     Status: None   Collection Time: 07/27/21 12:29 PM  Result Value Ref Range   WBC 5.1 4.0 - 10.5 K/uL   RBC 4.19 3.87 - 5.11 MIL/uL   Hemoglobin 13.7 12.0 - 15.0 g/dL   HCT 07/29/21 09.6 - 28.3 %   MCV 98.8 80.0 - 100.0 fL   MCH 32.7 26.0 - 34.0 pg   MCHC 33.1 30.0 - 36.0 g/dL   RDW 66.2 94.7 - 65.4 %   Platelets 208 150 - 400 K/uL   nRBC 0.0 0.0 - 0.2 %   Neutrophils Relative % 65 %   Neutro Abs 3.3 1.7 - 7.7 K/uL   Lymphocytes Relative 23 %   Lymphs Abs 1.2 0.7 - 4.0 K/uL   Monocytes Relative 9 %   Monocytes Absolute 0.5 0.1 - 1.0 K/uL   Eosinophils Relative 2 %   Eosinophils Absolute 0.1 0.0 - 0.5 K/uL   Basophils Relative 1 %   Basophils Absolute 0.0 0.0 - 0.1 K/uL   Immature Granulocytes 0 %   Abs Immature Granulocytes 0.02 0.00 - 0.07 K/uL    Comment: Performed at [facility], [address], [place], [place]  Comprehensive metabolic  panel     Status: Abnormal   Collection Time: 07/27/21 12:29 PM  Result Value Ref Range   Sodium 135 135 - 145 mmol/L   Potassium 3.9 3.5 - 5.1 mmol/L   Chloride 102 98 - 111 mmol/L   CO2 20 (L) 22 - 32 mmol/L   Glucose, Bld 144 (H) 70 - 99 mg/dL    Comment: Glucose reference range applies only to samples taken after fasting for at least 8 hours.   BUN 13 8 - 23 mg/dL   Creatinine, Ser 0.92 (H) 0.44 - 1.00 mg/dL   Calcium 9.3 8.9 - 33.0 mg/dL   Total Protein 6.7 6.5 - 8.1 g/dL   Albumin 4.0 3.5 - 5.0 g/dL   AST 34 15 - 41 U/L   ALT 18 0 - 44 U/L   Alkaline Phosphatase 51 38 - 126 U/L   Total Bilirubin 1.3 (H) 0.3 - 1.2 mg/dL   GFR, Estimated 44 (L) >60 mL/min    Comment: (NOTE) Calculated using the CKD-EPI Creatinine Equation (2021)    Anion gap 13 5 - 15    Comment: Performed at [facility], [address], [place], [place] [address]  Protime-INR     Status: None    Collection Time: 07/27/21 12:29 PM  Result Value Ref Range   Prothrombin Time 14.8 11.4 - 15.2 seconds   INR 1.2 0.8 - 1.2    Comment: (NOTE) INR goal varies based on device and disease states. Performed at [facility], [address], [place], [place] [address]   APTT     Status: None   Collection Time: 07/27/21 12:29 PM  Result Value Ref Range   aPTT 27 24 - 36 seconds    Comment: Performed at [facility], [address] [place], [place] [address]  Magnesium     Status: None   Collection Time: 07/27/21 12:29 PM  Result Value Ref Range   Magnesium 2.0 1.7 - 2.4 mg/dL    Comment: Performed at [facility], [address] [place], [place] [address]  Troponin I (High Sensitivity)     Status: None   Collection Time: 07/27/21 12:29 PM  Result Value Ref Range   Troponin I (High Sensitivity) 7 <18 ng/L    Comment: (NOTE) Elevated high sensitivity troponin I (hsTnI) values and significant  changes across serial measurements may suggest ACS but many other  chronic and acute conditions are known to elevate hsTnI results.  Refer to the "Links" section for chest pain algorithms and additional  guidance. Performed at [facility], [address], [place], [place] [address]   SARS CORONAVIRUS 2 (TAT 6-24 HRS) Nasopharyngeal Nasopharyngeal Swab     Status: None   Collection Time: 07/27/21 12:29 PM   Specimen: Nasopharyngeal Swab  Result Value Ref Range   SARS Coronavirus 2 NEGATIVE NEGATIVE    Comment: (NOTE) SARS-CoV-2 target nucleic acids are NOT DETECTED.  The SARS-CoV-2 RNA is generally detectable in upper and lower respiratory specimens during the acute phase of infection. Negative results do not preclude SARS-CoV-2 infection, do not rule out co-infections with other pathogens, and should not be used as the sole basis for treatment or other patient management decisions. Negative results must be combined with clinical observations, patient history, and  epidemiological information. The expected result is Negative.  Fact Sheet for Patients: HairSlick.no  Fact Sheet for Healthcare Providers: quierodirigir.com  This test is not yet approved or cleared by  the [employer] and  has been authorized for detection and/or diagnosis of SARS-CoV-2 by FDA under an Emergency Use Authorization (EUA). This EUA will remain  in effect (meaning this test can be used) for the duration of the COVID-19 declaration under Se ction 564(b)(1) of the Act, 21 U.S.C. section 360bbb-3(b)(1), unless the authorization is terminated or revoked sooner.  Performed at [facility], [address], [place], [place] [address]   CBG monitoring, ED     Status: Abnormal   Collection Time: 07/27/21 12:32 PM  Result Value Ref Range   Glucose-Capillary 133 (H) 70 - 99 mg/dL    Comment: Glucose reference range applies only to samples taken after fasting for at least 8 hours.  Urinalysis, Complete w Microscopic Urine, Clean Catch     Status: Abnormal   Collection Time: 07/27/21  1:00 PM  Result Value Ref Range   Color, Urine YELLOW YELLOW   APPearance CLEAR CLEAR   Specific Gravity, Urine <1.005 (L) 1.005 - 1.030   pH 7.0 5.0 - 8.0   Glucose, UA NEGATIVE NEGATIVE mg/dL   Hgb urine dipstick TRACE (A) NEGATIVE   Bilirubin Urine NEGATIVE NEGATIVE   Ketones, ur NEGATIVE NEGATIVE mg/dL   Protein, ur NEGATIVE NEGATIVE mg/dL   Nitrite NEGATIVE NEGATIVE   Leukocytes,Ua TRACE (A) NEGATIVE   Squamous Epithelial / LPF NONE SEEN 0 - 5   WBC, UA 0-5 0 - 5 WBC/hpf   RBC / HPF 0-5 0 - 5 RBC/hpf   Bacteria, UA RARE (A) NONE SEEN    Comment: Performed at [facility], [address], [place], [place] [address]  TSH     Status: None   Collection Time: 07/27/21  4:40 PM  Result Value Ref Range   TSH 3.306 0.350 - 4.500 uIU/mL    Comment: Performed by a 3rd Generation assay with a functional sensitivity of <=0.01  uIU/mL. Performed at [facility], [address] [place], [place] [address]   Heparin level (unfractionated)     Status: None   Collection Time: 07/28/21  4:30 AM  Result Value Ref Range   Heparin Unfractionated 0.41 0.30 - 0.70 IU/mL    Comment: (NOTE) The clinical reportable range upper limit is being lowered to >1.10 to align with the FDA approved guidance for the current laboratory assay.  If heparin results are below expected values, and patient dosage has  been confirmed, suggest follow up testing of antithrombin III levels. Performed at [facility], [address] [place], [place] [address]   Comprehensive metabolic panel     Status: Abnormal   Collection Time: 07/28/21  4:30 AM  Result Value Ref Range   Sodium 136 135 - 145 mmol/L   Potassium 3.1 (L) 3.5 - 5.1 mmol/L   Chloride 102 98 - 111 mmol/L   CO2 23 22 - 32 mmol/L   Glucose, Bld 97 70 - 99 mg/dL    Comment: Glucose reference range applies only to samples taken after fasting for at least 8 hours.   BUN 10 8 - 23 mg/dL   Creatinine, Ser 2.95 0.44 - 1.00 mg/dL   Calcium 9.2 8.9 - 62.1 mg/dL   Total Protein 6.2 (L) 6.5 - 8.1 g/dL   Albumin 3.5 3.5 - 5.0 g/dL   AST 24 15 - 41 U/L   ALT 15 0 - 44 U/L   Alkaline Phosphatase 48 38 - 126 U/L   Total Bilirubin 1.1 0.3 -  1.2 mg/dL   GFR, Estimated 57 (L) >60 mL/min    Comment: (NOTE) Calculated using the CKD-EPI Creatinine Equation (2021)    Anion gap 11 5 - 15    Comment: Performed at [facility], [address], [place], [place] [address]  CBC     Status: None   Collection Time: 07/28/21  4:30 AM  Result Value Ref Range   WBC 5.5 4.0 - 10.5 K/uL   RBC 3.95 3.87 - 5.11 MIL/uL   Hemoglobin 13.1 12.0 - 15.0 g/dL   HCT 98.1 19.1 - 47.8 %   MCV 96.7 80.0 - 100.0 fL   MCH 33.2 26.0 - 34.0 pg   MCHC 34.3 30.0 - 36.0 g/dL   RDW 29.5 62.1 - 30.8 %   Platelets 191 150 - 400 K/uL   nRBC 0.0 0.0 - 0.2 %    Comment: Performed at [facility], [address] [place], [place] [address]  Lipid panel     Status: None   Collection Time: 07/28/21  4:30 AM  Result Value Ref Range   Cholesterol 141 0 - 200 mg/dL   Triglycerides 44 <696 mg/dL   HDL 59 >29 mg/dL   Total CHOL/HDL Ratio 2.4 RATIO   VLDL 9 0 - 40 mg/dL   LDL Cholesterol 73 0 - 99 mg/dL    Comment:        Total Cholesterol/HDL:CHD Risk Coronary Heart Disease Risk Table                     Men   Women  1/2 Average Risk   3.4   3.3  Average Risk       5.0   4.4  2 X Average Risk   9.6   7.1  3 X Average Risk  23.4   11.0        Use the calculated Patient Ratio above and the CHD Risk Table to determine the patient's CHD Risk.        ATP III CLASSIFICATION (LDL):  <100     mg/dL   Optimal  528-413  mg/dL   Near or Above                    Optimal  130-159  mg/dL   Borderline  244-010  mg/dL   High  >272     mg/dL   Very High Performed at [facility], [address], [place], [place] [address]   Hemoglobin A1c     Status: Abnormal   Collection Time: 07/28/21  4:30 AM  Result Value Ref Range   Hgb A1c MFr Bld 5.9 (H) 4.8 - 5.6 %    Comment: (NOTE)         Prediabetes: 5.7 - 6.4         Diabetes: >6.4         Glycemic control for adults with diabetes: <7.0    Mean Plasma Glucose 123 mg/dL    Comment: (NOTE) Performed At: [facility] [address] [place], [place] [id number] Jolene Schimke MD DG:[id number]   Heparin level (unfractionated)     Status: None   Collection Time: 07/28/21 11:12 AM  Result Value Ref Range   Heparin Unfractionated 0.35 0.30 - 0.70 IU/mL    Comment: (NOTE) The clinical reportable range upper limit is being lowered to >1.10 to align with the FDA approved guidance for the current laboratory assay.  If heparin  results are below expected values, and patient dosage has  been confirmed, suggest follow up testing of antithrombin III levels. Performed at [facility], [address], [place],  [place] [address]   CBC     Status: None   Collection Time: 07/29/21  1:57 AM  Result Value Ref Range   WBC 5.3 4.0 - 10.5 K/uL   RBC 3.91 3.87 - 5.11 MIL/uL   Hemoglobin 12.5 12.0 - 15.0 g/dL   HCT 60.4 54.0 - 98.1 %   MCV 94.6 80.0 - 100.0 fL   MCH 32.0 26.0 - 34.0 pg   MCHC 33.8 30.0 - 36.0 g/dL   RDW 19.1 47.8 - 29.5 %   Platelets 205 150 - 400 K/uL   nRBC 0.0 0.0 - 0.2 %    Comment: Performed at [facility], [address] [place], [place] [address]  Basic metabolic panel     Status: Abnormal   Collection Time: 07/29/21  1:57 AM  Result Value Ref Range   Sodium 132 (L) 135 - 145 mmol/L   Potassium 4.1 3.5 - 5.1 mmol/L   Chloride 101 98 - 111 mmol/L   CO2 22 22 - 32 mmol/L   Glucose, Bld 93 70 - 99 mg/dL    Comment: Glucose reference range applies only to samples taken after fasting for at least 8 hours.   BUN 8 8 - 23 mg/dL   Creatinine, Ser 8.65 0.44 - 1.00 mg/dL   Calcium 9.2 8.9 - 78.4 mg/dL   GFR, Estimated >69 >62 mL/min    Comment: (NOTE) Calculated using the CKD-EPI Creatinine Equation (2021)    Anion gap 9 5 - 15    Comment: Performed at [facility], [address], [place], [place] [address]    ECG   N/A  Telemetry   A. fib with controlled ventricular response- Personally Reviewed  Radiology    CT ANGIO HEAD NECK W WO CM  Result Date: 07/27/2021 CLINICAL DATA:  Follow-up examination for stroke. EXAM: CT ANGIOGRAPHY HEAD AND NECK TECHNIQUE: Multidetector CT imaging of the head and neck was performed using the standard protocol during bolus administration of intravenous contrast. Multiplanar CT image reconstructions and MIPs were obtained to evaluate the vascular anatomy. Carotid stenosis measurements (when applicable) are obtained utilizing NASCET criteria, using the distal internal carotid diameter as the denominator. CONTRAST:  60mL OMNIPAQUE IOHEXOL 350 MG/ML SOLN COMPARISON:  MRI from earlier the same day. FINDINGS: CT HEAD FINDINGS Brain:  Age-related cerebral atrophy with mild chronic small vessel ischemic disease. Previously identified small ischemic infarcts not well visualized by CT. No other acute large vessel territory infarct. No intracranial hemorrhage. No mass lesion or midline shift. No hydrocephalus or extra-axial fluid collection. Vascular: No hyperdense vessel. Skull: Scalp soft tissues and calvarium within normal limits. Sinuses: Paranasal sinuses and mastoid air cells are largely clear. Orbits: Globes and orbital soft tissues demonstrate no acute finding. Senescent calcifications noted. Review of the MIP images confirms the above findings CTA NECK FINDINGS Aortic arch: Visualized aortic arch normal in caliber with normal branch pattern. Mild-to-moderate atheromatous change about the arch. No visible stenosis about the origin of the great vessels. Right carotid system: Right common and internal carotid arteries tortuous and partially medialized into the retropharyngeal space, but are widely patent without stenosis, dissection or occlusion. Left carotid system: Left common and internal carotid arteries tortuous but are widely patent without stenosis, dissection or occlusion. Vertebral arteries: Both vertebral arteries arise from  the subclavian arteries. No significant proximal subclavian artery stenosis. Strongly dominant right vertebral artery with hypoplastic left vertebral artery. Vertebral arteries patent without stenosis, dissection or occlusion. Skeleton: Osteopenia noted. No discrete or worrisome osseous lesions. Multilevel facet arthrosis noted throughout the cervical spine. Spondylosis at C6-7 with mild-to-moderate spinal stenosis. Patient is edentulous. Other neck: No other acute soft tissue abnormality within the neck. No mass or adenopathy. Upper chest: Pleuroparenchymal scarring noted at the left lung apex. Visualized upper chest demonstrates no other acute finding. Review of the MIP images confirms the above findings CTA  HEAD FINDINGS Anterior circulation: Petrous segments patent bilaterally. Mild for age atheromatous change within the carotid siphons without significant stenosis. A1 segments patent bilaterally. Left A1 hypoplastic, accounting for the diminutive left ICA is compared to the right. Normal anterior communicating artery complex. Anterior cerebral arteries patent to their distal aspects without stenosis. No M1 stenosis or occlusion. Normal MCA bifurcations. Distal MCA branches well perfused and symmetric. Posterior circulation: Dominant right vertebral artery widely patent to the vertebrobasilar junction. Right PICA patent at its origin. Hypoplastic left vertebral artery largely terminates in PICA. Left PICA patent as well. Basilar widely patent to its distal aspect without stenosis. Superior cerebellar arteries patent bilaterally. Both PCAs primarily supplied via the basilar well perfused to their distal aspects. Venous sinuses: Grossly patent allowing for timing the contrast bolus. Anatomic variants: Hypoplastic left A1 segment. Hypoplastic left vertebral artery terminates in PICA. No aneurysm. Review of the MIP images confirms the above findings IMPRESSION: 1. Negative CTA for large vessel occlusion. 2. Mild for age atheromatous change about the major arterial vasculature of the head and neck as above. No hemodynamically significant or correctable stenosis. 3. Diffuse tortuosity of the major arterial vasculature of the head and neck, suggesting chronic underlying hypertension. Electronically Signed   By: Rise Mu M.D.   On: 07/27/2021 21:30   DG Chest 2 View  Result Date: 07/27/2021 CLINICAL DATA:  Syncope EXAM: CHEST - 2 VIEW COMPARISON:  Chest x-ray dated June 06, 2006 FINDINGS: Cardiomegaly, new compared to 2007 prior. Trace left pleural effusion. Moderate hiatal hernia. Scattered bilateral linear opacities, likely due to atelectasis or scarring. No focal consolidation. No evidence of pneumothorax.  IMPRESSION: Cardiomegaly, new compared to 2007 prior. Trace left pleural effusion. Scattered bilateral linear opacities, likely due to atelectasis or scarring. No focal consolidation. Moderate hiatal hernia. Electronically Signed   By: Allegra Lai M.D.   On: 07/27/2021 13:45   MR BRAIN WO CONTRAST  Result Date: 07/27/2021 CLINICAL DATA:  Neuro deficit, acute, stroke suspected EXAM: MRI HEAD WITHOUT CONTRAST TECHNIQUE: Multiplanar, multiecho pulse sequences of the brain and surrounding structures were obtained without intravenous contrast. COMPARISON:  None. FINDINGS: Brain: Foci of reduced diffusion are present in the right corona radiata, left frontal cortex and left parietotemporal junction. No evidence of hemorrhage. There is no intracranial mass or mass effect. There is no hydrocephalus or extra-axial fluid collection. Patchy foci of T2 hyperintensity in the supratentorial white are nonspecific but may reflect mild chronic microvascular ischemic changes. Small chronic right cerebellar infarcts. Vascular: Major vessel flow voids at the skull base are preserved. Skull and upper cervical spine: Normal marrow signal is preserved. Sinuses/Orbits: Paranasal sinuses are aerated. Orbits are unremarkable. Other: Sella is unremarkable.  Mastoid air cells are clear. IMPRESSION: Few small acute infarcts as described.  No evidence of hemorrhage. Chronic microvascular ischemic changes. Small chronic right cerebellar infarcts. Electronically Signed   By: Guadlupe Spanish M.D.   On: 07/27/2021  15:50   ECHOCARDIOGRAM COMPLETE  Result Date: 07/28/2021    ECHOCARDIOGRAM REPORT   Patient Name:   DEZZIE BADILLA Date of Exam: 07/28/2021 Medical Rec #:  [id number]         Height:       66.0 in Accession #:    [id number]        Weight:       119.0 lb Date of Birth:  1924/01/13         BSA:          1.604 m Patient Age:    89 years          BP:           124/79 mmHg Patient Gender: F                 HR:           84 bpm.  Exam Location:  Inpatient Procedure: 2D Echo, Intracardiac Opacification Agent, Color Doppler and Cardiac            Doppler Indications:    Stroke I63.9  History:        Patient has prior history of Echocardiogram examinations, most                 recent 03/19/2020. Pulmonary HTN; Signs/Symptoms:Dyspnea.  Sonographer:    Roosvelt Maser RDCS Referring Phys: [id number] [facility] IMPRESSIONS  1. Left ventricular ejection fraction, by estimation, is 55 to 60%. The left ventricle has normal function. The left ventricle has no regional wall motion abnormalities. Left ventricular diastolic function could not be evaluated.  2. Right ventricular systolic function is normal. The right ventricular size is normal. There is moderately elevated pulmonary artery systolic pressure.  3. Left atrial size was moderately dilated.  4. Right atrial size was moderately dilated.  5. The mitral valve is normal in structure. Mild mitral valve regurgitation. No evidence of mitral stenosis.  6. Tricuspid valve regurgitation is severe.  7. The aortic valve is tricuspid. Aortic valve regurgitation is mild. Mild to moderate aortic valve sclerosis/calcification is present, without any evidence of aortic stenosis.  8. Aortic dilatation noted. There is mild dilatation of the aortic root, measuring 44 mm.  9. The inferior vena cava is dilated in size with >50% respiratory variability, suggesting right atrial pressure of 8 mmHg. FINDINGS  Left Ventricle: Left ventricular ejection fraction, by estimation, is 55 to 60%. The left ventricle has normal function. The left ventricle has no regional wall motion abnormalities. Definity contrast agent was given IV to delineate the left ventricular  endocardial borders. The left ventricular internal cavity size was normal in size. There is no left ventricular hypertrophy. Left ventricular diastolic function could not be evaluated due to atrial fibrillation. Left ventricular diastolic function could  not be  evaluated. Right Ventricle: The right ventricular size is normal. Right ventricular systolic function is normal. There is moderately elevated pulmonary artery systolic pressure. The tricuspid regurgitant velocity is 3.12 m/s, and with an assumed right atrial pressure of 8 mmHg, the estimated right ventricular systolic pressure is 46.9 mmHg. Left Atrium: Left atrial size was moderately dilated. Right Atrium: Right atrial size was moderately dilated. Pericardium: There is no evidence of pericardial effusion. Mitral Valve: The mitral valve is normal in structure. Mild mitral valve regurgitation. No evidence of mitral valve stenosis. Tricuspid Valve: The tricuspid valve is normal in structure. Tricuspid valve regurgitation is severe. No evidence of tricuspid stenosis. Aortic Valve: The aortic valve  is tricuspid. Aortic valve regurgitation is mild. Aortic regurgitation PHT measures 319 msec. Mild to moderate aortic valve sclerosis/calcification is present, without any evidence of aortic stenosis. Aortic valve mean gradient measures 2.0 mmHg. Aortic valve peak gradient measures 4.3 mmHg. Aortic valve area, by VTI measures 2.54 cm. Pulmonic Valve: The pulmonic valve was grossly normal. Pulmonic valve regurgitation is trivial. No evidence of pulmonic stenosis. Aorta: Aortic dilatation noted. There is mild dilatation of the aortic root, measuring 44 mm. Venous: The inferior vena cava is dilated in size with greater than 50% respiratory variability, suggesting right atrial pressure of 8 mmHg. IAS/Shunts: No atrial level shunt detected by color flow Doppler.  LEFT VENTRICLE PLAX 2D LVIDd:         4.30 cm LVIDs:         3.60 cm LV PW:         1.10 cm LV IVS:        0.90 cm LVOT diam:     2.20 cm LV SV:         47 LV SV Index:   29 LVOT Area:     3.80 cm  RIGHT VENTRICLE             IVC RV Basal diam:  3.30 cm     IVC diam: 2.30 cm RV S prime:     10.70 cm/s TAPSE (M-mode): 1.0 cm LEFT ATRIUM           Index       RIGHT ATRIUM            Index LA diam:      3.20 cm 1.99 cm/m  RA Area:     20.30 cm LA Vol (A4C): 68.4 ml 42.64 ml/m RA Volume:   63.00 ml  39.27 ml/m  AORTIC VALVE AV Area (Vmax):    2.77 cm AV Area (Vmean):   2.64 cm AV Area (VTI):     2.54 cm AV Vmax:           104.00 cm/s AV Vmean:          73.100 cm/s AV VTI:            0.184 m AV Peak Grad:      4.3 mmHg AV Mean Grad:      2.0 mmHg LVOT Vmax:         75.70 cm/s LVOT Vmean:        50.700 cm/s LVOT VTI:          0.123 m LVOT/AV VTI ratio: 0.67 AI PHT:            319 msec  AORTA Ao Root diam: 4.40 cm TRICUSPID VALVE TR Peak grad:   38.9 mmHg TR Vmax:        312.00 cm/s  SHUNTS Systemic VTI:  0.12 m Systemic Diam: 2.20 cm Olga Millers MD Electronically signed by Olga Millers MD Signature Date/Time: 07/28/2021/2:33:44 PM    Final    DG Hip Unilat W or Wo Pelvis 2-3 Views Left  Result Date: 07/27/2021 CLINICAL DATA:  Syncope and fall with left hip pain. EXAM: DG HIP (WITH OR WITHOUT PELVIS) 2-3V LEFT COMPARISON:  02/07/2018 FINDINGS: The patient has had previous bipolar hip replacement on the left. Artifact overlies the left hemipelvis but I do not see an acute pelvic or hip region fracture. IMPRESSION: Overlying artifact. No acute fracture seen. Previous bipolar left hip replacement. Electronically Signed   By: Paulina Fusi M.D.   On:  07/27/2021 13:42    Cardiac Studies   Echo pending  Assessment   Principal Problem:   Atrial fibrillation with RVR (HCC) Active Problems:   Near syncope   CVA (cerebral vascular accident) (HCC)   Plan   Feels well today and wants to go home.  I think she can be at home - was supposed to have a PT eval today. Will transition cardizem from IV to PO today, continue amiodarone 200 mg BID for 2 weeks, then 200 mg daily- we will arrange follow-up and likely plan for outpatient DCCV if she does not covert to sinus in about 1 month. She cannot miss doses of her Eliquis.  No further cardiac suggestions at this time.  [facility] will sign off.   Medication Recommendations:  Amiodarone 200 mg BID x 2 weeks, then 200 mg daily, cardizem LA 120 mg daily, Eliquis 2.5 mg BID, atorvastatin 10 mg QHS Other recommendations (labs, testing, etc):  DO NOT restart midodrine Follow up as an outpatient:  We will arrange APP follow-up and outpatient DCCV   Time Spent Directly with Patient:  I have spent a total of 25 minutes with the patient reviewing hospital notes, telemetry, EKGs, labs and examining the patient as well as establishing an assessment and plan that was discussed personally with the patient.  > 50% of time was spent in direct patient care.  Length of Stay:  LOS: 1 day   Chrystie Nose, MD, [facility], FACP  [place]  [facility]  Medical Director of the Advanced Lipid Disorders &  Cardiovascular Risk Reduction Clinic Diplomate of the American Board of Clinical Lipidology Attending Cardiologist  Direct Dial: [phone]  Fax: [phone]  Website:  [email or web address] Nicely Shametra Cumberland 07/29/2021, 9:26 AM

## 2021-07-29 NOTE — TOC Benefit Eligibility Note (Signed)
Patient Product/process development scientist completed.    The patient is currently admitted and upon discharge could be taking Eliquis 5 mg.  The current 30 day co-pay is, $4.00.   The patient is insured through [employer] Part D     Roland Earl, CPhT Pharmacy Patient Advocate Specialist [facility] Antimicrobial Stewardship Team Direct Number: [phone]  Fax: [phone]

## 2021-07-29 NOTE — Progress Notes (Signed)
Cardiology follow up has been arranged. Plan for DCCV if she remains in Afib. If DCCV, plan for the week of Oct 24th with Dr. Rennis Golden.   Marcelino Duster, PA-C 07/29/2021, 10:15 AM [phone] [facility] [address] [place], [place] [address]

## 2021-07-29 NOTE — TOC Transition Note (Signed)
Transition of Care [facility]) - CM/SW Discharge Note   Patient Details  Name: Stephanie Reyes MRN: [id number] Date of Birth: 04-11-1924  [facility]) CM/SW Contact:  Leone Haven, RN Phone Number: 07/29/2021, 1:36 PM   Clinical Narrative:    Patient is for dc today, NCM offered choice, daughter chose [facility], NCM made referral to [facility] with [facility], he is able to take referral , soc will begin 24 to 48 hrs post dc.  Pateint has a rollator , cane at home.    Final next level of care: Home w Home Health Services Barriers to Discharge: No Barriers Identified   Patient Goals and CMS Choice Patient states their goals for this hospitalization and ongoing recovery are:: retun home CMS Medicare.gov Compare Post Acute Care list provided to:: Patient Represenative (must comment) Choice offered to / list presented to : Adult Children  Discharge Placement                       Discharge Plan and Services                  DME Agency: NA       HH Arranged: PT, OT HH Agency: [facility] Date [facility] Agency Contacted: 07/29/21 Time HH Agency Contacted: 1336 Representative spoke with at [facility] Agency: Kandee Keen  Social Determinants of Health (SDOH) Interventions     Readmission Risk Interventions No flowsheet data found.

## 2021-07-29 NOTE — Discharge Instructions (Addendum)
You were hospitalized for new diagnosis of atrial fibrillation and small strokes. Thank you for allowing [place] to be part of your care.   Please call for follow up Neurology in 1 month. Phone number provided.  Please follow up with your cardiology office on 10/4 at 2:45pm.  Please note these changes made to your medications:   Please START taking:  Amiodarone (heart rhythm) Take 1 tablet two times daily (AM/PM) for 14 days. Then take 1 tablet once daily until you follow up with your cardiologist. Carvedilol (heart rate) Eliquis (blood thinner)  Please STOP taking:  Midodrine

## 2021-08-02 ENCOUNTER — Telehealth: Payer: Self-pay | Admitting: Internal Medicine

## 2021-08-02 DIAGNOSIS — I959 Hypotension, unspecified: Secondary | ICD-10-CM | POA: Diagnosis not present

## 2021-08-02 DIAGNOSIS — M4802 Spinal stenosis, cervical region: Secondary | ICD-10-CM | POA: Diagnosis not present

## 2021-08-02 DIAGNOSIS — I110 Hypertensive heart disease with heart failure: Secondary | ICD-10-CM | POA: Diagnosis not present

## 2021-08-02 DIAGNOSIS — I088 Other rheumatic multiple valve diseases: Secondary | ICD-10-CM | POA: Diagnosis not present

## 2021-08-02 DIAGNOSIS — M47812 Spondylosis without myelopathy or radiculopathy, cervical region: Secondary | ICD-10-CM | POA: Diagnosis not present

## 2021-08-02 DIAGNOSIS — I69398 Other sequelae of cerebral infarction: Secondary | ICD-10-CM | POA: Diagnosis not present

## 2021-08-02 DIAGNOSIS — I5042 Chronic combined systolic (congestive) and diastolic (congestive) heart failure: Secondary | ICD-10-CM | POA: Diagnosis not present

## 2021-08-02 DIAGNOSIS — Z9181 History of falling: Secondary | ICD-10-CM | POA: Diagnosis not present

## 2021-08-02 DIAGNOSIS — Z7901 Long term (current) use of anticoagulants: Secondary | ICD-10-CM | POA: Diagnosis not present

## 2021-08-02 DIAGNOSIS — Z96642 Presence of left artificial hip joint: Secondary | ICD-10-CM | POA: Diagnosis not present

## 2021-08-02 DIAGNOSIS — I480 Paroxysmal atrial fibrillation: Secondary | ICD-10-CM | POA: Diagnosis not present

## 2021-08-02 DIAGNOSIS — K449 Diaphragmatic hernia without obstruction or gangrene: Secondary | ICD-10-CM | POA: Diagnosis not present

## 2021-08-02 DIAGNOSIS — H9193 Unspecified hearing loss, bilateral: Secondary | ICD-10-CM | POA: Diagnosis not present

## 2021-08-02 DIAGNOSIS — M8580 Other specified disorders of bone density and structure, unspecified site: Secondary | ICD-10-CM | POA: Diagnosis not present

## 2021-08-02 DIAGNOSIS — R2689 Other abnormalities of gait and mobility: Secondary | ICD-10-CM | POA: Diagnosis not present

## 2021-08-02 NOTE — Telephone Encounter (Signed)
Pt c/o medication issue:  1. Name of Medication: apixaban (ELIQUIS) 2.5 MG TABS tablet  2. How are you currently taking this medication (dosage and times per day)? Take 1 tablet (2.5 mg total) by mouth 2 (two) times daily.  3. Are you having a reaction (difficulty breathing--STAT)? Possibly   4. What is your medication issue? Pt states that while straining to have a bowel movement pt had a nose bleed.. thinks that this may be a possible side effect of medication but wanted to be sure... please advise

## 2021-08-02 NOTE — Telephone Encounter (Signed)
I spoke with patients daughter. Relayed the message below written by Aundra Millet. Per Pt daughter, Patient is afraid that is she is going to "bleed to death" She asked for some advice about what to do if she gets another nose bleed. Bleeding did stop in about 10 min today. Advised she tilt her head forward, apply pressure to bridge of the nose. Can pack with tissue. Or try spraying afrin up the nose. If those don't work after about an hour then seek medical attention. Daughter appreciative of the call and will reassure her mom she is on the right dose and medication.

## 2021-08-02 NOTE — Telephone Encounter (Signed)
Lmom pt daughter to please call us back

## 2021-08-02 NOTE — Telephone Encounter (Signed)
Bleeding can be a side effect of Eliquis. She is taking the correct dose based on her age and weight - indication is afib and also has history of stroke. Would confirm that her nose bleed has resolved, can pack with tissue if needed, may have been brought on by straining. She should monitor for any future episodes of bleeding.

## 2021-08-03 DIAGNOSIS — H9193 Unspecified hearing loss, bilateral: Secondary | ICD-10-CM | POA: Diagnosis not present

## 2021-08-03 DIAGNOSIS — Z9181 History of falling: Secondary | ICD-10-CM | POA: Diagnosis not present

## 2021-08-03 DIAGNOSIS — M4802 Spinal stenosis, cervical region: Secondary | ICD-10-CM | POA: Diagnosis not present

## 2021-08-03 DIAGNOSIS — I959 Hypotension, unspecified: Secondary | ICD-10-CM | POA: Diagnosis not present

## 2021-08-03 DIAGNOSIS — I088 Other rheumatic multiple valve diseases: Secondary | ICD-10-CM | POA: Diagnosis not present

## 2021-08-03 DIAGNOSIS — Z7901 Long term (current) use of anticoagulants: Secondary | ICD-10-CM | POA: Diagnosis not present

## 2021-08-03 DIAGNOSIS — R2689 Other abnormalities of gait and mobility: Secondary | ICD-10-CM | POA: Diagnosis not present

## 2021-08-03 DIAGNOSIS — I69398 Other sequelae of cerebral infarction: Secondary | ICD-10-CM | POA: Diagnosis not present

## 2021-08-03 DIAGNOSIS — Z96642 Presence of left artificial hip joint: Secondary | ICD-10-CM | POA: Diagnosis not present

## 2021-08-03 DIAGNOSIS — I480 Paroxysmal atrial fibrillation: Secondary | ICD-10-CM | POA: Diagnosis not present

## 2021-08-03 DIAGNOSIS — M8580 Other specified disorders of bone density and structure, unspecified site: Secondary | ICD-10-CM | POA: Diagnosis not present

## 2021-08-03 DIAGNOSIS — M47812 Spondylosis without myelopathy or radiculopathy, cervical region: Secondary | ICD-10-CM | POA: Diagnosis not present

## 2021-08-03 DIAGNOSIS — K449 Diaphragmatic hernia without obstruction or gangrene: Secondary | ICD-10-CM | POA: Diagnosis not present

## 2021-08-03 DIAGNOSIS — I5042 Chronic combined systolic (congestive) and diastolic (congestive) heart failure: Secondary | ICD-10-CM | POA: Diagnosis not present

## 2021-08-03 DIAGNOSIS — I110 Hypertensive heart disease with heart failure: Secondary | ICD-10-CM | POA: Diagnosis not present

## 2021-08-08 DIAGNOSIS — E7800 Pure hypercholesterolemia, unspecified: Secondary | ICD-10-CM | POA: Diagnosis not present

## 2021-08-08 DIAGNOSIS — Z0000 Encounter for general adult medical examination without abnormal findings: Secondary | ICD-10-CM | POA: Diagnosis not present

## 2021-08-08 DIAGNOSIS — R29898 Other symptoms and signs involving the musculoskeletal system: Secondary | ICD-10-CM | POA: Diagnosis not present

## 2021-08-08 DIAGNOSIS — I4891 Unspecified atrial fibrillation: Secondary | ICD-10-CM | POA: Diagnosis not present

## 2021-08-08 DIAGNOSIS — K5909 Other constipation: Secondary | ICD-10-CM | POA: Diagnosis not present

## 2021-08-08 DIAGNOSIS — E039 Hypothyroidism, unspecified: Secondary | ICD-10-CM | POA: Diagnosis not present

## 2021-08-08 DIAGNOSIS — I10 Essential (primary) hypertension: Secondary | ICD-10-CM | POA: Diagnosis not present

## 2021-08-08 NOTE — Progress Notes (Signed)
Cardiology Office Note:    Date:  08/09/2021   ID:  Stephanie Reyes, DOB 16-Dec-1923, MRN [id number]  PCP:  Shon Hale, MD  Cardiologist:  Chrystie Nose, MD   Referring MD: Shon Hale, *   Chief Complaint  Patient presents with   Hospitalization Follow-up    Afib    History of Present Illness:    Stephanie Reyes is a 87 y.o. female with a hx of syncope, PACs, HTN, and new Afb RVR.  She was initially seen in the hospital in April 2018 for syncopal episode.  Telemetry showed frequent ectopy but no atrial fibrillation.  Echocardiogram at that time was unremarkable with exception of grade 1 diastolic dysfunction.  Outpatient monitor showed underlying sinus rhythm with PACs, no A. Fib.  She has struggled with orthostatic hypotension and was started on midodrine as needed.  She was recently hospitalized on 07/27/2021 for another episode of near syncope and weakness.  She was found to be in fib RVR with heart rates in the 100-1 50 range.  His of IV Cardizem did not improve her heart rate.  On arrival she was hypertensive and tachycardic.  She is rate controlled with diltiazem.  Echocardiogram was performed and revealed LVEF of 55 to 60%, normal RV function, moderate biatrial enlargement, mild MR and aortic root dilation of 44 mm.  Neurology was consulted for possible stroke with imaging suggestive of small acute infarcts as well as chronic microvascular changes.  Given this, she was anticoagulated with Eliquis 2.5 mg twice daily.  TEE guided cardioversion was considered, however due to scheduling this was not performed during her hospitalization.  Plan for discharge with close follow-up.  If still in A. fib, we will proceed with cardioversion. She was loaded with amiodarone and discharged on 200 mg ami BID x 2 weeks and cardizem 120 mg daily. Midodrine was held.   She presents today for hospital follow-up. Daughters present during appointment. EKG shows Afib with HR 93. No  syncope, occasional fluttering in chest, no SOB. She is not volume up on exam. She feels tired and is sleeping a lot.    Past Medical History:  Diagnosis Date   Essential hypertension    Premature atrial contractions    Syncope    a. 02/2017 Echo: EF 55-60%, mild LVH, no rwma, Gr1 DD, triv AI, mildly dil Ao root, mild MR, mod TR, PASP .    Past Surgical History:  Procedure Laterality Date   ANTERIOR APPROACH HEMI HIP ARTHROPLASTY Left 02/09/2018   Procedure: ANTERIOR APPROACH HEMI HIP ARTHROPLASTY;  Surgeon: Samson Frederic, MD;  Location: [facility] OR;  Service: Orthopedics;  Laterality: Left;   HERNIA REPAIR     TONSILLECTOMY      Current Medications: Current Meds  Medication Sig   amiodarone (PACERONE) 200 MG tablet Take 1 tablet (200 mg total) by mouth 2 (two) times daily for 14 days, THEN 1 tablet (200 mg total) daily for 16 days.   apixaban (ELIQUIS) 2.5 MG TABS tablet Take 1 tablet (2.5 mg total) by mouth 2 (two) times daily.   atorvastatin (LIPITOR) 10 MG tablet Take 1 tablet (10 mg total) by mouth at bedtime.   diltiazem (CARDIZEM CD) 120 MG 24 hr capsule Take 1 capsule (120 mg total) by mouth daily.   furosemide (LASIX) 20 MG tablet TAKE 1 TABLET BY MOUTH EVERY DAY (Patient taking differently: Take 20 mg by mouth daily.)   Multiple Vitamin (MULTIVITAMIN WITH MINERALS) TABS tablet Take 1  tablet by mouth every other day.     Allergies:   Fluvirin  [influenza virus vaccine], Aspirin, Clindamycin/lincomycin, Hydrocodone, Penicillins, Ampicillin, and Eggs or egg-derived products   Social History   Socioeconomic History   Marital status: Divorced    Spouse name: Not on file   Number of children: Not on file   Years of education: Not on file   Highest education level: Not on file  Occupational History   Not on file  Tobacco Use   Smoking status: Never   Smokeless tobacco: Never  Vaping Use   Vaping Use: Never used  Substance and Sexual Activity   Alcohol use: No   Drug  use: No   Sexual activity: Not on file  Other Topics Concern   Not on file  Social History Narrative   Not on file   Social Determinants of Health   Financial Resource Strain: Not on file  Food Insecurity: Not on file  Transportation Needs: Not on file  Physical Activity: Not on file  Stress: Not on file  Social Connections: Not on file     Family History: The patient's family history includes Hypertension in her mother.  ROS:   Please see the history of present illness.     All other systems reviewed and are negative.  EKGs/Labs/Other Studies Reviewed:    The following studies were reviewed today:  Echo 07/28/21:  1. Left ventricular ejection fraction, by estimation, is 55 to 60%. The  left ventricle has normal function. The left ventricle has no regional  wall motion abnormalities. Left ventricular diastolic function could not  be evaluated.   2. Right ventricular systolic function is normal. The right ventricular  size is normal. There is moderately elevated pulmonary artery systolic  pressure.   3. Left atrial size was moderately dilated.   4. Right atrial size was moderately dilated.   5. The mitral valve is normal in structure. Mild mitral valve  regurgitation. No evidence of mitral stenosis.   6. Tricuspid valve regurgitation is severe.   7. The aortic valve is tricuspid. Aortic valve regurgitation is mild.  Mild to moderate aortic valve sclerosis/calcification is present, without  any evidence of aortic stenosis.   8. Aortic dilatation noted. There is mild dilatation of the aortic root,  measuring 44 mm.   9. The inferior vena cava is dilated in size with >50% respiratory  variability, suggesting right atrial pressure of 8 mmHg.   EKG:  EKG is  ordered today.  The ekg ordered today demonstrates atrial fibrillation with ventricular rate 93  Recent Labs: 07/27/2021: Magnesium 2.0; TSH 3.306 07/28/2021: ALT 15 07/29/2021: BUN 8; Creatinine, Ser 0.81; Hemoglobin  12.5; Platelets 205; Potassium 4.1; Sodium 132  Recent Lipid Panel    Component Value Date/Time   CHOL 141 07/28/2021 0430   TRIG 44 07/28/2021 0430   HDL 59 07/28/2021 0430   CHOLHDL 2.4 07/28/2021 0430   VLDL 9 07/28/2021 0430   LDLCALC 73 07/28/2021 0430    Physical Exam:    VS:  BP 126/80 (BP Location: Left Arm, Patient Position: Sitting, Cuff Size: Normal)   Pulse 93   Ht 5' 5" (1.651 m)   Wt 109 lb 6.4 oz (49.6 kg)   SpO2 98%   BMI 18.21 kg/m     Wt Readings from Last 3 Encounters:  08/09/21 109 lb 6.4 oz (49.6 kg)  07/29/21 106 lb 14.8 oz (48.5 kg)  04/28/21 119 lb (54 kg)     GEN:    Well nourished, well developed in no acute distress HEENT: Normal NECK: No JVD; No carotid bruits LYMPHATICS: No lymphadenopathy CARDIAC: irregular rhythm, regular rate, no murmurs, rubs, gallops RESPIRATORY:  Clear to auscultation without rales, wheezing or rhonchi  ABDOMEN: Soft, non-tender, non-distended MUSCULOSKELETAL:  No edema; No deformity  SKIN: Warm and dry NEUROLOGIC:  Alert and oriented x 3 PSYCHIATRIC:  Normal affect   ASSESSMENT:    1. Atrial fibrillation with RVR (HCC)   2. Chronic anticoagulation   3. Benign essential HTN   4. Orthostatic hypotension   5. Near syncope    PLAN:    In order of problems listed above:  Atrial fibrillation-chronicity unknown Chronic anticoagulation - no bleeding problems on eliquis - one minor nosebleed, I suggested having afirin on hand - appropriately dosed eliquis at 2.5 mg BID - complete 200 mg BID amiodarone x 14 days, then 200 mg daily - continue cardizem 120 mg daily - EKG today with atrial fibrillation We discussed cardioversion and she is willing to proceed. Daughters were present during appointment. All questions answered.   Reduce amiodarone to 200 mg on 08/13/21.    Hypertension Orthostatic hypotension Syncope - maintained on 120 mg diltiazem - no recent syncope - BP log given to establish baseline  Follow  up as scheduled.    Medication Adjustments/Labs and Tests Ordered: Current medicines are reviewed at length with the patient today.  Concerns regarding medicines are outlined above.  Orders Placed This Encounter  Procedures   Basic metabolic panel   CBC   EKG 12-Lead    No orders of the defined types were placed in this encounter.   Signed, Malayasia Mirkin Nicole Amiree No, PA  08/09/2021 3:48 PM    [facility]  

## 2021-08-08 NOTE — H&P (View-Only) (Signed)
Cardiology Office Note:    Date:  08/09/2021   ID:  Stephanie Reyes, DOB 16-Dec-1923, MRN [id number]  PCP:  Shon Hale, MD  Cardiologist:  Chrystie Nose, MD   Referring MD: Shon Hale, *   Chief Complaint  Patient presents with   Hospitalization Follow-up    Afib    History of Present Illness:    Stephanie Reyes is a 87 y.o. female with a hx of syncope, PACs, HTN, and new Afb RVR.  She was initially seen in the hospital in April 2018 for syncopal episode.  Telemetry showed frequent ectopy but no atrial fibrillation.  Echocardiogram at that time was unremarkable with exception of grade 1 diastolic dysfunction.  Outpatient monitor showed underlying sinus rhythm with PACs, no A. Fib.  She has struggled with orthostatic hypotension and was started on midodrine as needed.  She was recently hospitalized on 07/27/2021 for another episode of near syncope and weakness.  She was found to be in fib RVR with heart rates in the 100-1 50 range.  His of IV Cardizem did not improve her heart rate.  On arrival she was hypertensive and tachycardic.  She is rate controlled with diltiazem.  Echocardiogram was performed and revealed LVEF of 55 to 60%, normal RV function, moderate biatrial enlargement, mild MR and aortic root dilation of 44 mm.  Neurology was consulted for possible stroke with imaging suggestive of small acute infarcts as well as chronic microvascular changes.  Given this, she was anticoagulated with Eliquis 2.5 mg twice daily.  TEE guided cardioversion was considered, however due to scheduling this was not performed during her hospitalization.  Plan for discharge with close follow-up.  If still in A. fib, we will proceed with cardioversion. She was loaded with amiodarone and discharged on 200 mg ami BID x 2 weeks and cardizem 120 mg daily. Midodrine was held.   She presents today for hospital follow-up. Daughters present during appointment. EKG shows Afib with HR 93. No  syncope, occasional fluttering in chest, no SOB. She is not volume up on exam. She feels tired and is sleeping a lot.    Past Medical History:  Diagnosis Date   Essential hypertension    Premature atrial contractions    Syncope    a. 02/2017 Echo: EF 55-60%, mild LVH, no rwma, Gr1 DD, triv AI, mildly dil Ao root, mild MR, mod TR, PASP .    Past Surgical History:  Procedure Laterality Date   ANTERIOR APPROACH HEMI HIP ARTHROPLASTY Left 02/09/2018   Procedure: ANTERIOR APPROACH HEMI HIP ARTHROPLASTY;  Surgeon: Samson Frederic, MD;  Location: [facility] OR;  Service: Orthopedics;  Laterality: Left;   HERNIA REPAIR     TONSILLECTOMY      Current Medications: Current Meds  Medication Sig   amiodarone (PACERONE) 200 MG tablet Take 1 tablet (200 mg total) by mouth 2 (two) times daily for 14 days, THEN 1 tablet (200 mg total) daily for 16 days.   apixaban (ELIQUIS) 2.5 MG TABS tablet Take 1 tablet (2.5 mg total) by mouth 2 (two) times daily.   atorvastatin (LIPITOR) 10 MG tablet Take 1 tablet (10 mg total) by mouth at bedtime.   diltiazem (CARDIZEM CD) 120 MG 24 hr capsule Take 1 capsule (120 mg total) by mouth daily.   furosemide (LASIX) 20 MG tablet TAKE 1 TABLET BY MOUTH EVERY DAY (Patient taking differently: Take 20 mg by mouth daily.)   Multiple Vitamin (MULTIVITAMIN WITH MINERALS) TABS tablet Take 1  tablet by mouth every other day.     Allergies:   Fluvirin  [influenza virus vaccine], Aspirin, Clindamycin/lincomycin, Hydrocodone, Penicillins, Ampicillin, and Eggs or egg-derived products   Social History   Socioeconomic History   Marital status: Divorced    Spouse name: Not on file   Number of children: Not on file   Years of education: Not on file   Highest education level: Not on file  Occupational History   Not on file  Tobacco Use   Smoking status: Never   Smokeless tobacco: Never  Vaping Use   Vaping Use: Never used  Substance and Sexual Activity   Alcohol use: No   Drug  use: No   Sexual activity: Not on file  Other Topics Concern   Not on file  Social History Narrative   Not on file   Social Determinants of Health   Financial Resource Strain: Not on file  Food Insecurity: Not on file  Transportation Needs: Not on file  Physical Activity: Not on file  Stress: Not on file  Social Connections: Not on file     Family History: The patient's family history includes Hypertension in her mother.  ROS:   Please see the history of present illness.     All other systems reviewed and are negative.  EKGs/Labs/Other Studies Reviewed:    The following studies were reviewed today:  Echo 07/28/21:  1. Left ventricular ejection fraction, by estimation, is 55 to 60%. The  left ventricle has normal function. The left ventricle has no regional  wall motion abnormalities. Left ventricular diastolic function could not  be evaluated.   2. Right ventricular systolic function is normal. The right ventricular  size is normal. There is moderately elevated pulmonary artery systolic  pressure.   3. Left atrial size was moderately dilated.   4. Right atrial size was moderately dilated.   5. The mitral valve is normal in structure. Mild mitral valve  regurgitation. No evidence of mitral stenosis.   6. Tricuspid valve regurgitation is severe.   7. The aortic valve is tricuspid. Aortic valve regurgitation is mild.  Mild to moderate aortic valve sclerosis/calcification is present, without  any evidence of aortic stenosis.   8. Aortic dilatation noted. There is mild dilatation of the aortic root,  measuring 44 mm.   9. The inferior vena cava is dilated in size with >50% respiratory  variability, suggesting right atrial pressure of 8 mmHg.   EKG:  EKG is  ordered today.  The ekg ordered today demonstrates atrial fibrillation with ventricular rate 93  Recent Labs: 07/27/2021: Magnesium 2.0; TSH 3.306 07/28/2021: ALT 15 07/29/2021: BUN 8; Creatinine, Ser 0.81; Hemoglobin  12.5; Platelets 205; Potassium 4.1; Sodium 132  Recent Lipid Panel    Component Value Date/Time   CHOL 141 07/28/2021 0430   TRIG 44 07/28/2021 0430   HDL 59 07/28/2021 0430   CHOLHDL 2.4 07/28/2021 0430   VLDL 9 07/28/2021 0430   LDLCALC 73 07/28/2021 0430    Physical Exam:    VS:  BP 126/80 (BP Location: Left Arm, Patient Position: Sitting, Cuff Size: Normal)   Pulse 93   Ht 5\' 5"  (1.651 m)   Wt 109 lb 6.4 oz (49.6 kg)   SpO2 98%   BMI 18.21 kg/m     Wt Readings from Last 3 Encounters:  08/09/21 109 lb 6.4 oz (49.6 kg)  07/29/21 106 lb 14.8 oz (48.5 kg)  04/28/21 119 lb (54 kg)     GEN:  Well nourished, well developed in no acute distress HEENT: Normal NECK: No JVD; No carotid bruits LYMPHATICS: No lymphadenopathy CARDIAC: irregular rhythm, regular rate, no murmurs, rubs, gallops RESPIRATORY:  Clear to auscultation without rales, wheezing or rhonchi  ABDOMEN: Soft, non-tender, non-distended MUSCULOSKELETAL:  No edema; No deformity  SKIN: Warm and dry NEUROLOGIC:  Alert and oriented x 3 PSYCHIATRIC:  Normal affect   ASSESSMENT:    1. Atrial fibrillation with RVR (HCC)   2. Chronic anticoagulation   3. Benign essential HTN   4. Orthostatic hypotension   5. Near syncope    PLAN:    In order of problems listed above:  Atrial fibrillation-chronicity unknown Chronic anticoagulation - no bleeding problems on eliquis - one minor nosebleed, I suggested having afirin on hand - appropriately dosed eliquis at 2.5 mg BID - complete 200 mg BID amiodarone x 14 days, then 200 mg daily - continue cardizem 120 mg daily - EKG today with atrial fibrillation We discussed cardioversion and she is willing to proceed. Daughters were present during appointment. All questions answered.   Reduce amiodarone to 200 mg on 08/13/21.    Hypertension Orthostatic hypotension Syncope - maintained on 120 mg diltiazem - no recent syncope - BP log given to establish baseline  Follow  up as scheduled.    Medication Adjustments/Labs and Tests Ordered: Current medicines are reviewed at length with the patient today.  Concerns regarding medicines are outlined above.  Orders Placed This Encounter  Procedures   Basic metabolic panel   CBC   EKG 12-Lead    No orders of the defined types were placed in this encounter.   Signed, Marcelino Duster, PA  08/09/2021 3:48 PM    [facility]

## 2021-08-09 ENCOUNTER — Other Ambulatory Visit: Payer: Self-pay

## 2021-08-09 ENCOUNTER — Encounter: Payer: Self-pay | Admitting: Physician Assistant

## 2021-08-09 ENCOUNTER — Ambulatory Visit (INDEPENDENT_AMBULATORY_CARE_PROVIDER_SITE_OTHER): Payer: Medicare Other | Admitting: Physician Assistant

## 2021-08-09 VITALS — BP 126/80 | HR 93 | Ht 65.0 in | Wt 109.4 lb

## 2021-08-09 DIAGNOSIS — R55 Syncope and collapse: Secondary | ICD-10-CM | POA: Diagnosis not present

## 2021-08-09 DIAGNOSIS — I4891 Unspecified atrial fibrillation: Secondary | ICD-10-CM | POA: Diagnosis not present

## 2021-08-09 DIAGNOSIS — I951 Orthostatic hypotension: Secondary | ICD-10-CM

## 2021-08-09 DIAGNOSIS — I10 Essential (primary) hypertension: Secondary | ICD-10-CM

## 2021-08-09 DIAGNOSIS — Z7901 Long term (current) use of anticoagulants: Secondary | ICD-10-CM | POA: Diagnosis not present

## 2021-08-09 NOTE — Patient Instructions (Addendum)
You are scheduled for a Cardioversion on Monday, October 24th with Dr. Rennis Golden.  Please arrive at the [facility] (Main Entrance A) at [facility]: [address] [place], [place] [address] at 6:30 AM.   DIET: Nothing to eat or drink after midnight except a sip of water with medications (see medication instructions below)  FYI: For your safety, and to allow [place] to monitor your vital signs accurately during the surgery/procedure we request that   if you have artificial nails, gel coating, SNS etc. Please have those removed prior to your surgery/procedure. Not having the nail coverings /polish removed may result in cancellation or delay of your surgery/procedure.   Medication Instructions: Hold furosemide (Lasix) AM of procedure  Continue your anticoagulant: Eliquis You will need to continue your anticoagulant after your procedure until you are told by your provider that it is safe to stop   Labs: Please return for labs 1 week prior to procedure (BMET, CBC)  Our in office lab hours are Monday-Friday 8:00-4:00, closed for lunch 12:45-1:45 pm.  No appointment needed.  You must have a responsible person to drive you home and stay in the waiting area during your procedure. Failure to do so could result in cancellation.  Bring your insurance cards.  *Special Note: Every effort is made to have your procedure done on time. Occasionally there are emergencies that occur at the hospital that may cause delays. Please be patient if a delay does occur.     OTHER INSTRUCTIONS:  Medication Instructions:  Starting 10/8 (Saturday)-decrease amiodarone to 200 mg daily  Follow-Up: At [facility], you and your health needs are our priority.  As part of our continuing mission to provide you with exceptional heart care, we have created designated Provider Care Teams.  These Care Teams include your primary Cardiologist (physician) and Advanced Practice Providers (APPs -  Physician Assistants and Nurse  Practitioners) who all work together to provide you with the care you need, when you need it.  We recommend signing up for the patient portal called "MyChart".  Sign up information is provided on this After Visit Summary.  MyChart is used to connect with patients for Virtual Visits (Telemedicine).  Patients are able to view lab/test results, encounter notes, upcoming appointments, etc.  Non-urgent messages can be sent to your provider as well.   To learn more about what you can do with MyChart, go to [email or web address].    Your next appointment:   11/17 with Micah Flesher PA  Other Instructions Please keep blood pressure and heart rate log daily-check this 2 hours after your morning medications

## 2021-08-10 ENCOUNTER — Other Ambulatory Visit: Payer: Self-pay | Admitting: Pharmacist

## 2021-08-10 DIAGNOSIS — M47812 Spondylosis without myelopathy or radiculopathy, cervical region: Secondary | ICD-10-CM | POA: Diagnosis not present

## 2021-08-10 DIAGNOSIS — Z9181 History of falling: Secondary | ICD-10-CM | POA: Diagnosis not present

## 2021-08-10 DIAGNOSIS — I480 Paroxysmal atrial fibrillation: Secondary | ICD-10-CM | POA: Diagnosis not present

## 2021-08-10 DIAGNOSIS — R2689 Other abnormalities of gait and mobility: Secondary | ICD-10-CM | POA: Diagnosis not present

## 2021-08-10 DIAGNOSIS — I088 Other rheumatic multiple valve diseases: Secondary | ICD-10-CM | POA: Diagnosis not present

## 2021-08-10 DIAGNOSIS — I5042 Chronic combined systolic (congestive) and diastolic (congestive) heart failure: Secondary | ICD-10-CM | POA: Diagnosis not present

## 2021-08-10 DIAGNOSIS — I110 Hypertensive heart disease with heart failure: Secondary | ICD-10-CM | POA: Diagnosis not present

## 2021-08-10 DIAGNOSIS — M8580 Other specified disorders of bone density and structure, unspecified site: Secondary | ICD-10-CM | POA: Diagnosis not present

## 2021-08-10 DIAGNOSIS — Z7901 Long term (current) use of anticoagulants: Secondary | ICD-10-CM | POA: Diagnosis not present

## 2021-08-10 DIAGNOSIS — I959 Hypotension, unspecified: Secondary | ICD-10-CM | POA: Diagnosis not present

## 2021-08-10 DIAGNOSIS — Z96642 Presence of left artificial hip joint: Secondary | ICD-10-CM | POA: Diagnosis not present

## 2021-08-10 DIAGNOSIS — I69398 Other sequelae of cerebral infarction: Secondary | ICD-10-CM | POA: Diagnosis not present

## 2021-08-10 DIAGNOSIS — H9193 Unspecified hearing loss, bilateral: Secondary | ICD-10-CM | POA: Diagnosis not present

## 2021-08-10 DIAGNOSIS — M4802 Spinal stenosis, cervical region: Secondary | ICD-10-CM | POA: Diagnosis not present

## 2021-08-10 DIAGNOSIS — K449 Diaphragmatic hernia without obstruction or gangrene: Secondary | ICD-10-CM | POA: Diagnosis not present

## 2021-08-10 MED ORDER — ATORVASTATIN CALCIUM 10 MG PO TABS: 10.0000 mg | ORAL_TABLET | Freq: Every day | ORAL | 5 refills | Status: DC

## 2021-08-10 MED ORDER — APIXABAN 2.5 MG PO TABS: 2.5000 mg | ORAL_TABLET | Freq: Two times a day (BID) | ORAL | 5 refills | Status: DC

## 2021-08-10 MED ORDER — AMIODARONE HCL 200 MG PO TABS: 200.0000 mg | ORAL_TABLET | Freq: Every day | ORAL | 1 refills | Status: DC

## 2021-08-10 MED ORDER — DILTIAZEM HCL ER COATED BEADS 120 MG PO CP24
120.0000 mg | ORAL_CAPSULE | Freq: Every day | ORAL | 5 refills | Status: DC
Start: 2021-08-10 — End: 2021-12-26

## 2021-08-10 NOTE — Progress Notes (Signed)
Pt only discharged with 30 day rx of cardiac meds which were not refilled at office visit yesterday. Will send in refill for Eliquis, diltiazem, atorvastatin, and amiodarone.

## 2021-08-12 DIAGNOSIS — M4802 Spinal stenosis, cervical region: Secondary | ICD-10-CM | POA: Diagnosis not present

## 2021-08-12 DIAGNOSIS — I5042 Chronic combined systolic (congestive) and diastolic (congestive) heart failure: Secondary | ICD-10-CM | POA: Diagnosis not present

## 2021-08-12 DIAGNOSIS — I110 Hypertensive heart disease with heart failure: Secondary | ICD-10-CM | POA: Diagnosis not present

## 2021-08-12 DIAGNOSIS — H9193 Unspecified hearing loss, bilateral: Secondary | ICD-10-CM | POA: Diagnosis not present

## 2021-08-12 DIAGNOSIS — I959 Hypotension, unspecified: Secondary | ICD-10-CM | POA: Diagnosis not present

## 2021-08-12 DIAGNOSIS — I088 Other rheumatic multiple valve diseases: Secondary | ICD-10-CM | POA: Diagnosis not present

## 2021-08-12 DIAGNOSIS — M47812 Spondylosis without myelopathy or radiculopathy, cervical region: Secondary | ICD-10-CM | POA: Diagnosis not present

## 2021-08-12 DIAGNOSIS — Z9181 History of falling: Secondary | ICD-10-CM | POA: Diagnosis not present

## 2021-08-12 DIAGNOSIS — Z7901 Long term (current) use of anticoagulants: Secondary | ICD-10-CM | POA: Diagnosis not present

## 2021-08-12 DIAGNOSIS — I69398 Other sequelae of cerebral infarction: Secondary | ICD-10-CM | POA: Diagnosis not present

## 2021-08-12 DIAGNOSIS — Z96642 Presence of left artificial hip joint: Secondary | ICD-10-CM | POA: Diagnosis not present

## 2021-08-12 DIAGNOSIS — M8580 Other specified disorders of bone density and structure, unspecified site: Secondary | ICD-10-CM | POA: Diagnosis not present

## 2021-08-12 DIAGNOSIS — I480 Paroxysmal atrial fibrillation: Secondary | ICD-10-CM | POA: Diagnosis not present

## 2021-08-12 DIAGNOSIS — K449 Diaphragmatic hernia without obstruction or gangrene: Secondary | ICD-10-CM | POA: Diagnosis not present

## 2021-08-12 DIAGNOSIS — R2689 Other abnormalities of gait and mobility: Secondary | ICD-10-CM | POA: Diagnosis not present

## 2021-08-15 ENCOUNTER — Other Ambulatory Visit (HOSPITAL_COMMUNITY): Payer: Self-pay

## 2021-08-15 DIAGNOSIS — I088 Other rheumatic multiple valve diseases: Secondary | ICD-10-CM | POA: Diagnosis not present

## 2021-08-15 DIAGNOSIS — R2689 Other abnormalities of gait and mobility: Secondary | ICD-10-CM | POA: Diagnosis not present

## 2021-08-15 DIAGNOSIS — H9193 Unspecified hearing loss, bilateral: Secondary | ICD-10-CM | POA: Diagnosis not present

## 2021-08-15 DIAGNOSIS — Z9181 History of falling: Secondary | ICD-10-CM | POA: Diagnosis not present

## 2021-08-15 DIAGNOSIS — I110 Hypertensive heart disease with heart failure: Secondary | ICD-10-CM | POA: Diagnosis not present

## 2021-08-15 DIAGNOSIS — M4802 Spinal stenosis, cervical region: Secondary | ICD-10-CM | POA: Diagnosis not present

## 2021-08-15 DIAGNOSIS — Z96642 Presence of left artificial hip joint: Secondary | ICD-10-CM | POA: Diagnosis not present

## 2021-08-15 DIAGNOSIS — K449 Diaphragmatic hernia without obstruction or gangrene: Secondary | ICD-10-CM | POA: Diagnosis not present

## 2021-08-15 DIAGNOSIS — I959 Hypotension, unspecified: Secondary | ICD-10-CM | POA: Diagnosis not present

## 2021-08-15 DIAGNOSIS — M8580 Other specified disorders of bone density and structure, unspecified site: Secondary | ICD-10-CM | POA: Diagnosis not present

## 2021-08-15 DIAGNOSIS — I5042 Chronic combined systolic (congestive) and diastolic (congestive) heart failure: Secondary | ICD-10-CM | POA: Diagnosis not present

## 2021-08-15 DIAGNOSIS — I69398 Other sequelae of cerebral infarction: Secondary | ICD-10-CM | POA: Diagnosis not present

## 2021-08-15 DIAGNOSIS — M47812 Spondylosis without myelopathy or radiculopathy, cervical region: Secondary | ICD-10-CM | POA: Diagnosis not present

## 2021-08-15 DIAGNOSIS — I480 Paroxysmal atrial fibrillation: Secondary | ICD-10-CM | POA: Diagnosis not present

## 2021-08-15 DIAGNOSIS — Z7901 Long term (current) use of anticoagulants: Secondary | ICD-10-CM | POA: Diagnosis not present

## 2021-08-17 DIAGNOSIS — I5042 Chronic combined systolic (congestive) and diastolic (congestive) heart failure: Secondary | ICD-10-CM | POA: Diagnosis not present

## 2021-08-17 DIAGNOSIS — I480 Paroxysmal atrial fibrillation: Secondary | ICD-10-CM | POA: Diagnosis not present

## 2021-08-17 DIAGNOSIS — I088 Other rheumatic multiple valve diseases: Secondary | ICD-10-CM | POA: Diagnosis not present

## 2021-08-17 DIAGNOSIS — R2689 Other abnormalities of gait and mobility: Secondary | ICD-10-CM | POA: Diagnosis not present

## 2021-08-17 DIAGNOSIS — Z96642 Presence of left artificial hip joint: Secondary | ICD-10-CM | POA: Diagnosis not present

## 2021-08-17 DIAGNOSIS — I110 Hypertensive heart disease with heart failure: Secondary | ICD-10-CM | POA: Diagnosis not present

## 2021-08-17 DIAGNOSIS — I69398 Other sequelae of cerebral infarction: Secondary | ICD-10-CM | POA: Diagnosis not present

## 2021-08-17 DIAGNOSIS — M47812 Spondylosis without myelopathy or radiculopathy, cervical region: Secondary | ICD-10-CM | POA: Diagnosis not present

## 2021-08-17 DIAGNOSIS — K449 Diaphragmatic hernia without obstruction or gangrene: Secondary | ICD-10-CM | POA: Diagnosis not present

## 2021-08-17 DIAGNOSIS — M4802 Spinal stenosis, cervical region: Secondary | ICD-10-CM | POA: Diagnosis not present

## 2021-08-17 DIAGNOSIS — I959 Hypotension, unspecified: Secondary | ICD-10-CM | POA: Diagnosis not present

## 2021-08-17 DIAGNOSIS — M8580 Other specified disorders of bone density and structure, unspecified site: Secondary | ICD-10-CM | POA: Diagnosis not present

## 2021-08-17 DIAGNOSIS — Z9181 History of falling: Secondary | ICD-10-CM | POA: Diagnosis not present

## 2021-08-17 DIAGNOSIS — H9193 Unspecified hearing loss, bilateral: Secondary | ICD-10-CM | POA: Diagnosis not present

## 2021-08-17 DIAGNOSIS — Z7901 Long term (current) use of anticoagulants: Secondary | ICD-10-CM | POA: Diagnosis not present

## 2021-08-18 ENCOUNTER — Encounter (HOSPITAL_COMMUNITY): Payer: Self-pay | Admitting: Internal Medicine

## 2021-08-18 DIAGNOSIS — M8580 Other specified disorders of bone density and structure, unspecified site: Secondary | ICD-10-CM | POA: Diagnosis not present

## 2021-08-18 DIAGNOSIS — M4802 Spinal stenosis, cervical region: Secondary | ICD-10-CM | POA: Diagnosis not present

## 2021-08-18 DIAGNOSIS — K449 Diaphragmatic hernia without obstruction or gangrene: Secondary | ICD-10-CM | POA: Diagnosis not present

## 2021-08-18 DIAGNOSIS — H9193 Unspecified hearing loss, bilateral: Secondary | ICD-10-CM | POA: Diagnosis not present

## 2021-08-18 DIAGNOSIS — I480 Paroxysmal atrial fibrillation: Secondary | ICD-10-CM | POA: Diagnosis not present

## 2021-08-18 DIAGNOSIS — I5042 Chronic combined systolic (congestive) and diastolic (congestive) heart failure: Secondary | ICD-10-CM | POA: Diagnosis not present

## 2021-08-18 DIAGNOSIS — M47812 Spondylosis without myelopathy or radiculopathy, cervical region: Secondary | ICD-10-CM | POA: Diagnosis not present

## 2021-08-18 DIAGNOSIS — I110 Hypertensive heart disease with heart failure: Secondary | ICD-10-CM | POA: Diagnosis not present

## 2021-08-18 DIAGNOSIS — I959 Hypotension, unspecified: Secondary | ICD-10-CM | POA: Diagnosis not present

## 2021-08-18 DIAGNOSIS — Z96642 Presence of left artificial hip joint: Secondary | ICD-10-CM | POA: Diagnosis not present

## 2021-08-18 DIAGNOSIS — Z9181 History of falling: Secondary | ICD-10-CM | POA: Diagnosis not present

## 2021-08-18 DIAGNOSIS — I088 Other rheumatic multiple valve diseases: Secondary | ICD-10-CM | POA: Diagnosis not present

## 2021-08-18 DIAGNOSIS — R2689 Other abnormalities of gait and mobility: Secondary | ICD-10-CM | POA: Diagnosis not present

## 2021-08-18 DIAGNOSIS — Z7901 Long term (current) use of anticoagulants: Secondary | ICD-10-CM | POA: Diagnosis not present

## 2021-08-18 DIAGNOSIS — I69398 Other sequelae of cerebral infarction: Secondary | ICD-10-CM | POA: Diagnosis not present

## 2021-08-22 DIAGNOSIS — M4802 Spinal stenosis, cervical region: Secondary | ICD-10-CM | POA: Diagnosis not present

## 2021-08-22 DIAGNOSIS — I959 Hypotension, unspecified: Secondary | ICD-10-CM | POA: Diagnosis not present

## 2021-08-22 DIAGNOSIS — Z7901 Long term (current) use of anticoagulants: Secondary | ICD-10-CM | POA: Diagnosis not present

## 2021-08-22 DIAGNOSIS — H9193 Unspecified hearing loss, bilateral: Secondary | ICD-10-CM | POA: Diagnosis not present

## 2021-08-22 DIAGNOSIS — I110 Hypertensive heart disease with heart failure: Secondary | ICD-10-CM | POA: Diagnosis not present

## 2021-08-22 DIAGNOSIS — M47812 Spondylosis without myelopathy or radiculopathy, cervical region: Secondary | ICD-10-CM | POA: Diagnosis not present

## 2021-08-22 DIAGNOSIS — I5042 Chronic combined systolic (congestive) and diastolic (congestive) heart failure: Secondary | ICD-10-CM | POA: Diagnosis not present

## 2021-08-22 DIAGNOSIS — I69398 Other sequelae of cerebral infarction: Secondary | ICD-10-CM | POA: Diagnosis not present

## 2021-08-22 DIAGNOSIS — K449 Diaphragmatic hernia without obstruction or gangrene: Secondary | ICD-10-CM | POA: Diagnosis not present

## 2021-08-22 DIAGNOSIS — I088 Other rheumatic multiple valve diseases: Secondary | ICD-10-CM | POA: Diagnosis not present

## 2021-08-22 DIAGNOSIS — Z96642 Presence of left artificial hip joint: Secondary | ICD-10-CM | POA: Diagnosis not present

## 2021-08-22 DIAGNOSIS — R2689 Other abnormalities of gait and mobility: Secondary | ICD-10-CM | POA: Diagnosis not present

## 2021-08-22 DIAGNOSIS — I480 Paroxysmal atrial fibrillation: Secondary | ICD-10-CM | POA: Diagnosis not present

## 2021-08-22 DIAGNOSIS — Z9181 History of falling: Secondary | ICD-10-CM | POA: Diagnosis not present

## 2021-08-22 DIAGNOSIS — M8580 Other specified disorders of bone density and structure, unspecified site: Secondary | ICD-10-CM | POA: Diagnosis not present

## 2021-08-23 DIAGNOSIS — I4891 Unspecified atrial fibrillation: Secondary | ICD-10-CM | POA: Diagnosis not present

## 2021-08-24 DIAGNOSIS — I959 Hypotension, unspecified: Secondary | ICD-10-CM | POA: Diagnosis not present

## 2021-08-24 DIAGNOSIS — K449 Diaphragmatic hernia without obstruction or gangrene: Secondary | ICD-10-CM | POA: Diagnosis not present

## 2021-08-24 DIAGNOSIS — M8580 Other specified disorders of bone density and structure, unspecified site: Secondary | ICD-10-CM | POA: Diagnosis not present

## 2021-08-24 DIAGNOSIS — M4802 Spinal stenosis, cervical region: Secondary | ICD-10-CM | POA: Diagnosis not present

## 2021-08-24 DIAGNOSIS — I5042 Chronic combined systolic (congestive) and diastolic (congestive) heart failure: Secondary | ICD-10-CM | POA: Diagnosis not present

## 2021-08-24 DIAGNOSIS — Z7901 Long term (current) use of anticoagulants: Secondary | ICD-10-CM | POA: Diagnosis not present

## 2021-08-24 DIAGNOSIS — I088 Other rheumatic multiple valve diseases: Secondary | ICD-10-CM | POA: Diagnosis not present

## 2021-08-24 DIAGNOSIS — H9193 Unspecified hearing loss, bilateral: Secondary | ICD-10-CM | POA: Diagnosis not present

## 2021-08-24 DIAGNOSIS — R2689 Other abnormalities of gait and mobility: Secondary | ICD-10-CM | POA: Diagnosis not present

## 2021-08-24 DIAGNOSIS — M47812 Spondylosis without myelopathy or radiculopathy, cervical region: Secondary | ICD-10-CM | POA: Diagnosis not present

## 2021-08-24 DIAGNOSIS — Z96642 Presence of left artificial hip joint: Secondary | ICD-10-CM | POA: Diagnosis not present

## 2021-08-24 DIAGNOSIS — I480 Paroxysmal atrial fibrillation: Secondary | ICD-10-CM | POA: Diagnosis not present

## 2021-08-24 DIAGNOSIS — Z9181 History of falling: Secondary | ICD-10-CM | POA: Diagnosis not present

## 2021-08-24 DIAGNOSIS — I110 Hypertensive heart disease with heart failure: Secondary | ICD-10-CM | POA: Diagnosis not present

## 2021-08-24 DIAGNOSIS — I69398 Other sequelae of cerebral infarction: Secondary | ICD-10-CM | POA: Diagnosis not present

## 2021-08-24 LAB — BASIC METABOLIC PANEL
BUN/Creatinine Ratio: 11 — ABNORMAL LOW (ref 12–28)
BUN: 12 mg/dL (ref 10–36)
CO2: 23 mmol/L (ref 20–29)
Calcium: 10.3 mg/dL (ref 8.7–10.3)
Chloride: 98 mmol/L (ref 96–106)
Creatinine, Ser: 1.06 mg/dL — ABNORMAL HIGH (ref 0.57–1.00)
Glucose: 119 mg/dL — ABNORMAL HIGH (ref 70–99)
Potassium: 4.7 mmol/L (ref 3.5–5.2)
Sodium: 140 mmol/L (ref 134–144)
eGFR: 48 mL/min/{1.73_m2} — ABNORMAL LOW (ref >59)

## 2021-08-24 LAB — CBC
Hematocrit: 43.1 % (ref 34.0–46.6)
Hemoglobin: 14.9 g/dL (ref 11.1–15.9)
MCH: 31.9 pg (ref 26.6–33.0)
MCHC: 34.6 g/dL (ref 31.5–35.7)
MCV: 92 fL (ref 79–97)
Platelets: 248 10*3/uL (ref 150–450)
RBC: 4.67 x10E6/uL (ref 3.77–5.28)
RDW: 12.1 % (ref 11.7–15.4)
WBC: 7.7 10*3/uL (ref 3.4–10.8)

## 2021-08-29 ENCOUNTER — Other Ambulatory Visit: Payer: Self-pay

## 2021-08-29 ENCOUNTER — Encounter (HOSPITAL_COMMUNITY)
Admission: RE | Disposition: A | Discharge status: Home / Self Care | Payer: Self-pay | Source: Ambulatory Visit | Attending: Internal Medicine

## 2021-08-29 ENCOUNTER — Encounter (HOSPITAL_COMMUNITY): Payer: Self-pay | Admitting: Internal Medicine

## 2021-08-29 ENCOUNTER — Ambulatory Visit (HOSPITAL_COMMUNITY): Payer: Medicare Other | Admitting: Anesthesiology

## 2021-08-29 ENCOUNTER — Ambulatory Visit (HOSPITAL_COMMUNITY)
Admission: RE | Admit: 2021-08-29 | Discharge: 2021-08-29 | Disposition: A | Discharge status: Home / Self Care | Payer: Medicare Other | Source: Ambulatory Visit | Attending: Internal Medicine | Admitting: Internal Medicine

## 2021-08-29 DIAGNOSIS — I951 Orthostatic hypotension: Secondary | ICD-10-CM | POA: Diagnosis not present

## 2021-08-29 DIAGNOSIS — N289 Disorder of kidney and ureter, unspecified: Secondary | ICD-10-CM | POA: Diagnosis not present

## 2021-08-29 DIAGNOSIS — I4819 Other persistent atrial fibrillation: Secondary | ICD-10-CM | POA: Diagnosis not present

## 2021-08-29 DIAGNOSIS — Z79899 Other long term (current) drug therapy: Secondary | ICD-10-CM | POA: Insufficient documentation

## 2021-08-29 DIAGNOSIS — I10 Essential (primary) hypertension: Secondary | ICD-10-CM | POA: Insufficient documentation

## 2021-08-29 DIAGNOSIS — Z886 Allergy status to analgesic agent status: Secondary | ICD-10-CM | POA: Diagnosis not present

## 2021-08-29 DIAGNOSIS — I4891 Unspecified atrial fibrillation: Secondary | ICD-10-CM | POA: Insufficient documentation

## 2021-08-29 DIAGNOSIS — Z7901 Long term (current) use of anticoagulants: Secondary | ICD-10-CM | POA: Insufficient documentation

## 2021-08-29 DIAGNOSIS — Z881 Allergy status to other antibiotic agents status: Secondary | ICD-10-CM | POA: Insufficient documentation

## 2021-08-29 DIAGNOSIS — E876 Hypokalemia: Secondary | ICD-10-CM | POA: Diagnosis not present

## 2021-08-29 DIAGNOSIS — Z880 Allergy status to penicillin: Secondary | ICD-10-CM | POA: Diagnosis not present

## 2021-08-29 DIAGNOSIS — Z885 Allergy status to narcotic agent status: Secondary | ICD-10-CM | POA: Insufficient documentation

## 2021-08-29 DIAGNOSIS — Z887 Allergy status to serum and vaccine status: Secondary | ICD-10-CM | POA: Diagnosis not present

## 2021-08-29 DIAGNOSIS — I480 Paroxysmal atrial fibrillation: Secondary | ICD-10-CM | POA: Diagnosis not present

## 2021-08-29 HISTORY — PX: CARDIOVERSION: SHX1299

## 2021-08-29 SURGERY — CARDIOVERSION
Anesthesia: General

## 2021-08-29 MED ORDER — LIDOCAINE 2% (20 MG/ML) 5 ML SYRINGE
INTRAMUSCULAR | Status: DC | PRN
  Administered 2021-08-29: 40 mg via INTRAVENOUS

## 2021-08-29 MED ORDER — PROPOFOL 10 MG/ML IV BOLUS
INTRAVENOUS | Status: DC | PRN
  Administered 2021-08-29: 30 mg via INTRAVENOUS
  Administered 2021-08-29: 20 mg via INTRAVENOUS

## 2021-08-29 MED ORDER — SODIUM CHLORIDE 0.9 % IV SOLN: INTRAVENOUS | Status: DC | PRN

## 2021-08-29 NOTE — CV Procedure (Signed)
   CARDIOVERSION NOTE  Procedure: Electrical Cardioversion Indications:  Atrial Fibrillation  Procedure Details:  Consent: Risks of procedure as well as the alternatives and risks of each were explained to the (patient/caregiver).  Consent for procedure obtained.  Time Out: Verified patient identification, verified procedure, site/side was marked, verified correct patient position, special equipment/implants available, medications/allergies/relevent history reviewed, required imaging and test results available.  Performed  Patient placed on cardiac monitor, pulse oximetry, supplemental oxygen as necessary.  Sedation given:  propofol per anesthesia Pacer pads placed anterior and posterior chest.  Cardioverted 1 time(s).  Cardioverted at 120J biphasic.  Impression: Findings: Post procedure EKG shows:  NSR with PAC's Complications: None Patient did tolerate procedure well.  Plan: Successful DCCV with a single 120J biphasic shock.  Time Spent Directly with the Patient:  30 minutes   Chrystie Nose, MD, [facility], FACP  [place]  [facility]  Medical Director of the [facility] Lipid [facility] &  Cardiovascular Risk Reduction Clinic Diplomate of the American Board of Clinical Lipidology Attending Cardiologist  Direct Dial: [phone]  Fax: [phone]  Website:  [email or web address] Nicely Carver Murakami 08/29/2021, 8:07 AM

## 2021-08-29 NOTE — Interval H&P Note (Signed)
History and Physical Interval Note:  08/29/2021 7:52 AM  Stephanie Reyes  has presented today for surgery, with the diagnosis of AFIB.  The various methods of treatment have been discussed with the patient and family. After consideration of risks, benefits and other options for treatment, the patient has consented to  Procedure(s): CARDIOVERSION (N/A) as a surgical intervention.  The patient's history has been reviewed, patient examined, no change in status, stable for surgery.  I have reviewed the patient's chart and labs.  Questions were answered to the patient's satisfaction.     Chrystie Nose

## 2021-08-29 NOTE — Anesthesia Preprocedure Evaluation (Addendum)
Anesthesia Evaluation  Patient identified by MRN, date of birth, ID band Patient awake    Reviewed: Allergy & Precautions, NPO status , Patient's Chart, lab work & pertinent test results  Airway Mallampati: II  TM Distance: >3 FB Neck ROM: Full    Dental  (+) Edentulous Upper, Edentulous Lower, Upper Dentures, Lower Dentures   Pulmonary neg pulmonary ROS,    Pulmonary exam normal breath sounds clear to auscultation       Cardiovascular hypertension, Pt. on medications Normal cardiovascular exam+ dysrhythmias (on eliquis) Atrial Fibrillation  Rhythm:Irregular Rate:Normal  TTE 2022 1. Left ventricular ejection fraction, by estimation, is 55 to 60%. The  left ventricle has normal function. The left ventricle has no regional  wall motion abnormalities. Left ventricular diastolic function could not  be evaluated.  2. Right ventricular systolic function is normal. The right ventricular  size is normal. There is moderately elevated pulmonary artery systolic  pressure.  3. Left atrial size was moderately dilated.  4. Right atrial size was moderately dilated.  5. The mitral valve is normal in structure. Mild mitral valve  regurgitation. No evidence of mitral stenosis.  6. Tricuspid valve regurgitation is severe.  7. The aortic valve is tricuspid. Aortic valve regurgitation is mild.  Mild to moderate aortic valve sclerosis/calcification is present, without  any evidence of aortic stenosis.  8. Aortic dilatation noted. There is mild dilatation of the aortic root,  measuring 44 mm.  9. The inferior vena cava is dilated in size with >50% respiratory  variability, suggesting right atrial pressure of 8 mmHg.   Neuro/Psych CVA negative psych ROS   GI/Hepatic negative GI ROS, Neg liver ROS,   Endo/Other  negative endocrine ROS  Renal/GU Renal InsufficiencyRenal disease  negative genitourinary   Musculoskeletal negative  musculoskeletal ROS (+)   Abdominal   Peds  Hematology negative hematology ROS (+)   Anesthesia Other Findings   Reproductive/Obstetrics                            Anesthesia Physical Anesthesia Plan  ASA: 3  Anesthesia Plan: General   Post-op Pain Management:    Induction: Intravenous  PONV Risk Score and Plan: 3 and Propofol infusion and Treatment may vary due to age or medical condition  Airway Management Planned: Natural Airway  Additional Equipment:   Intra-op Plan:   Post-operative Plan:   Informed Consent: I have reviewed the patients History and Physical, chart, labs and discussed the procedure including the risks, benefits and alternatives for the proposed anesthesia with the patient or authorized representative who has indicated his/her understanding and acceptance.     Dental advisory given  Plan Discussed with: CRNA  Anesthesia Plan Comments:         Anesthesia Quick Evaluation

## 2021-08-29 NOTE — Anesthesia Postprocedure Evaluation (Signed)
Anesthesia Post Note  Patient: Stephanie Reyes  Procedure(s) Performed: CARDIOVERSION     Patient location during evaluation: Endoscopy Anesthesia Type: General Level of consciousness: awake and alert Pain management: pain level controlled Vital Signs Assessment: post-procedure vital signs reviewed and stable Respiratory status: spontaneous breathing, nonlabored ventilation, respiratory function stable and patient connected to nasal cannula oxygen Cardiovascular status: blood pressure returned to baseline and stable Postop Assessment: no apparent nausea or vomiting Anesthetic complications: no   No notable events documented.  Last Vitals:  Vitals:   08/29/21 0822 08/29/21 0832  BP: (!) 150/82 (!) 158/68  Pulse: 65 66  Resp: 20 20  Temp:    SpO2: 95% 96%    Last Pain:  Vitals:   08/29/21 0832  TempSrc:   PainSc: 0-No pain                 Brianna Bennett L Rosealynn Mateus

## 2021-08-29 NOTE — Transfer of Care (Addendum)
Immediate Anesthesia Transfer of Care Note  Patient: Stephanie Reyes  Procedure(s) Performed: CARDIOVERSION  Patient Location: PACU  Anesthesia Type:General  Level of Consciousness: awake, alert  and oriented  Airway & Oxygen Therapy: Patient Spontanous Breathing  Post-op Assessment: Report given to RN, Post -op Vital signs reviewed and stable and Patient moving all extremities  Post vital signs: Reviewed and stable  Last Vitals:  Vitals Value Taken Time  BP    Temp    Pulse    Resp    SpO2      Last Pain:  Vitals:   08/29/21 0708  TempSrc: Temporal  PainSc: 0-No pain         Complications: No notable events documented.

## 2021-08-29 NOTE — Anesthesia Procedure Notes (Signed)
Procedure Name: General with mask airway Date/Time: 08/29/2021 7:57 AM Performed by: Colon Flattery, CRNA Pre-anesthesia Checklist: Patient identified, Emergency Drugs available, Suction available and Patient being monitored Patient Re-evaluated:Patient Re-evaluated prior to induction Oxygen Delivery Method: Ambu bag Preoxygenation: Pre-oxygenation with 100% oxygen Induction Type: IV induction Placement Confirmation: positive ETCO2 Dental Injury: Teeth and Oropharynx as per pre-operative assessment

## 2021-08-30 ENCOUNTER — Encounter (HOSPITAL_COMMUNITY): Payer: Self-pay | Admitting: Internal Medicine

## 2021-09-05 DIAGNOSIS — Z9181 History of falling: Secondary | ICD-10-CM | POA: Diagnosis not present

## 2021-09-05 DIAGNOSIS — Z96642 Presence of left artificial hip joint: Secondary | ICD-10-CM | POA: Diagnosis not present

## 2021-09-05 DIAGNOSIS — H9193 Unspecified hearing loss, bilateral: Secondary | ICD-10-CM | POA: Diagnosis not present

## 2021-09-05 DIAGNOSIS — I5042 Chronic combined systolic (congestive) and diastolic (congestive) heart failure: Secondary | ICD-10-CM | POA: Diagnosis not present

## 2021-09-05 DIAGNOSIS — M47812 Spondylosis without myelopathy or radiculopathy, cervical region: Secondary | ICD-10-CM | POA: Diagnosis not present

## 2021-09-05 DIAGNOSIS — I110 Hypertensive heart disease with heart failure: Secondary | ICD-10-CM | POA: Diagnosis not present

## 2021-09-05 DIAGNOSIS — I69398 Other sequelae of cerebral infarction: Secondary | ICD-10-CM | POA: Diagnosis not present

## 2021-09-05 DIAGNOSIS — Z7901 Long term (current) use of anticoagulants: Secondary | ICD-10-CM | POA: Diagnosis not present

## 2021-09-05 DIAGNOSIS — I480 Paroxysmal atrial fibrillation: Secondary | ICD-10-CM | POA: Diagnosis not present

## 2021-09-05 DIAGNOSIS — I088 Other rheumatic multiple valve diseases: Secondary | ICD-10-CM | POA: Diagnosis not present

## 2021-09-05 DIAGNOSIS — I959 Hypotension, unspecified: Secondary | ICD-10-CM | POA: Diagnosis not present

## 2021-09-05 DIAGNOSIS — M8580 Other specified disorders of bone density and structure, unspecified site: Secondary | ICD-10-CM | POA: Diagnosis not present

## 2021-09-05 DIAGNOSIS — M4802 Spinal stenosis, cervical region: Secondary | ICD-10-CM | POA: Diagnosis not present

## 2021-09-05 DIAGNOSIS — R2689 Other abnormalities of gait and mobility: Secondary | ICD-10-CM | POA: Diagnosis not present

## 2021-09-05 DIAGNOSIS — K449 Diaphragmatic hernia without obstruction or gangrene: Secondary | ICD-10-CM | POA: Diagnosis not present

## 2021-09-07 DIAGNOSIS — H9193 Unspecified hearing loss, bilateral: Secondary | ICD-10-CM | POA: Diagnosis not present

## 2021-09-07 DIAGNOSIS — Z9181 History of falling: Secondary | ICD-10-CM | POA: Diagnosis not present

## 2021-09-07 DIAGNOSIS — Z7901 Long term (current) use of anticoagulants: Secondary | ICD-10-CM | POA: Diagnosis not present

## 2021-09-07 DIAGNOSIS — I5042 Chronic combined systolic (congestive) and diastolic (congestive) heart failure: Secondary | ICD-10-CM | POA: Diagnosis not present

## 2021-09-07 DIAGNOSIS — I69398 Other sequelae of cerebral infarction: Secondary | ICD-10-CM | POA: Diagnosis not present

## 2021-09-07 DIAGNOSIS — I480 Paroxysmal atrial fibrillation: Secondary | ICD-10-CM | POA: Diagnosis not present

## 2021-09-07 DIAGNOSIS — M4802 Spinal stenosis, cervical region: Secondary | ICD-10-CM | POA: Diagnosis not present

## 2021-09-07 DIAGNOSIS — R2689 Other abnormalities of gait and mobility: Secondary | ICD-10-CM | POA: Diagnosis not present

## 2021-09-07 DIAGNOSIS — I088 Other rheumatic multiple valve diseases: Secondary | ICD-10-CM | POA: Diagnosis not present

## 2021-09-07 DIAGNOSIS — K449 Diaphragmatic hernia without obstruction or gangrene: Secondary | ICD-10-CM | POA: Diagnosis not present

## 2021-09-07 DIAGNOSIS — I110 Hypertensive heart disease with heart failure: Secondary | ICD-10-CM | POA: Diagnosis not present

## 2021-09-07 DIAGNOSIS — M47812 Spondylosis without myelopathy or radiculopathy, cervical region: Secondary | ICD-10-CM | POA: Diagnosis not present

## 2021-09-07 DIAGNOSIS — Z96642 Presence of left artificial hip joint: Secondary | ICD-10-CM | POA: Diagnosis not present

## 2021-09-07 DIAGNOSIS — I959 Hypotension, unspecified: Secondary | ICD-10-CM | POA: Diagnosis not present

## 2021-09-07 DIAGNOSIS — M8580 Other specified disorders of bone density and structure, unspecified site: Secondary | ICD-10-CM | POA: Diagnosis not present

## 2021-09-15 ENCOUNTER — Inpatient Hospital Stay: Payer: Medicare Other | Admitting: Adult Health

## 2021-09-18 NOTE — Progress Notes (Signed)
Cardiology Office Note:    Date:  09/22/2021   ID:  Stephanie Reyes, DOB 11-02-1924, MRN [id number]  PCP:  Shon Hale, MD  Cardiologist:  Chrystie Nose, MD   Referring MD: Shon Hale, *   Chief Complaint  Patient presents with   Follow-up    Post DCCV    History of Present Illness:    Stephanie Reyes is a 87 y.o. female with a hx of syncope, PACs, HTN, and new Afb RVR.  She was initially seen in the hospital in April 2018 for syncopal episode.  Telemetry showed frequent ectopy but no atrial fibrillation.  Echocardiogram at that time was unremarkable with exception of grade 1 diastolic dysfunction.  Outpatient monitor showed underlying sinus rhythm with PACs, no A. Fib.  She has struggled with orthostatic hypotension and was started on midodrine as needed.  Echocardiogram in May 2021 for shortness of breath showed mildly reduced EF to 45-50%, mild to moderate AI and aortic root dilation of 4.3 cm. SOB likely due to AI. She was started on 20 mg lasix.  She was recently hospitalized on 07/27/2021 for another episode of near syncope and weakness.  She was found to be in fib RVR with heart rates in the 100-150 range.  Bolus IV Cardizem did not improve her heart rate.  On arrival she was hypertensive and tachycardic.  She is rate controlled with diltiazem.  Echocardiogram was performed and revealed LVEF of 55 to 60%, normal RV function, moderate biatrial enlargement, mild MR and aortic root dilation of 44 mm.  Neurology was consulted for possible stroke with imaging suggestive of small acute infarcts as well as chronic microvascular changes.  Given this, she was anticoagulated with Eliquis 2.5 mg twice daily.  TEE guided cardioversion was considered, however due to scheduling this was not performed during her hospitalization.  Plan for discharge with close follow-up and OP DCCV.  She was loaded with amiodarone and discharged on 200 mg amio BID x 2 weeks and cardizem 120 mg  daily. Midodrine was held. At her follow up appt, she remained in Afib and was scheduled for DCCV, which was completed on 08/29/21. She was successfully cardioverted to sinus rhythm with PACs.  She presents for follow up. She is here with her daughter. She is feeling much better. There seemed to be a miscommunication and both the pt and daughter thought the DCCV was not successful. We reviewed notes and procedure. EKG today with sinus rhythm. No bleeding on eliquis.   She wants to go to her daughter's house in [place] [facility] and watch the sunset. I think this is a great idea, but she can't travel 2 days in the car on lasix. In review of her recent echo, EF normal. I suggested trial of holding lasix for one day and watch symptoms. If she does well, can trial 2 days. She also wants to have a cosmo at a piano bar in [place]. I think this is a great idea, but suggested a non-alcoholic cosmo so her Afib is not triggered.    Past Medical History:  Diagnosis Date   Essential hypertension    Premature atrial contractions    Syncope    a. 02/2017 Echo: EF 55-60%, mild LVH, no rwma, Gr1 DD, triv AI, mildly dil Ao root, mild MR, mod TR, PASP .    Past Surgical History:  Procedure Laterality Date   ANTERIOR APPROACH HEMI HIP ARTHROPLASTY Left 02/09/2018   Procedure: ANTERIOR APPROACH HEMI HIP ARTHROPLASTY;  Surgeon: Rod Can, MD;  Location: [facility];  Service: Orthopedics;  Laterality: Left;   CARDIOVERSION N/A 08/29/2021   Procedure: CARDIOVERSION;  Surgeon: Pixie Casino, MD;  Location: [facility];  Service: Cardiovascular;  Laterality: N/A;   HERNIA REPAIR     TONSILLECTOMY      Current Medications: Current Meds  Medication Sig   acetaminophen (TYLENOL) 325 MG tablet Take 325 mg by mouth every 6 (six) hours as needed for moderate pain or headache.   apixaban (ELIQUIS) 2.5 MG TABS tablet Take 1 tablet (2.5 mg total) by mouth 2 (two) times daily.   atorvastatin (LIPITOR) 10 MG tablet Take 1  tablet (10 mg total) by mouth daily. (Patient taking differently: Take 10 mg by mouth every evening.)   diltiazem (CARDIZEM CD) 120 MG 24 hr capsule Take 1 capsule (120 mg total) by mouth daily.   furosemide (LASIX) 20 MG tablet TAKE 1 TABLET BY MOUTH EVERY DAY (Patient taking differently: Take 20 mg by mouth daily.)   hydrocortisone cream 1 % Apply 1 application topically 2 (two) times daily as needed for itching.   Multiple Vitamin (MULTIVITAMIN WITH MINERALS) TABS tablet Take 1 tablet by mouth every other day.   [DISCONTINUED] amiodarone (PACERONE) 200 MG tablet Take 1 tablet (200 mg total) by mouth daily.     Allergies:   Fluvirin  [influenza virus vaccine], Aspirin, Clindamycin/lincomycin, Hydrocodone, Penicillins, Ampicillin, and Eggs or egg-derived products   Social History   Socioeconomic History   Marital status: Divorced    Spouse name: Not on file   Number of children: Not on file   Years of education: Not on file   Highest education level: Not on file  Occupational History   Not on file  Tobacco Use   Smoking status: Never   Smokeless tobacco: Never  Vaping Use   Vaping Use: Never used  Substance and Sexual Activity   Alcohol use: No   Drug use: No   Sexual activity: Not on file  Other Topics Concern   Not on file  Social History Narrative   Not on file   Social Determinants of Health   Financial Resource Strain: Not on file  Food Insecurity: Not on file  Transportation Needs: Not on file  Physical Activity: Not on file  Stress: Not on file  Social Connections: Not on file     Family History: The patient's family history includes Hypertension in her mother.  ROS:   Please see the history of present illness.     All other systems reviewed and are negative.  EKGs/Labs/Other Studies Reviewed:    The following studies were reviewed today:  Echo 07/28/21:  1. Left ventricular ejection fraction, by estimation, is 55 to 60%. The  left ventricle has normal  function. The left ventricle has no regional  wall motion abnormalities. Left ventricular diastolic function could not  be evaluated.   2. Right ventricular systolic function is normal. The right ventricular  size is normal. There is moderately elevated pulmonary artery systolic  pressure.   3. Left atrial size was moderately dilated.   4. Right atrial size was moderately dilated.   5. The mitral valve is normal in structure. Mild mitral valve  regurgitation. No evidence of mitral stenosis.   6. Tricuspid valve regurgitation is severe.   7. The aortic valve is tricuspid. Aortic valve regurgitation is mild.  Mild to moderate aortic valve sclerosis/calcification is present, without  any evidence of aortic stenosis.   8. Aortic  dilatation noted. There is mild dilatation of the aortic root,  measuring 44 mm.   9. The inferior vena cava is dilated in size with >50% respiratory  variability, suggesting right atrial pressure of 8 mmHg.   EKG:  EKG is  ordered today.  The ekg ordered today demonstrates sinus rhythm with HR 68, anterior Q waves  Recent Labs: 07/27/2021: Magnesium 2.0; TSH 3.306 07/28/2021: ALT 15 08/23/2021: BUN 12; Creatinine, Ser 1.06; Hemoglobin 14.9; Platelets 248; Potassium 4.7; Sodium 140  Recent Lipid Panel    Component Value Date/Time   CHOL 141 07/28/2021 0430   TRIG 44 07/28/2021 0430   HDL 59 07/28/2021 0430   CHOLHDL 2.4 07/28/2021 0430   VLDL 9 07/28/2021 0430   LDLCALC 73 07/28/2021 0430    Physical Exam:    VS:  BP 122/68   Pulse 82   Ht 5\' 5"  (1.651 m)   Wt 113 lb 6.4 oz (51.4 kg)   SpO2 99%   BMI 18.87 kg/m     Wt Readings from Last 3 Encounters:  09/22/21 113 lb 6.4 oz (51.4 kg)  08/29/21 112 lb (50.8 kg)  08/09/21 109 lb 6.4 oz (49.6 kg)     GEN: elderly thin female in NAD HEENT: Normal NECK: No JVD; No carotid bruits LYMPHATICS: No lymphadenopathy CARDIAC: RRR, no murmurs, rubs, gallops RESPIRATORY:  Clear to auscultation without  rales, wheezing or rhonchi  ABDOMEN: Soft, non-tender, non-distended MUSCULOSKELETAL:  No edema; No deformity  SKIN: Warm and dry NEUROLOGIC:  Alert and oriented x 3 PSYCHIATRIC:  Normal affect   ASSESSMENT:    1. Atrial fibrillation with RVR ([place])   2. Chronic anticoagulation   3. Benign essential HTN   4. Orthostatic hypotension   5. Near syncope   6. Aortic valve insufficiency, etiology of cardiac valve disease unspecified   7. Aneurysm of ascending aorta without rupture    PLAN:    In order of problems listed above:  Atrial fibrillation-chronicity unknown Chronic anticoagulation Successful DCCV on 08/29/21. EKG today with inus rhythm. Continue amiodarone 200 mg. No bleeding issues on 2.5 mg eliquis.  Would check thyroid in March.    Hypertension Orthostatic hypotension Syncope - continue on 120 mg cardizem - no recent syncope - BP log reveals no evidence of hypotension - BP look great   AI Ascending aortic aneurysm - repeat echo in 6 months    Follow up with Dr. Debara Pickett after echo, on or about March 2023.   Medication Adjustments/Labs and Tests Ordered: Current medicines are reviewed at length with the patient today.  Concerns regarding medicines are outlined above.  Orders Placed This Encounter  Procedures   EKG 12-Lead   ECHOCARDIOGRAM COMPLETE   Meds ordered this encounter  Medications   amiodarone (PACERONE) 200 MG tablet    Sig: Take 1 tablet (200 mg total) by mouth daily.    Dispense:  30 tablet    Refill:  1    Signed, Ledora Bottcher, [place]  09/22/2021 4:23 PM    [facility]

## 2021-09-22 ENCOUNTER — Encounter: Payer: Self-pay | Admitting: Physician Assistant

## 2021-09-22 ENCOUNTER — Ambulatory Visit (INDEPENDENT_AMBULATORY_CARE_PROVIDER_SITE_OTHER): Payer: Medicare Other | Admitting: Physician Assistant

## 2021-09-22 ENCOUNTER — Other Ambulatory Visit: Payer: Self-pay

## 2021-09-22 VITALS — BP 122/68 | HR 82 | Ht 65.0 in | Wt 113.4 lb

## 2021-09-22 DIAGNOSIS — I951 Orthostatic hypotension: Secondary | ICD-10-CM | POA: Diagnosis not present

## 2021-09-22 DIAGNOSIS — I10 Essential (primary) hypertension: Secondary | ICD-10-CM

## 2021-09-22 DIAGNOSIS — I351 Nonrheumatic aortic (valve) insufficiency: Secondary | ICD-10-CM

## 2021-09-22 DIAGNOSIS — I7121 Aneurysm of the ascending aorta, without rupture: Secondary | ICD-10-CM | POA: Diagnosis not present

## 2021-09-22 DIAGNOSIS — I4891 Unspecified atrial fibrillation: Secondary | ICD-10-CM | POA: Diagnosis not present

## 2021-09-22 DIAGNOSIS — Z7901 Long term (current) use of anticoagulants: Secondary | ICD-10-CM | POA: Diagnosis not present

## 2021-09-22 DIAGNOSIS — R55 Syncope and collapse: Secondary | ICD-10-CM

## 2021-09-22 MED ORDER — AMIODARONE HCL 200 MG PO TABS: 200.0000 mg | ORAL_TABLET | Freq: Every day | ORAL | 1 refills | Status: DC

## 2021-09-22 NOTE — Patient Instructions (Signed)
Medication Instructions:  No Changes *If you need a refill on your cardiac medications before your next appointment, please call your pharmacy*   Lab Work: No Labs If you have labs (blood work) drawn today and your tests are completely normal, you will receive your results only by: MyChart Message (if you have MyChart) OR A paper copy in the mail If you have any lab test that is abnormal or we need to change your treatment, we will call you to review the results.   Testing/Procedures: [address]. ( To Be Done March 2023). Your physician has requested that you have an echocardiogram. Echocardiography is a painless test that uses sound waves to create images of your heart. It provides your doctor with information about the size and shape of your heart and how well your heart's chambers and valves are working. This procedure takes approximately one hour. There are no restrictions for this procedure.    Follow-Up: At [facility], you and your health needs are our priority.  As part of our continuing mission to provide you with exceptional heart care, we have created designated Provider Care Teams.  These Care Teams include your primary Cardiologist (physician) and Advanced Practice Providers (APPs -  Physician Assistants and Nurse Practitioners) who all work together to provide you with the care you need, when you need it.  We recommend signing up for the patient portal called "MyChart".  Sign up information is provided on this After Visit Summary.  MyChart is used to connect with patients for Virtual Visits (Telemedicine).  Patients are able to view lab/test results, encounter notes, upcoming appointments, etc.  Non-urgent messages can be sent to your provider as well.   To learn more about what you can do with MyChart, go to [email or web address].    Your next appointment:   4 month(s)  The format for your next appointment:   In Person  Provider:   Chrystie Nose, MD

## 2021-10-18 ENCOUNTER — Other Ambulatory Visit: Payer: Self-pay

## 2021-10-18 MED ORDER — AMIODARONE HCL 200 MG PO TABS: 200.0000 mg | ORAL_TABLET | Freq: Every day | ORAL | 1 refills | Status: DC

## 2021-11-12 ENCOUNTER — Other Ambulatory Visit: Payer: Self-pay | Admitting: Internal Medicine

## 2021-12-13 ENCOUNTER — Other Ambulatory Visit: Payer: Self-pay | Admitting: Internal Medicine

## 2021-12-25 ENCOUNTER — Encounter: Payer: Self-pay | Admitting: Internal Medicine

## 2021-12-26 ENCOUNTER — Encounter: Payer: Self-pay | Admitting: Internal Medicine

## 2021-12-26 ENCOUNTER — Other Ambulatory Visit: Payer: Self-pay

## 2021-12-26 MED ORDER — DILTIAZEM HCL ER COATED BEADS 120 MG PO CP24: 120.0000 mg | ORAL_CAPSULE | Freq: Every day | ORAL | 3 refills | Status: DC

## 2022-01-03 IMAGING — CR DG CHEST 2V
2 series · 2 of 2 positions shown · non-contrast
Comparison: Chest x-ray dated June 06, 2006

CLINICAL DATA: Syncope

EXAM:
CHEST - 2 VIEW

[chest lat]
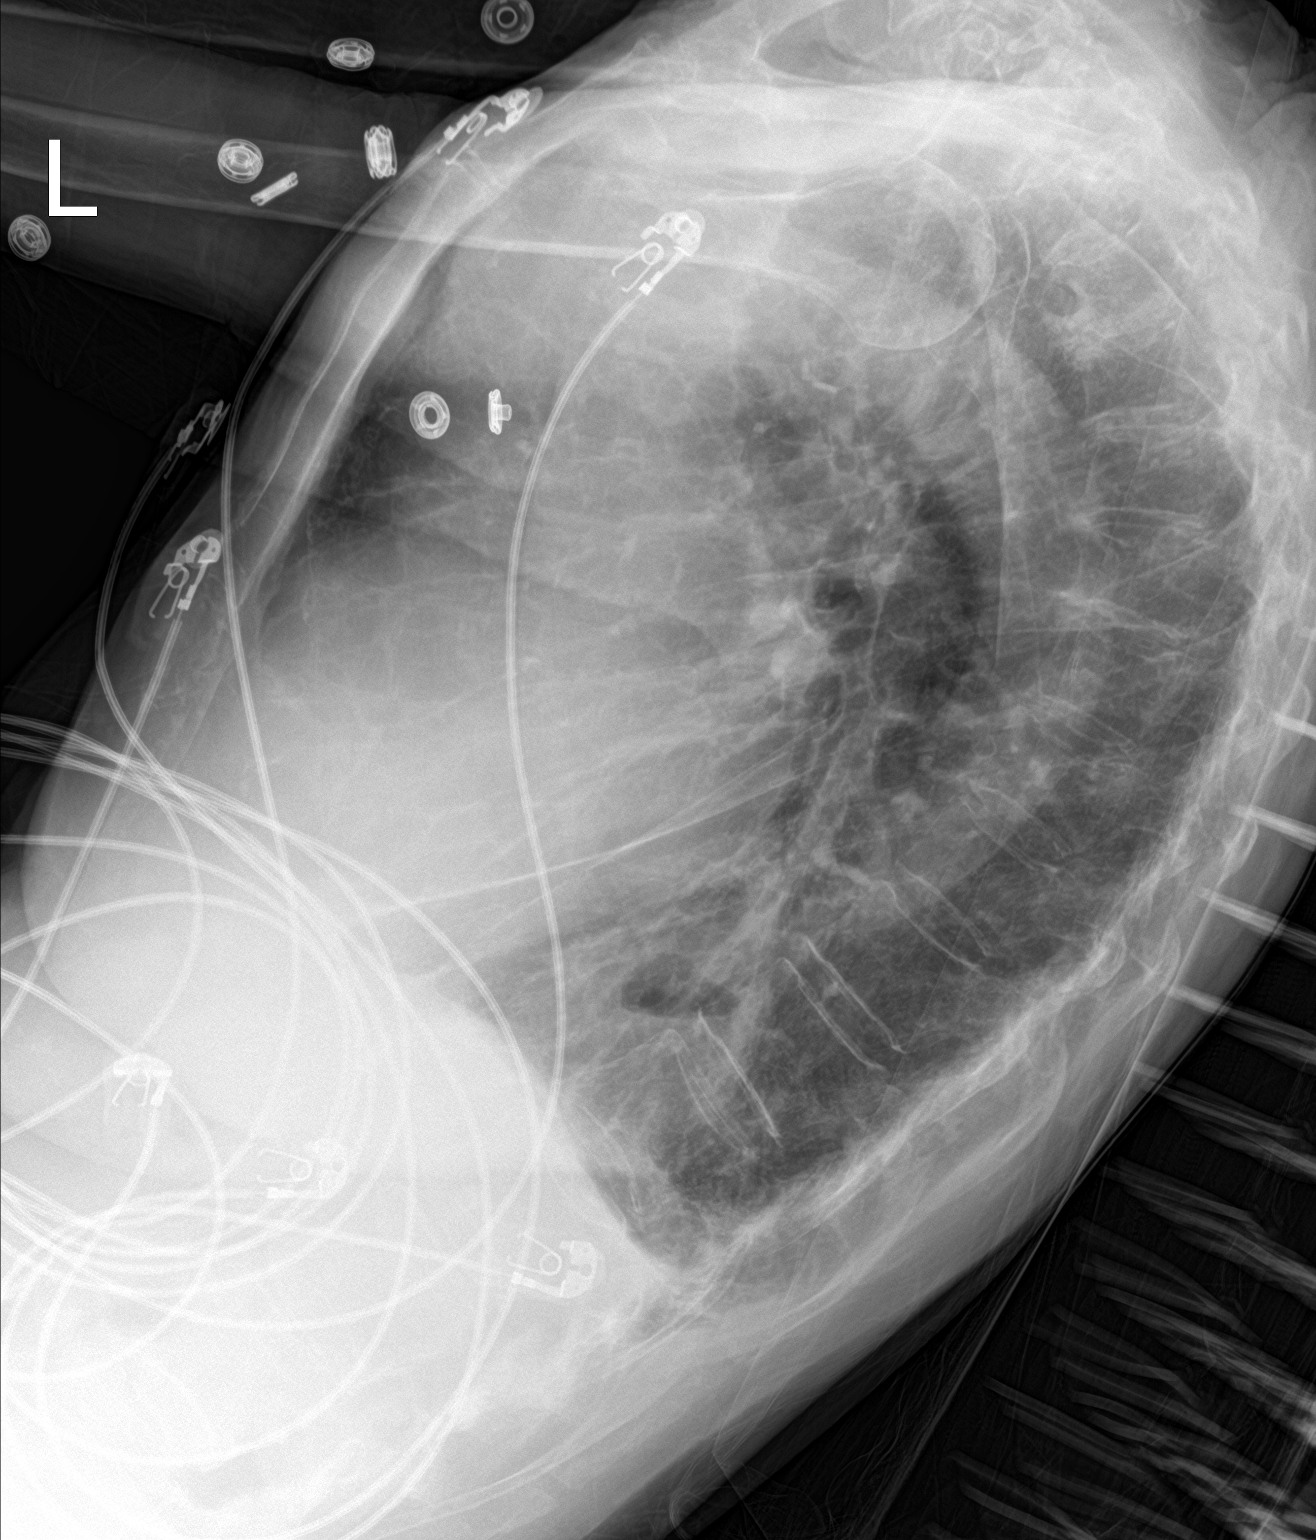

[chest ap]
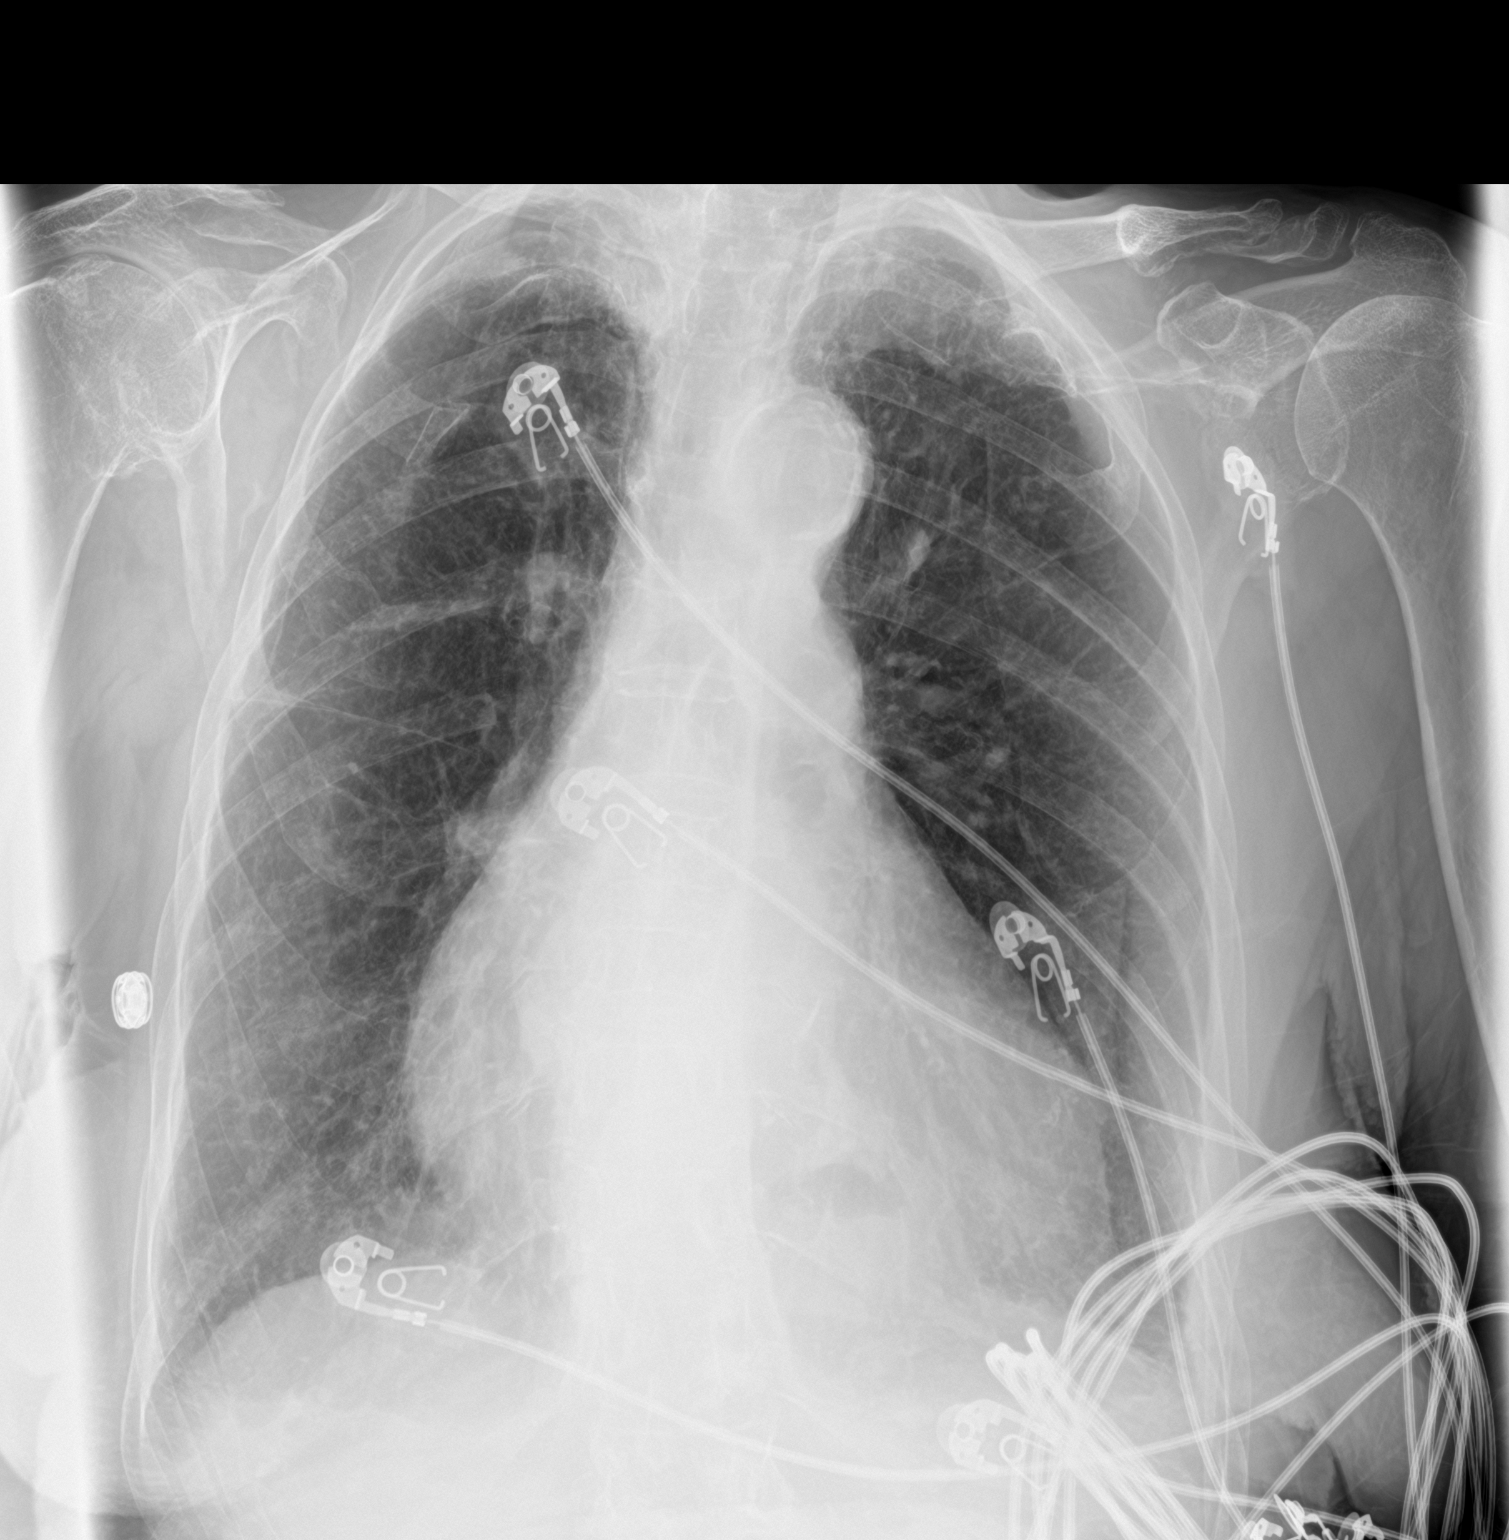

[2 of 2 positions shown; findings below may reference images not displayed]

FINDINGS: Cardiomegaly, new compared to 3117 prior. Trace left pleural
effusion. Moderate hiatal hernia. Scattered bilateral linear
opacities, likely due to atelectasis or scarring. No focal
consolidation. No evidence of pneumothorax.
IMPRESSION: Cardiomegaly, new compared to 3117 prior. Trace left pleural
effusion.

Scattered bilateral linear opacities, likely due to atelectasis or
scarring. No focal consolidation.

Moderate hiatal hernia.

## 2022-01-11 ENCOUNTER — Encounter: Payer: Self-pay | Admitting: Internal Medicine

## 2022-01-11 ENCOUNTER — Telehealth (HOSPITAL_COMMUNITY): Payer: Self-pay | Admitting: Internal Medicine

## 2022-01-11 NOTE — Telephone Encounter (Signed)
Patients daughter called and states that patient refuses to come for echocardiogram.  She states that patient is 9 and just doesn't want to have it. Order will be removed from the echo wq and if they decide to schedule again we will reinstate the order. Thank you ?

## 2022-01-13 NOTE — Telephone Encounter (Signed)
Thanks Lattie Haw-  this has been cancelled by the echo pool ? ?Dr Lemmie Evens ?

## 2022-01-17 ENCOUNTER — Other Ambulatory Visit (HOSPITAL_COMMUNITY): Payer: Medicare Other

## 2022-01-27 ENCOUNTER — Telehealth (INDEPENDENT_AMBULATORY_CARE_PROVIDER_SITE_OTHER): Payer: Medicare Other | Admitting: Internal Medicine

## 2022-01-27 ENCOUNTER — Encounter: Payer: Self-pay | Admitting: Internal Medicine

## 2022-01-27 ENCOUNTER — Ambulatory Visit: Payer: Medicare Other | Admitting: Internal Medicine

## 2022-01-27 ENCOUNTER — Other Ambulatory Visit: Payer: Self-pay

## 2022-01-27 VITALS — BP 120/77 | HR 67 | Ht 65.0 in | Wt 111.0 lb

## 2022-01-27 DIAGNOSIS — R0989 Other specified symptoms and signs involving the circulatory and respiratory systems: Secondary | ICD-10-CM | POA: Diagnosis not present

## 2022-01-27 DIAGNOSIS — I5022 Chronic systolic (congestive) heart failure: Secondary | ICD-10-CM

## 2022-01-27 DIAGNOSIS — I7121 Aneurysm of the ascending aorta, without rupture: Secondary | ICD-10-CM | POA: Diagnosis not present

## 2022-01-27 DIAGNOSIS — I10 Essential (primary) hypertension: Secondary | ICD-10-CM | POA: Diagnosis not present

## 2022-01-27 NOTE — Patient Instructions (Signed)
Medication Instructions:  Your physician recommends that you continue on your current medications as directed. Please refer to the Current Medication list given to you today.  *If you need a refill on your cardiac medications before your next appointment, please call your pharmacy*   Follow-Up: At [facility], you and your health needs are our priority.  As part of our continuing mission to provide you with exceptional heart care, we have created designated Provider Care Teams.  These Care Teams include your primary Cardiologist (physician) and Advanced Practice Providers (APPs -  Physician Assistants and Nurse Practitioners) who all work together to provide you with the care you need, when you need it.  We recommend signing up for the patient portal called "MyChart".  Sign up information is provided on this After Visit Summary.  MyChart is used to connect with patients for Virtual Visits (Telemedicine).  Patients are able to view lab/test results, encounter notes, upcoming appointments, etc.  Non-urgent messages can be sent to your provider as well.   To learn more about what you can do with MyChart, go to [email or web address].    Your next appointment:   6 month(s)  The format for your next appointment:   In Person  Provider:   Kenneth C Hilty, MD {  

## 2022-01-27 NOTE — Progress Notes (Signed)
? ?Virtual Visit via Telephone Note  ? ?This visit type was conducted due to national recommendations for restrictions regarding the COVID-19 Pandemic (e.g. social distancing) in an effort to limit this patient's exposure and mitigate transmission in our community.  Due to her co-morbid illnesses, this patient is at least at moderate risk for complications without adequate follow up.  This format is felt to be most appropriate for this patient at this time.  The patient did not have access to video technology/had technical difficulties with video requiring transitioning to audio format only (telephone).  All issues noted in this document were discussed and addressed.  No physical exam could be performed with this format.  Please refer to the patient's chart for her  consent to telehealth for [facility].  ? ?Evaluation Performed: Telephone follow-up ? ?Date:  01/27/2022  ? ?ID:  Stephanie Reyes, DOB 02-28-1924, MRN [id number] ? ?Patient Location:  ?[address] ?[place] [address] ? ?Provider location:   ?[address], [address] ?[employer] [place], [place] [address] ? ?PCP:  Glenis Smoker, MD  ?Cardiologist:  Pixie Casino, MD ?Electrophysiologist:  None  ? ?Chief Complaint: Fatigue ? ?History of Present Illness:   ? ?Stephanie Reyes is a 88 y.o. female who presents via Engineer, civil (consulting) for a telehealth visit today.  Stephanie Reyes is a 88 y.o. female with a past medial history significant for essential hypertension, PACs and a syncopal event. I first met her in the hospital after she suffered a syncopal event was found to have frequent ectopy on her monitor. There was however no evidence for atrial fibrillation. Subsequently we placed an outpatient monitor which showed no evidence of A. fib. She did undergo an echocardiogram which showed mild LVH, EF 32-20%, diastolic dysfunction and moderate pulmonary hypertension. The aortic root was mildly dilated to 4.1 cm. She followed up with Stephanie Bayley, Stephanie Reyes, who noted she was still hypertensive and increase her amlodipine from 2.5-5 mg daily. She reports intolerance to lisinopril in the past and is also on metoprolol. Blood pressure remains elevated today 151/91 and she reports some lower extremity swelling and shortness of breath with exertion. She denies any further syncopal episodes. ? ?11/16/2017  ? ?Aura returns today for follow-up. She reports feeling well. Her edema has improved on chlorthalidone, but her right foot still swells some. She has better BP control in general. She is still very active, despite almost turning 89. She denies chest pain or dyspnea.  ? ?04/05/2018 ? ?Stephanie Reyes returns today for follow-up.  Fortunately she recently had a fall.  She did not syncopized although felt dizzy.  She fractured her left hip and underwent hemi-arthroplasty.  Since then she has been recovering well although had problems with orthostatic hypotension.  In the hospital Dr. Marlou Porch stopped her metoprolol, amlodipine and chlorthalidone.  Blood pressure remained low when she was placed on midodrine.  She was instructed to take this twice daily although blood pressures were quite elevated on the midodrine at 254-270 systolic over 62-376 diastolic.  She was then instructed to come off the medication and use it as needed.  Since then her blood pressures have been essentially normal with only occasional blood pressures in the 90s for which she was given the medication.  She remains asymptomatic and is not had no syncopal episodes. ? ?08/07/2019 ? ?Stephanie Reyes is seen today in follow-up.  I am pleased to report she has had no further syncopal episodes.  She says she is only  used about 5 midodrine pills over the past year.  She brought in blood pressures which indicate her blood pressures typically in the 509T systolic over 26Z and 12W.  She did have one episode with a blood pressure was at 85/49.  She took midodrine and noted marked rapid improvement in her  blood pressure.  Overall she is very pleased at how she is feeling and happy to be off medication. ? ?03/01/2020 ? ?Stephanie Reyes was seen today in follow-up. She complains of some dyspnea, but mostly with exertion and not all the time.  She says that she has not required midodrine at all this year and took it maybe 4 times last year.  In general home blood pressure readings seem to be somewhat labile.  There are some normal blood pressure readings and at times diastolics over 580.  More recently it seems that her blood pressures are little more elevated, but she is on no blood pressure medication.  Her last echo was in 2018 which showed normal systolic function, grade 1 diastolic dysfunction, dilated aortic root to 4.1 cm and moderate pulmonary hypertension.  This will require follow-up, especially with some shortness of breath complaints.  Besides this, however she says she feels well and is active and does not have any complaints especially given her age of 88. ? ?01/27/2022 ? ?Stephanie Reyes is seen today via telephone follow-up.  She was not feeling well and could not come in and have her echo and therefore it was canceled.  She did not want to do a video visit.  She says she has had a cold or some other symptoms and has been fatigued recently.  She denies any chest pain.  Her blood pressure is actually pretty well controlled.  No issues with hypotension. ? ?The patient does not have symptoms concerning for COVID-19 infection (fever, chills, cough, or new SHORTNESS OF BREATH).  ? ? ?Prior CV studies:   ?The following studies were reviewed today: ? ?Heart reviewed, lab work ? ?PMHx:  ?Past Medical History:  ?Diagnosis Date  ? Essential hypertension   ? Premature atrial contractions   ? Syncope   ? a. 02/2017 Echo: EF 55-60%, mild LVH, no rwma, Gr1 DD, triv AI, mildly dil Ao root, mild MR, mod TR, PASP 49mHg.  ? ? ?Past Surgical History:  ?Procedure Laterality Date  ? ANTERIOR APPROACH HEMI HIP ARTHROPLASTY Left  02/09/2018  ? Procedure: ANTERIOR APPROACH HEMI HIP ARTHROPLASTY;  Surgeon: SRod Can MD;  [facility]  Service: Orthopedics;  Laterality: Left;  ? CARDIOVERSION N/A 08/29/2021  ? Procedure: CARDIOVERSION;  Surgeon: HPixie Casino MD;  [facility];  Service: Cardiovascular;  Laterality: N/A;  ? HERNIA REPAIR    ? TONSILLECTOMY    ? ? ?FAMHx:  ?Family History  ?Problem Relation Age of Onset  ? Hypertension Mother   ? ? ?SOCHx:  ? reports that she has never smoked. She has never used smokeless tobacco. She reports that she does not drink alcohol and does not use drugs. ? ?ALLERGIES:  ?Allergies  ?Allergen Reactions  ? Fluvirin  [Influenza Virus Vaccine] Anaphylaxis  ? Aspirin Nausea And Vomiting and Swelling  ? Clindamycin/Lincomycin Nausea And Vomiting  ? Hydrocodone Nausea And Vomiting  ?  headache  ? Penicillins Nausea And Vomiting and Swelling  ? Ampicillin Nausea And Vomiting  ? Eggs Or Egg-Derived Products   ?  Egg whites ?Reports not being able to use albumin ?Makes pt feel ill  ? ? ?MEDS: ? ?  Current Meds  ?Medication Sig  ? acetaminophen (TYLENOL) 325 MG tablet Take 325 mg by mouth every 6 (six) hours as needed for moderate pain or headache.  ? amiodarone (PACERONE) 200 MG tablet TAKE 1 TABLET BY MOUTH EVERY DAY  ? apixaban (ELIQUIS) 2.5 MG TABS tablet Take 1 tablet (2.5 mg total) by mouth 2 (two) times daily.  ? atorvastatin (LIPITOR) 10 MG tablet Take 1 tablet (10 mg total) by mouth daily. (Patient taking differently: Take 10 mg by mouth every evening.)  ? diltiazem (CARDIZEM CD) 120 MG 24 hr capsule Take 1 capsule (120 mg total) by mouth daily.  ? docusate calcium (SURFAK) 240 MG capsule Take 240 mg by mouth daily.  ? furosemide (LASIX) 20 MG tablet TAKE 1 TABLET BY MOUTH EVERY DAY (Patient taking differently: Take 20 mg by mouth daily.)  ? hydrocortisone cream 1 % Apply 1 application topically 2 (two) times daily as needed for itching.  ? Multiple Vitamin (MULTIVITAMIN WITH  MINERALS) TABS tablet Take 1 tablet by mouth every other day.  ?  ? ?ROS: ?Pertinent items noted in HPI and remainder of comprehensive ROS otherwise negative. ? ?Labs/Other Tests and Data Reviewed:   ? ?Recent Labs: ?

## 2022-02-01 ENCOUNTER — Encounter: Payer: Self-pay | Admitting: Internal Medicine

## 2022-02-01 NOTE — Telephone Encounter (Signed)
Hard to say if it is related to amiodarone or a myriad of other reasons. You are correct, she needs to be seen and evaluated - may need chest x-ray, EKG, echo - this would require her being agreeable to come in the office, which she was not. ? ?Dr Lemmie Evens ?

## 2022-02-15 ENCOUNTER — Other Ambulatory Visit: Payer: Self-pay

## 2022-02-15 MED ORDER — ATORVASTATIN CALCIUM 10 MG PO TABS: 10.0000 mg | ORAL_TABLET | Freq: Every day | ORAL | 5 refills | Status: DC

## 2022-02-20 ENCOUNTER — Other Ambulatory Visit: Payer: Self-pay

## 2022-02-20 MED ORDER — APIXABAN 2.5 MG PO TABS: 2.5000 mg | ORAL_TABLET | Freq: Two times a day (BID) | ORAL | 5 refills | Status: AC

## 2022-02-20 NOTE — Telephone Encounter (Signed)
Prescription refill request for Eliquis received. ?Indication:Afib ?Last office visit:3/23 ?Scr:1.0 ?Age: 88 ?Weight:50.3 kg ? ?Prescription refilled ? ?

## 2022-03-09 ENCOUNTER — Other Ambulatory Visit: Payer: Self-pay | Admitting: Internal Medicine

## 2022-04-13 ENCOUNTER — Encounter: Payer: Self-pay | Admitting: Internal Medicine

## 2022-04-18 NOTE — Telephone Encounter (Signed)
That sounds more like eczema or seborrheic keratoses.  Would advise she see her PCP or dermatologist (if she has one), first. The could determine if they think it is medication related.  Dr Rexene Edison

## 2022-05-25 ENCOUNTER — Telehealth: Payer: Self-pay | Admitting: Internal Medicine

## 2022-05-25 DIAGNOSIS — I959 Hypotension, unspecified: Secondary | ICD-10-CM | POA: Diagnosis not present

## 2022-05-25 DIAGNOSIS — R040 Epistaxis: Secondary | ICD-10-CM | POA: Diagnosis not present

## 2022-05-25 NOTE — Telephone Encounter (Signed)
Pt c/o medication issue:  1. Name of Medication:  amiodarone (PACERONE) 200 MG tablet diltiazem (CARDIZEM CD) 120 MG 24 hr capsule  2. How are you currently taking this medication (dosage and times per day)?   3. Are you having a reaction (difficulty breathing--STAT)?   4. What is your medication issue?   Stephanie Reyes with [facility] states Dr. Chanetta Marshall saw the patient today and HR was in the low 60's, BP was 91/67. She also reports the patient has lost almost 20 lbs in the last year. PCP assumes this may be due to medication doses being too high. Dr. Chanetta Marshall would like to know if Dr. Rennis Golden is willing to reduce Amiodarone and Diltiazem. A call may be returned to patient's daughter with advisement. Otherwise, if questions a call may be returned to [facility] directly at [phone].

## 2022-05-26 NOTE — Telephone Encounter (Signed)
Left detailed message on voicemail.  

## 2022-05-29 NOTE — Telephone Encounter (Signed)
Called [facility] verified receipt of VM message. She states that she passed message on to Dr. Chanetta Marshall

## 2022-05-30 DIAGNOSIS — I10 Essential (primary) hypertension: Secondary | ICD-10-CM | POA: Diagnosis not present

## 2022-06-07 ENCOUNTER — Other Ambulatory Visit: Payer: Self-pay | Admitting: Internal Medicine

## 2022-06-07 NOTE — Telephone Encounter (Signed)
Rx(s) sent to pharmacy electronically.  

## 2022-07-13 ENCOUNTER — Ambulatory Visit: Payer: Medicare Other | Attending: Internal Medicine | Admitting: Internal Medicine

## 2022-07-13 ENCOUNTER — Encounter: Payer: Self-pay | Admitting: Internal Medicine

## 2022-07-13 VITALS — BP 130/70 | HR 73 | Ht 65.0 in | Wt 96.0 lb

## 2022-07-13 DIAGNOSIS — I5022 Chronic systolic (congestive) heart failure: Secondary | ICD-10-CM | POA: Diagnosis not present

## 2022-07-13 DIAGNOSIS — I7121 Aneurysm of the ascending aorta, without rupture: Secondary | ICD-10-CM | POA: Diagnosis not present

## 2022-07-13 DIAGNOSIS — I480 Paroxysmal atrial fibrillation: Secondary | ICD-10-CM | POA: Diagnosis not present

## 2022-07-13 DIAGNOSIS — I351 Nonrheumatic aortic (valve) insufficiency: Secondary | ICD-10-CM

## 2022-07-13 NOTE — Progress Notes (Signed)
OFFICE FOLLOW-UP NOTE  Chief Complaint:  Follow-up syncope  Primary Care Physician: Glenis Smoker, MD  HPI:  Stephanie Reyes is a 88 y.o. female with a past medial history significant for essential hypertension, PACs and a syncopal event. I first met her in the hospital after she suffered a syncopal event was found to have frequent ectopy on her monitor. There was however no evidence for atrial fibrillation. Subsequently we placed an outpatient monitor which showed no evidence of A. fib. She did undergo an echocardiogram which showed mild LVH, EF 09-60%, diastolic dysfunction and moderate pulmonary hypertension. The aortic root was mildly dilated to 4.1 cm. She followed up with Stephanie Bayley, NP, who noted she was still hypertensive and increase her amlodipine from 2.5-5 mg daily. She reports intolerance to lisinopril in the past and is also on metoprolol. Blood pressure remains elevated today 151/91 and she reports some lower extremity swelling and shortness of breath with exertion. She denies any further syncopal episodes.  11/16/2017   Stephanie Reyes returns today for follow-up. She reports feeling well. Her edema has improved on chlorthalidone, but her right foot still swells some. She has better BP control in general. She is still very active, despite almost turning 89. She denies chest pain or dyspnea.   04/05/2018  Stephanie Reyes returns today for follow-up.  Fortunately she recently had a fall.  She did not syncopized although felt dizzy.  She fractured her left hip and underwent hemi-arthroplasty.  Since then she has been recovering well although had problems with orthostatic hypotension.  In the hospital Dr. Marlou Reyes stopped her metoprolol, amlodipine and chlorthalidone.  Blood pressure remained low when she was placed on midodrine.  She was instructed to take this twice daily although blood pressures were quite elevated on the midodrine at 454-098 systolic over 11-914 diastolic.  She  was then instructed to come off the medication and use it as needed.  Since then her blood pressures have been essentially normal with only occasional blood pressures in the 90s for which she was given the medication.  She remains asymptomatic and is not had no syncopal episodes.  08/07/2019  Stephanie Reyes is seen today in follow-up.  I am pleased to report she has had no further syncopal episodes.  She says she is only used about 5 midodrine pills over the past year.  She brought in blood pressures which indicate her blood pressures typically in the 782N systolic over 56O and 13Y.  She did have one episode with a blood pressure was at 85/49.  She took midodrine and noted marked rapid improvement in her blood pressure.  Overall she is very pleased at how she is feeling and happy to be off medication.  07/13/2022  Stephanie Reyes is seen today for follow-up.  I had discussed her symptoms with her primary care provider.  She was having issues with hypotension and dizziness.  She was also noted to be bradycardic.  I advise stopping her diltiazem but recommended she remain on amiodarone.  She is in sinus rhythm today although she has some tremor artifact.  Blood pressure is improved at 130/70.  She has lost some additional weight now down to 103 pounds.  She says her appetite though is still pretty good.  She gets some occasional diarrhea about every 6 weeks of unknown etiology.  PMHx:  Past Medical History:  Diagnosis Date   Essential hypertension    Premature atrial contractions    Syncope    a.  02/2017 Echo: EF 55-60%, mild LVH, no rwma, Gr1 DD, triv AI, mildly dil Ao root, mild MR, mod TR, PASP 62mHg.    Past Surgical History:  Procedure Laterality Date   ANTERIOR APPROACH HEMI HIP ARTHROPLASTY Left 02/09/2018   Procedure: ANTERIOR APPROACH HEMI HIP ARTHROPLASTY;  Surgeon: SRod Can MD;  [facility]  Service: Orthopedics;  Laterality: Left;   CARDIOVERSION N/A 08/29/2021   Procedure:  CARDIOVERSION;  Surgeon: HPixie Casino MD;  [facility];  Service: Cardiovascular;  Laterality: N/A;   HERNIA REPAIR     TONSILLECTOMY      FAMHx:  Family History  Problem Relation Age of Onset   Hypertension Mother     SOCHx:   reports that she has never smoked. She has never used smokeless tobacco. She reports that she does not drink alcohol and does not use drugs.  ALLERGIES:  Allergies  Allergen Reactions   Fluvirin  [Influenza Virus Vaccine] Anaphylaxis   Aspirin Nausea And Vomiting and Swelling   Clindamycin/Lincomycin Nausea And Vomiting   Hydrocodone Nausea And Vomiting    headache   Penicillins Nausea And Vomiting and Swelling   Ampicillin Nausea And Vomiting   Eggs Or Egg-Derived Products     Egg whites Reports not being able to use albumin Makes pt feel ill    ROS: Pertinent items noted in HPI and remainder of comprehensive ROS otherwise negative.  HOME MEDS: Current Outpatient Medications on File Prior to Visit  Medication Sig Dispense Refill   acetaminophen (TYLENOL) 325 MG tablet Take 325 mg by mouth every 6 (six) hours as needed for moderate pain or headache.     amiodarone (PACERONE) 200 MG tablet TAKE 1 TABLET BY MOUTH EVERY DAY 90 tablet 2   apixaban (ELIQUIS) 2.5 MG TABS tablet Take 1 tablet (2.5 mg total) by mouth 2 (two) times daily. 60 tablet 5   atorvastatin (LIPITOR) 10 MG tablet Take 1 tablet (10 mg total) by mouth daily. 30 tablet 5   docusate calcium (SURFAK) 240 MG capsule Take 240 mg by mouth daily.     furosemide (LASIX) 20 MG tablet TAKE 1 TABLET BY MOUTH EVERY DAY 90 tablet 3   hydrocortisone cream 1 % Apply 1 application topically 2 (two) times daily as needed for itching.     Multiple Vitamin (MULTIVITAMIN WITH MINERALS) TABS tablet Take 1 tablet by mouth every other day.     No current facility-administered medications on file prior to visit.    LABS/IMAGING: No results found for this or any previous visit (from the  past 48 hour(s)). No results found.  LIPID PANEL:    Component Value Date/Time   CHOL 141 07/28/2021 0430   TRIG 44 07/28/2021 0430   HDL 59 07/28/2021 0430   CHOLHDL 2.4 07/28/2021 0430   VLDL 9 07/28/2021 0430   LDLCALC 73 07/28/2021 0430     WEIGHTS: Wt Readings from Last 3 Encounters:  07/13/22 96 lb (43.5 kg)  01/27/22 111 lb (50.3 kg)  09/22/21 113 lb 6.4 oz (51.4 kg)    VITALS: Ht 5' 5"  (1.651 m)   Wt 96 lb (43.5 kg)   BMI 15.98 kg/m   EXAM: General appearance: alert and no distress Neck: no carotid bruit, no JVD and thyroid not enlarged, symmetric, no tenderness/mass/nodules Lungs: clear to auscultation bilaterally Heart: regular rate and rhythm, S1, S2 normal, no murmur, click, rub or gallop Abdomen: soft, non-tender; bowel sounds normal; no masses,  no organomegaly Extremities: extremities normal, atraumatic,  no cyanosis or edema Pulses: 2+ and symmetric Skin: Skin color, texture, turgor normal. No rashes or lesions Neurologic: Grossly normal Psych: Pleasant  EKG: Sinus rhythm with PACs at 73, LVH by voltage--personally reviewed  ASSESSMENT: Chronic systolic heart failure, LVEF 45-50%-> improved to 55 to 60% (07/2021), moderate biatrial enlargement History of syncope Orthostatic hypotension Paroxysmal atrial fibrillation on amiodarone Dilated ascending aorta to 41 mm -> this measured 44 mm in 07/2021 Hypertension  PLAN: 1.   Stephanie Reyes seems to be doing better after recently stopping her diltiazem.  She is on amiodarone and is maintaining sinus rhythm.  She does have some lability to her blood pressures but has had no syncope.  Her aorta is dilated however she does not want to have any repeat echocardiograms to follow this.  Given her age I think this is reasonable.  No changes to her medicines today.  Follow-up in 6 months.  Pixie Casino, MD, [facility], [place] Director of the Advanced Lipid Disorders &   Cardiovascular Risk Reduction Clinic Diplomate of the American Board of Clinical Lipidology Attending Cardiologist  Direct Dial: [phone]  Fax: [phone]  Website:  [email or web address] 07/13/2022, 3:14 PM

## 2022-07-13 NOTE — Patient Instructions (Addendum)
Medication Instructions:  NO CHANGES  *If you need a refill on your cardiac medications before your next appointment, please call your pharmacy*    Follow-Up: At [facility], you and your health needs are our priority.  As part of our continuing mission to provide you with exceptional heart care, we have created designated Provider Care Teams.  These Care Teams include your primary Cardiologist (physician) and Advanced Practice Providers (APPs -  Physician Assistants and Nurse Practitioners) who all work together to provide you with the care you need, when you need it.  We recommend signing up for the patient portal called "MyChart".  Sign up information is provided on this After Visit Summary.  MyChart is used to connect with patients for Virtual Visits (Telemedicine).  Patients are able to view lab/test results, encounter notes, upcoming appointments, etc.  Non-urgent messages can be sent to your provider as well.   To learn more about what you can do with MyChart, go to [email or web address].    Your next appointment:    6 months with Dr. Hilty 

## 2022-08-04 ENCOUNTER — Other Ambulatory Visit: Payer: Self-pay | Admitting: Internal Medicine

## 2022-08-07 ENCOUNTER — Other Ambulatory Visit: Payer: Self-pay

## 2022-08-07 ENCOUNTER — Emergency Department (HOSPITAL_COMMUNITY): Payer: Medicare Other

## 2022-08-07 ENCOUNTER — Encounter (HOSPITAL_COMMUNITY): Payer: Self-pay | Admitting: Emergency Medicine

## 2022-08-07 ENCOUNTER — Inpatient Hospital Stay (HOSPITAL_COMMUNITY)
Admission: EM | Admit: 2022-08-07 | Discharge: 2022-09-06 | DRG: 177 | Disposition: E | Payer: Medicare Other | Attending: Internal Medicine | Admitting: Internal Medicine

## 2022-08-07 ENCOUNTER — Inpatient Hospital Stay (HOSPITAL_COMMUNITY): Payer: Medicare Other

## 2022-08-07 DIAGNOSIS — K5900 Constipation, unspecified: Secondary | ICD-10-CM

## 2022-08-07 DIAGNOSIS — Z8673 Personal history of transient ischemic attack (TIA), and cerebral infarction without residual deficits: Secondary | ICD-10-CM

## 2022-08-07 DIAGNOSIS — K449 Diaphragmatic hernia without obstruction or gangrene: Secondary | ICD-10-CM | POA: Diagnosis not present

## 2022-08-07 DIAGNOSIS — I480 Paroxysmal atrial fibrillation: Secondary | ICD-10-CM | POA: Diagnosis not present

## 2022-08-07 DIAGNOSIS — G931 Anoxic brain damage, not elsewhere classified: Secondary | ICD-10-CM | POA: Diagnosis not present

## 2022-08-07 DIAGNOSIS — Z515 Encounter for palliative care: Secondary | ICD-10-CM

## 2022-08-07 DIAGNOSIS — L89326 Pressure-induced deep tissue damage of left buttock: Secondary | ICD-10-CM | POA: Diagnosis present

## 2022-08-07 DIAGNOSIS — E872 Acidosis, unspecified: Secondary | ICD-10-CM | POA: Diagnosis present

## 2022-08-07 DIAGNOSIS — B179 Acute viral hepatitis, unspecified: Secondary | ICD-10-CM | POA: Diagnosis present

## 2022-08-07 DIAGNOSIS — Z681 Body mass index (BMI) 19 or less, adult: Secondary | ICD-10-CM

## 2022-08-07 DIAGNOSIS — J189 Pneumonia, unspecified organism: Principal | ICD-10-CM

## 2022-08-07 DIAGNOSIS — Z91012 Allergy to eggs: Secondary | ICD-10-CM

## 2022-08-07 DIAGNOSIS — Z885 Allergy status to narcotic agent status: Secondary | ICD-10-CM

## 2022-08-07 DIAGNOSIS — E039 Hypothyroidism, unspecified: Secondary | ICD-10-CM | POA: Diagnosis not present

## 2022-08-07 DIAGNOSIS — Z66 Do not resuscitate: Secondary | ICD-10-CM | POA: Diagnosis not present

## 2022-08-07 DIAGNOSIS — R627 Adult failure to thrive: Secondary | ICD-10-CM | POA: Diagnosis not present

## 2022-08-07 DIAGNOSIS — J9601 Acute respiratory failure with hypoxia: Secondary | ICD-10-CM | POA: Diagnosis present

## 2022-08-07 DIAGNOSIS — R110 Nausea: Secondary | ICD-10-CM | POA: Diagnosis not present

## 2022-08-07 DIAGNOSIS — R6889 Other general symptoms and signs: Secondary | ICD-10-CM | POA: Diagnosis not present

## 2022-08-07 DIAGNOSIS — Z8249 Family history of ischemic heart disease and other diseases of the circulatory system: Secondary | ICD-10-CM

## 2022-08-07 DIAGNOSIS — I129 Hypertensive chronic kidney disease with stage 1 through stage 4 chronic kidney disease, or unspecified chronic kidney disease: Secondary | ICD-10-CM | POA: Diagnosis not present

## 2022-08-07 DIAGNOSIS — N1330 Unspecified hydronephrosis: Secondary | ICD-10-CM | POA: Diagnosis present

## 2022-08-07 DIAGNOSIS — Z880 Allergy status to penicillin: Secondary | ICD-10-CM

## 2022-08-07 DIAGNOSIS — K5641 Fecal impaction: Secondary | ICD-10-CM | POA: Diagnosis not present

## 2022-08-07 DIAGNOSIS — R0602 Shortness of breath: Secondary | ICD-10-CM | POA: Diagnosis not present

## 2022-08-07 DIAGNOSIS — U071 COVID-19: Principal | ICD-10-CM | POA: Diagnosis present

## 2022-08-07 DIAGNOSIS — E876 Hypokalemia: Secondary | ICD-10-CM | POA: Diagnosis present

## 2022-08-07 DIAGNOSIS — I10 Essential (primary) hypertension: Secondary | ICD-10-CM | POA: Diagnosis not present

## 2022-08-07 DIAGNOSIS — Z79899 Other long term (current) drug therapy: Secondary | ICD-10-CM

## 2022-08-07 DIAGNOSIS — K7689 Other specified diseases of liver: Secondary | ICD-10-CM | POA: Diagnosis not present

## 2022-08-07 DIAGNOSIS — R531 Weakness: Secondary | ICD-10-CM | POA: Diagnosis not present

## 2022-08-07 DIAGNOSIS — I81 Portal vein thrombosis: Secondary | ICD-10-CM | POA: Diagnosis not present

## 2022-08-07 DIAGNOSIS — L89316 Pressure-induced deep tissue damage of right buttock: Secondary | ICD-10-CM | POA: Diagnosis present

## 2022-08-07 DIAGNOSIS — Z888 Allergy status to other drugs, medicaments and biological substances status: Secondary | ICD-10-CM

## 2022-08-07 DIAGNOSIS — K769 Liver disease, unspecified: Secondary | ICD-10-CM | POA: Diagnosis present

## 2022-08-07 DIAGNOSIS — D696 Thrombocytopenia, unspecified: Secondary | ICD-10-CM | POA: Diagnosis present

## 2022-08-07 DIAGNOSIS — D539 Nutritional anemia, unspecified: Secondary | ICD-10-CM | POA: Diagnosis present

## 2022-08-07 DIAGNOSIS — R339 Retention of urine, unspecified: Secondary | ICD-10-CM | POA: Diagnosis not present

## 2022-08-07 DIAGNOSIS — R7401 Elevation of levels of liver transaminase levels: Secondary | ICD-10-CM | POA: Diagnosis present

## 2022-08-07 DIAGNOSIS — R7989 Other specified abnormal findings of blood chemistry: Secondary | ICD-10-CM | POA: Diagnosis not present

## 2022-08-07 DIAGNOSIS — J9811 Atelectasis: Secondary | ICD-10-CM | POA: Diagnosis not present

## 2022-08-07 DIAGNOSIS — K7200 Acute and subacute hepatic failure without coma: Secondary | ICD-10-CM | POA: Diagnosis not present

## 2022-08-07 DIAGNOSIS — N1832 Chronic kidney disease, stage 3b: Secondary | ICD-10-CM | POA: Diagnosis not present

## 2022-08-07 DIAGNOSIS — N281 Cyst of kidney, acquired: Secondary | ICD-10-CM | POA: Diagnosis not present

## 2022-08-07 DIAGNOSIS — J1282 Pneumonia due to coronavirus disease 2019: Secondary | ICD-10-CM | POA: Diagnosis not present

## 2022-08-07 DIAGNOSIS — Z886 Allergy status to analgesic agent status: Secondary | ICD-10-CM

## 2022-08-07 DIAGNOSIS — H9190 Unspecified hearing loss, unspecified ear: Secondary | ICD-10-CM | POA: Diagnosis not present

## 2022-08-07 DIAGNOSIS — J90 Pleural effusion, not elsewhere classified: Secondary | ICD-10-CM | POA: Diagnosis not present

## 2022-08-07 DIAGNOSIS — R0902 Hypoxemia: Secondary | ICD-10-CM | POA: Diagnosis not present

## 2022-08-07 DIAGNOSIS — Z881 Allergy status to other antibiotic agents status: Secondary | ICD-10-CM

## 2022-08-07 DIAGNOSIS — Z7901 Long term (current) use of anticoagulants: Secondary | ICD-10-CM

## 2022-08-07 DIAGNOSIS — Z96642 Presence of left artificial hip joint: Secondary | ICD-10-CM | POA: Diagnosis present

## 2022-08-07 DIAGNOSIS — R918 Other nonspecific abnormal finding of lung field: Secondary | ICD-10-CM | POA: Diagnosis not present

## 2022-08-07 DIAGNOSIS — Z743 Need for continuous supervision: Secondary | ICD-10-CM | POA: Diagnosis not present

## 2022-08-07 LAB — CBC
HCT: 35.7 % — ABNORMAL LOW (ref 36.0–46.0)
Hemoglobin: 11.9 g/dL — ABNORMAL LOW (ref 12.0–15.0)
MCH: 34.3 pg — ABNORMAL HIGH (ref 26.0–34.0)
MCHC: 33.3 g/dL (ref 30.0–36.0)
MCV: 102.9 fL — ABNORMAL HIGH (ref 80.0–100.0)
Platelets: 105 10*3/uL — ABNORMAL LOW (ref 150–400)
RBC: 3.47 MIL/uL — ABNORMAL LOW (ref 3.87–5.11)
RDW: 13.8 % (ref 11.5–15.5)
WBC: 7.1 10*3/uL (ref 4.0–10.5)
nRBC: 0 % (ref 0.0–0.2)

## 2022-08-07 LAB — HEPATIC FUNCTION PANEL
ALT: 2591 U/L — ABNORMAL HIGH (ref 0–44)
AST: 5582 U/L — ABNORMAL HIGH (ref 15–41)
Albumin: 3.2 g/dL — ABNORMAL LOW (ref 3.5–5.0)
Alkaline Phosphatase: 95 U/L (ref 38–126)
Bilirubin, Direct: 1.6 mg/dL — ABNORMAL HIGH (ref 0.0–0.2)
Indirect Bilirubin: 0.9 mg/dL (ref 0.3–0.9)
Total Bilirubin: 2.5 mg/dL — ABNORMAL HIGH (ref 0.3–1.2)
Total Protein: 6.1 g/dL — ABNORMAL LOW (ref 6.5–8.1)

## 2022-08-07 LAB — IRON AND TIBC
Iron: 101 ug/dL (ref 28–170)
Saturation Ratios: 37 % — ABNORMAL HIGH (ref 10.4–31.8)
TIBC: 271 ug/dL (ref 250–450)
UIBC: 170 ug/dL

## 2022-08-07 LAB — COMPREHENSIVE METABOLIC PANEL WITH GFR
ALT: 2262 U/L — ABNORMAL HIGH (ref 0–44)
AST: 4684 U/L — ABNORMAL HIGH (ref 15–41)
Albumin: 3.7 g/dL (ref 3.5–5.0)
Alkaline Phosphatase: 70 U/L (ref 38–126)
Anion gap: 13 (ref 5–15)
BUN: 17 mg/dL (ref 8–23)
CO2: 23 mmol/L (ref 22–32)
Calcium: 8.4 mg/dL — ABNORMAL LOW (ref 8.9–10.3)
Chloride: 99 mmol/L (ref 98–111)
Creatinine, Ser: 1.28 mg/dL — ABNORMAL HIGH (ref 0.44–1.00)
GFR, Estimated: 38 mL/min — ABNORMAL LOW (ref >60)
Glucose, Bld: 127 mg/dL — ABNORMAL HIGH (ref 70–99)
Potassium: 3 mmol/L — ABNORMAL LOW (ref 3.5–5.1)
Sodium: 135 mmol/L (ref 135–145)
Total Bilirubin: 2.4 mg/dL — ABNORMAL HIGH (ref 0.3–1.2)
Total Protein: 7 g/dL (ref 6.5–8.1)

## 2022-08-07 LAB — DIFFERENTIAL
Abs Immature Granulocytes: 0.03 10*3/uL (ref 0.00–0.07)
Basophils Absolute: 0 10*3/uL (ref 0.0–0.1)
Basophils Relative: 0 %
Eosinophils Absolute: 0 10*3/uL (ref 0.0–0.5)
Eosinophils Relative: 0 %
Immature Granulocytes: 0 %
Lymphocytes Relative: 2 %
Lymphs Abs: 0.2 10*3/uL — ABNORMAL LOW (ref 0.7–4.0)
Monocytes Absolute: 0.1 10*3/uL (ref 0.1–1.0)
Monocytes Relative: 2 %
Neutro Abs: 6.7 10*3/uL (ref 1.7–7.7)
Neutrophils Relative %: 96 %

## 2022-08-07 LAB — RESP PANEL BY RT-PCR (FLU A&B, COVID) ARPGX2
Influenza A by PCR: NEGATIVE
Influenza B by PCR: NEGATIVE
SARS Coronavirus 2 by RT PCR: POSITIVE — AB

## 2022-08-07 LAB — C-REACTIVE PROTEIN: CRP: 2.1 mg/dL — ABNORMAL HIGH (ref <1.0)

## 2022-08-07 LAB — RETICULOCYTES
Immature Retic Fract: 7.8 % (ref 2.3–15.9)
RBC.: 3.75 MIL/uL — ABNORMAL LOW (ref 3.87–5.11)
Retic Count, Absolute: 60.7 10*3/uL (ref 19.0–186.0)
Retic Ct Pct: 1.6 % (ref 0.4–3.1)

## 2022-08-07 LAB — FOLATE: Folate: 16.4 ng/mL (ref >5.9)

## 2022-08-07 LAB — HEPATITIS PANEL, ACUTE
HCV Ab: NONREACTIVE
Hep A IgM: NONREACTIVE
Hep B C IgM: NONREACTIVE
Hepatitis B Surface Ag: NONREACTIVE

## 2022-08-07 LAB — LACTIC ACID, PLASMA
Lactic Acid, Venous: 2.5 mmol/L (ref 0.5–1.9)
Lactic Acid, Venous: 3.5 mmol/L (ref 0.5–1.9)

## 2022-08-07 LAB — MAGNESIUM: Magnesium: 1.8 mg/dL (ref 1.7–2.4)

## 2022-08-07 LAB — PROTIME-INR
INR: 3.1 — ABNORMAL HIGH (ref 0.8–1.2)
Prothrombin Time: 32.1 seconds — ABNORMAL HIGH (ref 11.4–15.2)

## 2022-08-07 LAB — VITAMIN B12: Vitamin B-12: 5636 pg/mL — ABNORMAL HIGH (ref 180–914)

## 2022-08-07 LAB — CK: Total CK: 71 U/L (ref 38–234)

## 2022-08-07 LAB — SALICYLATE LEVEL: Salicylate Lvl: <7 mg/dL — ABNORMAL LOW (ref 7.0–30.0)

## 2022-08-07 LAB — ACETAMINOPHEN LEVEL: Acetaminophen (Tylenol), Serum: <10 ug/mL — ABNORMAL LOW (ref 10–30)

## 2022-08-07 LAB — AMMONIA: Ammonia: 34 umol/L (ref 9–35)

## 2022-08-07 LAB — FERRITIN: Ferritin: >7500 ng/mL — ABNORMAL HIGH (ref 11–307)

## 2022-08-07 LAB — TSH: TSH: 25.293 u[IU]/mL — ABNORMAL HIGH (ref 0.350–4.500)

## 2022-08-07 MED ORDER — ONDANSETRON HCL 4 MG/2ML IJ SOLN: 4.0000 mg | Freq: Four times a day (QID) | INTRAMUSCULAR | Status: DC | PRN

## 2022-08-07 MED ORDER — METHYLPREDNISOLONE SODIUM SUCC 40 MG IJ SOLR
0.5000 mg/kg | Freq: Two times a day (BID) | INTRAMUSCULAR | Status: DC
  Administered 2022-08-08 – 2022-08-09 (×5): 21.6 mg via INTRAVENOUS
  Filled 2022-08-07 (×5): qty 1

## 2022-08-07 MED ORDER — ZINC SULFATE 220 (50 ZN) MG PO CAPS
220.0000 mg | ORAL_CAPSULE | Freq: Every day | ORAL | Status: DC
  Administered 2022-08-08 – 2022-08-09 (×2): 220 mg via ORAL
  Filled 2022-08-07 (×2): qty 1

## 2022-08-07 MED ORDER — VITAMIN C 500 MG PO TABS
500.0000 mg | ORAL_TABLET | Freq: Every day | ORAL | Status: DC
  Administered 2022-08-08 – 2022-08-09 (×2): 500 mg via ORAL
  Filled 2022-08-07 (×2): qty 1

## 2022-08-07 MED ORDER — SODIUM CHLORIDE 0.9 % IV SOLN
2.0000 g | INTRAVENOUS | Status: DC
  Administered 2022-08-08 – 2022-08-09 (×2): 2 g via INTRAVENOUS
  Filled 2022-08-07: qty 20

## 2022-08-07 MED ORDER — SODIUM CHLORIDE 0.9 % IV SOLN
1.0000 g | Freq: Once | INTRAVENOUS | Status: AC
  Administered 2022-08-07: 1 g via INTRAVENOUS
  Filled 2022-08-07: qty 10

## 2022-08-07 MED ORDER — SODIUM CHLORIDE 0.9 % IV BOLUS
500.0000 mL | Freq: Once | INTRAVENOUS | Status: AC
  Administered 2022-08-07: 500 mL via INTRAVENOUS

## 2022-08-07 MED ORDER — GUAIFENESIN-DM 100-10 MG/5ML PO SYRP: 10.0000 mL | ORAL_SOLUTION | ORAL | Status: DC | PRN

## 2022-08-07 MED ORDER — IPRATROPIUM-ALBUTEROL 20-100 MCG/ACT IN AERS
2.0000 | INHALATION_SPRAY | Freq: Four times a day (QID) | RESPIRATORY_TRACT | Status: DC
  Administered 2022-08-07 – 2022-08-09 (×6): 2 via RESPIRATORY_TRACT
  Filled 2022-08-07: qty 4

## 2022-08-07 MED ORDER — KCL IN DEXTROSE-NACL 20-5-0.9 MEQ/L-%-% IV SOLN
INTRAVENOUS | Status: AC
  Filled 2022-08-07 (×3): qty 1000

## 2022-08-07 MED ORDER — SODIUM CHLORIDE 0.9 % IV SOLN: 200.0000 mg | Freq: Once | INTRAVENOUS | Status: DC

## 2022-08-07 MED ORDER — ONDANSETRON HCL 4 MG PO TABS: 4.0000 mg | ORAL_TABLET | Freq: Four times a day (QID) | ORAL | Status: DC | PRN

## 2022-08-07 MED ORDER — POLYETHYLENE GLYCOL 3350 17 G PO PACK
17.0000 g | PACK | Freq: Two times a day (BID) | ORAL | Status: DC
  Filled 2022-08-07 (×2): qty 1

## 2022-08-07 MED ORDER — POTASSIUM CHLORIDE 20 MEQ PO PACK: 40.0000 meq | PACK | Freq: Once | ORAL | Status: DC

## 2022-08-07 MED ORDER — SODIUM CHLORIDE 0.9 % IV SOLN
500.0000 mg | INTRAVENOUS | Status: DC
  Administered 2022-08-08 – 2022-08-09 (×2): 500 mg via INTRAVENOUS
  Filled 2022-08-07 (×2): qty 5

## 2022-08-07 MED ORDER — PREDNISONE 50 MG PO TABS: 50.0000 mg | ORAL_TABLET | Freq: Every day | ORAL | Status: DC

## 2022-08-07 MED ORDER — SODIUM CHLORIDE 0.9 % IV SOLN
500.0000 mg | Freq: Once | INTRAVENOUS | Status: AC
  Administered 2022-08-07: 500 mg via INTRAVENOUS
  Filled 2022-08-07: qty 5

## 2022-08-07 MED ORDER — POTASSIUM CHLORIDE 10 MEQ/100ML IV SOLN
10.0000 meq | INTRAVENOUS | Status: AC
  Administered 2022-08-08 (×4): 10 meq via INTRAVENOUS
  Filled 2022-08-07 (×4): qty 100

## 2022-08-07 MED ORDER — SORBITOL 70 % SOLN
960.0000 mL | TOPICAL_OIL | Freq: Once | ORAL | Status: DC
  Filled 2022-08-07: qty 240

## 2022-08-07 MED ORDER — SODIUM CHLORIDE 0.9 % IV SOLN: 100.0000 mg | Freq: Every day | INTRAVENOUS | Status: DC

## 2022-08-07 MED ORDER — SODIUM CHLORIDE 0.9 % IV SOLN
100.0000 mg | INTRAVENOUS | Status: DC
  Filled 2022-08-07 (×2): qty 20

## 2022-08-07 MED ORDER — FLEET ENEMA 7-19 GM/118ML RE ENEM
1.0000 | ENEMA | Freq: Once | RECTAL | Status: AC
  Administered 2022-08-07: 1 via RECTAL
  Filled 2022-08-07: qty 1

## 2022-08-07 MED ORDER — NIRMATRELVIR/RITONAVIR (PAXLOVID)TABLET: 3.0000 | ORAL_TABLET | Freq: Two times a day (BID) | ORAL | Status: DC

## 2022-08-07 NOTE — ED Notes (Signed)
Foley cath attempted with no success

## 2022-08-07 NOTE — ED Notes (Signed)
Foley catheter team at bedside

## 2022-08-07 NOTE — ED Provider Notes (Addendum)
[facility]-EMERGENCY DEPT Provider Note   CSN: [id number] Arrival date & time: 09-Aug-2022  1012     History  Chief Complaint  Patient presents with   Weakness   Nausea    Stephanie Reyes is a 88 y.o. female.  HPI Patient presents with her daughter, via EMS.  History is obtained by the patient, daughter, EMS.  Patient has multiple medical problems, was generally well until a few days ago when she started feeling sick, with inability to take oral, with nausea, vomiting, generalized weakness.  Per EMS patient was hypoxic, 89% on room air, this improved with 4 L via nasal cannula.    Home Medications Prior to Admission medications   Medication Sig Start Date End Date Taking? Authorizing Provider  acetaminophen (TYLENOL) 325 MG tablet Take 325 mg by mouth every 6 (six) hours as needed for moderate pain or headache.    [provider]  amiodarone (PACERONE) 200 MG tablet TAKE 1 TABLET BY MOUTH EVERY DAY 06/07/22   Hilty, Lisette Abu, MD  apixaban (ELIQUIS) 2.5 MG TABS tablet Take 1 tablet (2.5 mg total) by mouth 2 (two) times daily. 02/20/22   Hilty, Lisette Abu, MD  atorvastatin (LIPITOR) 10 MG tablet TAKE 1 TABLET BY MOUTH EVERY DAY 08/04/22   Hilty, Lisette Abu, MD  docusate calcium (SURFAK) 240 MG capsule Take 240 mg by mouth daily.    [provider]  furosemide (LASIX) 20 MG tablet TAKE 1 TABLET BY MOUTH EVERY DAY 03/09/22   Hilty, Lisette Abu, MD  hydrocortisone cream 1 % Apply 1 application topically 2 (two) times daily as needed for itching.    [provider]  Multiple Vitamin (MULTIVITAMIN WITH MINERALS) TABS tablet Take 1 tablet by mouth every other day.    [provider]      Allergies    Fluvirin  [influenza virus vaccine], Aspirin, Clindamycin/lincomycin, Hydrocodone, Penicillins, Ampicillin, and Eggs or egg-derived products    Review of Systems   Review of Systems  All other systems reviewed and are negative.   Physical  Exam Updated Vital Signs BP (!) 179/77   Pulse 64   Temp 98.6 F (37 C) (Oral)   Resp 16   SpO2 96%  Physical Exam Vitals and nursing note reviewed.  Constitutional:      General: She is not in acute distress.    Appearance: She is well-developed. She is ill-appearing.     Comments: Frail-appearing elderly female awake and alert  HENT:     Head: Normocephalic and atraumatic.  Eyes:     Conjunctiva/sclera: Conjunctivae normal.  Cardiovascular:     Rate and Rhythm: Normal rate and regular rhythm.  Pulmonary:     Effort: Tachypnea and accessory muscle usage present. No respiratory distress.     Breath sounds: Decreased air movement present. No stridor.  Chest:    Abdominal:     General: There is no distension.    Skin:    General: Skin is warm and dry.  Neurological:     Mental Status: She is alert and oriented to person, place, and time.     Cranial Nerves: No cranial nerve deficit.  Psychiatric:        Mood and Affect: Mood normal.     ED Results / Procedures / Treatments   Labs (all labs ordered are listed, but only abnormal results are displayed) Labs Reviewed  CBC - Abnormal; Notable for the following components:      Result Value  RBC 3.47 (*)    Hemoglobin 11.9 (*)    HCT 35.7 (*)    MCV 102.9 (*)    MCH 34.3 (*)    Platelets 105 (*)    All other components within normal limits  LACTIC ACID, PLASMA - Abnormal; Notable for the following components:   Lactic Acid, Venous 2.5 (*)    All other components within normal limits  RESP PANEL BY RT-PCR (FLU A&B, COVID) [id number]  LACTIC ACID, PLASMA  COMPREHENSIVE METABOLIC PANEL    EKG EKG Interpretation  Date/Time:  Monday August 07 2022 10:32:51 EDT Ventricular Rate:  61 PR Interval:  187 QRS Duration: 132 QT Interval:  469 QTC Calculation: 473 R Axis:   -60 Text Interpretation: Sinus rhythm Left bundle branch block Abnormal ECG Confirmed by Carmin Muskrat [phone]) on 08/29/2022 11:01:33  AM  Radiology CT ABDOMEN PELVIS WO CONTRAST  Result Date: 08/22/2022 CLINICAL DATA:  Bowel obstruction suspected. Lethargy and generalized weakness since yesterday. Nausea, vomiting. EXAM: CT ABDOMEN AND PELVIS WITHOUT CONTRAST TECHNIQUE: Multidetector CT imaging of the abdomen and pelvis was performed following the standard protocol without IV contrast. RADIATION DOSE REDUCTION: This exam was performed according to the departmental dose-optimization program which includes automated exposure control, adjustment of the mA and/or kV according to patient size and/or use of iterative reconstruction technique. COMPARISON:  None Available. FINDINGS: Lower chest: Heart is enlarged. Coronary artery calcifications are present. No pericardial effusion. Large hiatal hernia. Hepatobiliary: Liver parenchyma is hyperdense, consistent with prior amiodarone treatment. Sub centimeter cysts are noted in the liver, largest in the LEFT hepatic lobe measuring 0.8 centimeters. No further imaging is recommended. No suspicious liver lesion. Gallbladder is present and unremarkable. Pancreas: Unremarkable. No pancreatic ductal dilatation or surrounding inflammatory changes. Spleen: Normal in size without focal abnormality. Adrenals/Urinary Tract: Normal adrenal glands. There is bilateral hydronephrosis to level of the mid ureters. No evidence for obstructing calculi. The urinary bladder is markedly distended. Stomach/Bowel: Large hiatal hernia. Stomach is otherwise normal. Small bowel loops are unremarkable. The appendix is well seen and normal in appearance. There is significant, consolidated appearing stool within the rectum, measuring 10.2 x 7.7 x 10.1 centimeters, consistent with impaction. Numerous colonic diverticular present. No evidence for acute diverticulitis or colonic mass. Vascular/Lymphatic: There is dense atherosclerotic calcification of the abdominal aorta. Aorta is tortuous but not aneurysmal. Reproductive: Uterus is  present.  No adnexal mass. Other: No abdominal wall hernia or abnormality. No abdominopelvic ascites. Musculoskeletal: LEFT hip arthroplasty. No evidence for acute abnormality. IMPRESSION: 1. Rectosigmoid stool impaction, measuring 10 centimeters. 2. Marked distention of the urinary bladder. 3. Bilateral hydronephrosis to the level of the mid ureters. The hydronephrosis may be related to marked distension of the urinary bladder. No evidence for obstructing calculi. 4. Large hiatal hernia. 5. Colonic diverticulosis without acute diverticulitis. 6. Hyperdense liver, consistent with prior amiodarone treatment. 7. Cardiomegaly. Coronary artery calcifications. Electronically Signed   By: Nolon Nations M.D.   On: 08/09/2022 11:33   DG Chest Port 1 View  Result Date: 08/06/2022 CLINICAL DATA:  Shortness of breath, weakness EXAM: PORTABLE CHEST 1 VIEW COMPARISON:  07/27/2021 FINDINGS: Stable cardiomegaly. Aortic atherosclerosis. Hiatal hernia. Patchy opacity in the right upper lobe. Mild bibasilar atelectasis. No large pleural fluid collection. No pneumothorax. Osseous structures are demineralized. IMPRESSION: Patchy opacity in the right upper lobe suspicious for pneumonia. Electronically Signed   By: Davina Poke D.O.   On: 09/04/2022 11:06    Procedures Procedures    Medications Ordered in ED  Medications  azithromycin (ZITHROMAX) 500 mg in sodium chloride 0.9 % 250 mL IVPB (has no administration in time range)  sodium chloride 0.9 % bolus 500 mL (has no administration in time range)  sodium phosphate (FLEET) 7-19 GM/118ML enema 1 enema (has no administration in time range)  sodium chloride 0.9 % bolus 500 mL (500 mLs Intravenous New Bag/Given 09/03/2022 1143)  cefTRIAXone (ROCEPHIN) 1 g in sodium chloride 0.9 % 100 mL IVPB (1 g Intravenous New Bag/Given 08/06/2022 1221)    ED Course/ Medical Decision Making/ A&P This patient with a Hx of A-fib, hypertension, presents to the ED for concern of fatigue,  nausea, vomiting, hypoxia and abdominal pain, this involves an extensive number of treatment options, and is a complaint that carries with it a high risk of complications and morbidity.    The differential diagnosis includes pneumonia bacteremia, sepsis, bowel obstruction   Social Determinants of Health:  Advanced age  Additional history obtained:  Additional history and/or information obtained from as of EMS and daughter as included above  After the initial evaluation, orders, including: Labs x-ray monitoring supplemental oxygen were initiated.   Patient placed on Cardiac and Pulse-Oximetry Monitors. The patient was maintained on a cardiac monitor.  The cardiac monitored showed an rhythm of 60 sinus normal The patient was also maintained on pulse oximetry. The readings were typically 100% on 2 L nasal cannula abnormal   On repeat evaluation of the patient stayed the same  Lab Tests:  I personally interpreted labs.  The pertinent results include: Lactic acidosis 2.5, COVID-positive, hepatic dysfunction  Imaging Studies ordered:  I independently visualized and interpreted imaging which showed several abnormalities, most notably right sided pneumonia on x-ray.  CT scan demonstrates substantial urine retention, as well as stool retention. I agree with the radiologist interpretation, and I demonstrated the pictures to the patient's daughter at bedside  Consultations Obtained:  I requested consultation with the Foley catheter team as the patient was unable to tolerate catheter placed by nursing staff.  Catheter team was able to place Foley catheter without complication  4:25 PMDispostion / Final MDM: Patient is been going by another daughter.  At bedside with patient, 2 daughters, we discussed summary of today's abnormalities, all concerning given the patient's advanced age, comorbidities.  In essence, she has hepatic dysfunction, renal dysfunction, COVID, right lower lobe pneumonia,  constipation and urinary retention with a new oxygen requirement.  Patient does have preserved blood pressure, mentation, both of which are reassuring, but given the totality of her illness, she requires hospitalization.  I also discussed case with our pharmacists for appropriate remdesivir consideration.  Patient's case discussed at bedside with the admitting physician, Dr. Mellody Drown.    Notably, I also discussed goals of care with the patient's daughter, and with the patient herself.  Patient will be DNR, but efforts for current state of illness should continue.  Final Clinical Impression(s) / ED Diagnoses Final diagnoses:  Community acquired pneumonia of right middle lobe of lung  Constipation, unspecified constipation type  Urinary retention  COVID  Hepatic dysfunction     Gerhard Munch, MD 08/20/2022 1303    Gerhard Munch, MD 09/02/2022 1627

## 2022-08-07 NOTE — H&P (Signed)
[facility] History and Physical  Stephanie Reyes PYK:[id number] DOB: 1923-12-15 DOA: 08/17/2022   PCP: Glenis Smoker, MD  Specialists: Dr. Debara Pickett is her cardiologist  Chief Complaint: Fatigue, cough, poor oral intake ongoing for the past 3 days  HPI: Stephanie Reyes is a 88 y.o. female with a past medical history of paroxysmal atrial fibrillation on amiodarone and Eliquis, status post DC cardioversion in 2022, history of embolic stroke in 5397 essential hypertension, who was in her usual state of health until this past weekend when her daughters noted that the patient was not as active as she usually is.  She is quite functional at baseline.  She apparently developed a fever over the weekend and developed a cough.  Her oral intake diminished significantly.  So that they had to call EMS yesterday.  EMS came give her IV fluids with which she perked up.  However her symptoms persisted today and the family called EMS again who decided to bring her to the hospital.  History from the patient is very limited since she is lethargic and is very hard of hearing.  However she denies any abdominal pain chest pain or shortness of breath.  Her 2 daughters at the bedside.  Most of the history was obtained from them.  Denies any history of liver disease.  No new medications have been started within the last 1 month. No daily use of Tylenol.  In the emergency department she was noted to be positive for COVID-19.  Lactic acid level was noted to be elevated.  She had significantly elevated AST and ALT.  Family denies any history of alcohol intake.  Chest x-ray suggested right-sided pneumonia.  CT scan raised concern for urinary retention, constipation.  Patient also noted to be requiring oxygen.  She will be hospitalized for further management.  Home Medications: Prior to Admission medications   Medication Sig Start Date End Date Taking? Authorizing Provider  acetaminophen (TYLENOL) 325 MG tablet  Take 325 mg by mouth every 6 (six) hours as needed for moderate pain or headache.   Yes [provider]  amiodarone (PACERONE) 200 MG tablet TAKE 1 TABLET BY MOUTH EVERY DAY Patient taking differently: Take 200 mg by mouth daily. 06/07/22  Yes Hilty, Nadean Corwin, MD  apixaban (ELIQUIS) 2.5 MG TABS tablet Take 1 tablet (2.5 mg total) by mouth 2 (two) times daily. 02/20/22  Yes Hilty, Nadean Corwin, MD  atorvastatin (LIPITOR) 10 MG tablet TAKE 1 TABLET BY MOUTH EVERY DAY 08/04/22  Yes Hilty, Nadean Corwin, MD  docusate calcium (SURFAK) 240 MG capsule Take 240 mg by mouth daily.   Yes [provider]  furosemide (LASIX) 20 MG tablet TAKE 1 TABLET BY MOUTH EVERY DAY Patient taking differently: Take 20 mg by mouth daily. 03/09/22  Yes Hilty, Nadean Corwin, MD  hydrocortisone cream 1 % Apply 1 application topically 2 (two) times daily as needed for itching.   Yes [provider]  Multiple Vitamin (MULTIVITAMIN WITH MINERALS) TABS tablet Take 1 tablet by mouth every other day.   Yes [provider]  polyethylene glycol (MIRALAX / GLYCOLAX) 17 g packet Take 17 g by mouth daily as needed for mild constipation.   Yes [provider]    Allergies:  Allergies  Allergen Reactions   Fluvirin  [Influenza Virus Vaccine] Anaphylaxis   Aspirin Nausea And Vomiting and Swelling   Clindamycin/Lincomycin Nausea And Vomiting   Hydrocodone Nausea And Vomiting    headache   Penicillins Nausea And  Vomiting and Swelling   Ampicillin Nausea And Vomiting   Eggs Or Egg-Derived Products     Egg whites Reports not being able to use albumin Makes pt feel ill    Past Medical History: Past Medical History:  Diagnosis Date   Essential hypertension    Premature atrial contractions    Syncope    a. 02/2017 Echo: EF 55-60%, mild LVH, no rwma, Gr1 DD, triv AI, mildly dil Ao root, mild MR, mod TR, PASP 5mmHg.    Past Surgical History:  Procedure Laterality Date   ANTERIOR APPROACH HEMI HIP  ARTHROPLASTY Left 02/09/2018   Procedure: ANTERIOR APPROACH HEMI HIP ARTHROPLASTY;  Surgeon: Rod Can, MD;  Location: [place] [facility];  Service: Orthopedics;  Laterality: Left;   CARDIOVERSION N/A 08/29/2021   Procedure: CARDIOVERSION;  Surgeon: Pixie Casino, MD;  Location: [facility] ENDOSCOPY;  Service: Cardiovascular;  Laterality: N/A;   HERNIA REPAIR     TONSILLECTOMY      Social History: Patient lives by herself but her daughters are available for assistance.  But patient is able to get around on her own.  No history of smoking alcohol use.   Family History:  Family History  Problem Relation Age of Onset   Hypertension Mother      Review of Systems -unable to do due to patient's lethargy and hearing impairment  Physical Examination  Vitals:   08/09/2022 1305 08/06/2022 1409 08/30/2022 1500 08/16/2022 1600  BP: (!) 177/86 (!) 155/74 (!) 141/127 (!) 159/145  Pulse: 65 65 62 67  Resp: 18 18 18 18   Temp:   98.4 F (36.9 C)   TempSrc:      SpO2: 95% 95% 92% 93%    BP (!) 159/145   Pulse 67   Temp 98.4 F (36.9 C)   Resp 18   SpO2 93%   General appearance: alert, appears stated age, distracted, and fatigued Head: Normocephalic, without obvious abnormality, atraumatic Eyes: conjunctivae/corneas clear. PERRL, EOM's intact.  Throat: Dry mucous membranes noted. Neck: no adenopathy, no carotid bruit, no JVD, supple, symmetrical, trachea midline, and thyroid not enlarged, symmetric, no tenderness/mass/nodules Resp: Noted to be tachypneic.  Coarse breath sounds bilaterally with crackles at the bases.  No wheezing or rhonchi. Cardio: regular rate and rhythm, S1, S2 normal, no murmur, click, rub or gallop GI: soft, non-tender; bowel sounds normal; no masses,  no organomegaly Extremities: extremities normal, atraumatic, no cyanosis or edema Pulses: 2+ and symmetric Skin: There is 4 cm lesion on the right thigh which is nodular almost fungating with some bleeding.  Very suspicious for being  cancerous. Lymph nodes: Cervical, supraclavicular, and axillary nodes normal. Neurologic: Lethargic easily arousable.  No facial asymmetry.  Motor strength seems to be equal bilateral upper and lower extremities.    Labs on Admission: I have personally reviewed following labs and imaging studies  CBC: Recent Labs  Lab 08/09/2022 1137  WBC 7.1  HGB 11.9*  HCT 35.7*  MCV 102.9*  PLT 123456*   Basic Metabolic Panel: Recent Labs  Lab 08/11/2022 1238  NA 135  K 3.0*  CL 99  CO2 23  GLUCOSE 127*  BUN 17  CREATININE 1.28*  CALCIUM 8.4*   GFR: CrCl cannot be calculated (Unknown ideal weight.). Liver Function Tests: Recent Labs  Lab 08/18/2022 1238  AST 4,684*  ALT 2,262*  ALKPHOS 70  BILITOT 2.4*  PROT 7.0  ALBUMIN 3.7      Radiological Exams on Admission: CT ABDOMEN PELVIS WO CONTRAST  Result Date:  08/30/2022 CLINICAL DATA:  Bowel obstruction suspected. Lethargy and generalized weakness since yesterday. Nausea, vomiting. EXAM: CT ABDOMEN AND PELVIS WITHOUT CONTRAST TECHNIQUE: Multidetector CT imaging of the abdomen and pelvis was performed following the standard protocol without IV contrast. RADIATION DOSE REDUCTION: This exam was performed according to the departmental dose-optimization program which includes automated exposure control, adjustment of the mA and/or kV according to patient size and/or use of iterative reconstruction technique. COMPARISON:  None Available. FINDINGS: Lower chest: Heart is enlarged. Coronary artery calcifications are present. No pericardial effusion. Large hiatal hernia. Hepatobiliary: Liver parenchyma is hyperdense, consistent with prior amiodarone treatment. Sub centimeter cysts are noted in the liver, largest in the LEFT hepatic lobe measuring 0.8 centimeters. No further imaging is recommended. No suspicious liver lesion. Gallbladder is present and unremarkable. Pancreas: Unremarkable. No pancreatic ductal dilatation or surrounding inflammatory  changes. Spleen: Normal in size without focal abnormality. Adrenals/Urinary Tract: Normal adrenal glands. There is bilateral hydronephrosis to level of the mid ureters. No evidence for obstructing calculi. The urinary bladder is markedly distended. Stomach/Bowel: Large hiatal hernia. Stomach is otherwise normal. Small bowel loops are unremarkable. The appendix is well seen and normal in appearance. There is significant, consolidated appearing stool within the rectum, measuring 10.2 x 7.7 x 10.1 centimeters, consistent with impaction. Numerous colonic diverticular present. No evidence for acute diverticulitis or colonic mass. Vascular/Lymphatic: There is dense atherosclerotic calcification of the abdominal aorta. Aorta is tortuous but not aneurysmal. Reproductive: Uterus is present.  No adnexal mass. Other: No abdominal wall hernia or abnormality. No abdominopelvic ascites. Musculoskeletal: LEFT hip arthroplasty. No evidence for acute abnormality. IMPRESSION: 1. Rectosigmoid stool impaction, measuring 10 centimeters. 2. Marked distention of the urinary bladder. 3. Bilateral hydronephrosis to the level of the mid ureters. The hydronephrosis may be related to marked distension of the urinary bladder. No evidence for obstructing calculi. 4. Large hiatal hernia. 5. Colonic diverticulosis without acute diverticulitis. 6. Hyperdense liver, consistent with prior amiodarone treatment. 7. Cardiomegaly. Coronary artery calcifications. Electronically Signed   By: Nolon Nations M.D.   On: 08/09/2022 11:33   DG Chest Port 1 View  Result Date: 08/21/2022 CLINICAL DATA:  Shortness of breath, weakness EXAM: PORTABLE CHEST 1 VIEW COMPARISON:  07/27/2021 FINDINGS: Stable cardiomegaly. Aortic atherosclerosis. Hiatal hernia. Patchy opacity in the right upper lobe. Mild bibasilar atelectasis. No large pleural fluid collection. No pneumothorax. Osseous structures are demineralized. IMPRESSION: Patchy opacity in the right upper lobe  suspicious for pneumonia. Electronically Signed   By: Davina Poke D.O.   On: 08/16/2022 11:06    My interpretation of Electrocardiogram: Sinus rhythm in the 60s.  Left axis deviation.  No Q waves.  Nonspecific T wave changes.  No concerning ST segment changes.   Problem List  Principal Problem:   Pneumonia due to COVID-19 virus Active Problems:   AF (paroxysmal atrial fibrillation) (HCC)   Transaminitis   Acute hepatitis   Thrombocytopenia (HCC)   Macrocytic anemia   COVID-19 virus infection   Assessment: This is a 88 year old Caucasian female with past medical history as stated earlier who comes in with a few day history of lethargy shortness of breath poor oral intake.  She is found to be positive for COVID-19.  X-ray suggest pneumonia on the right side.  WBC is normal.  She has thrombocytopenia.  More concerningly she has significantly elevated AST and ALT.  There is no history of alcohol use.  No history of Tylenol use.  Significant transaminitis could be from viral infection.  Elevated  lactic acid most likely due to liver dysfunction.  She appears to be hemodynamically stable at this time.  Plan:  Acute Hypoxic Resp. Failure/Pneumonia due to COVID-19  Recent Labs  Lab 08/29/2022 1238  ALT 2,262*   Objective findings: Fever: Afebrile here.  She was running a fever at home. Oxygen requirements: Saturations were in the low 90s on room air.  Currently on nasal cannula oxygen.  Saturations in the early 90s.  COVID 19 Therapeutics: Antibacterials: Ceftriaxone and azithromycin will be given for total of 5 days Remdesivir: Cannot be given due to significant liver dysfunction Steroids: Solu-Medrol followed by prednisone Diuretics: None Inhaled Steroids: None yet Actemra/Baricitinib: None yet PUD Prophylaxis: Initiate Pepcid DVT Prophylaxis: SCDs for now She received the initial 2 doses of over 19 vaccine but has not received any booster.  As mentioned above we will treat  with steroids.  Cannot use Remdesivir due to significant hepatic dysfunction.  We will follow-up on inflammatory markers which have been ordered.  No clear indication to use Actemra which also appear to be contraindicated due to significant liver dysfunction. Inhalers as needed.  Incentive spirometer if she is able to.  Acute hepatitis Significant elevated AST ALT noted.  No hypotension noted.  CT of the abdomen and pelvis shows liver parenchymal changes suggestive of amiodarone use.  Some subcentimeter cysts were noted.  Gallbladder was absent.  No suspicious liver lesions were appreciated.   We will hold her amiodarone.  She is also on statin which will also be held.  We will check hepatitis panel.   Ammonia level will be checked.  Albumin is noted to be normal.   We will check PT/INR.  Acetaminophen level will be checked.   Trend transaminases.  Check bilirubin again tomorrow.   Alkaline phosphatase is normal.   Will order right upper quadrant ultrasound with Doppler studies. PCPs office was called and patient had normal liver function tests in July.  Failure to thrive/nausea and vomiting Symptoms most likely due to COVID-19.  Encourage oral intake.  IV fluids will be ordered  Lactic acidosis Most probably due to liver dysfunction.  History of paroxysmal atrial fibrillation Followed by Dr. Debara Pickett.  EF is noted to be normal on echocardiogram last year.  She is noted to be on Eliquis.  She underwent DC cardioversion in 2022.  She is currently in sinus rhythm.  Monitor on telemetry.  Due to low platelet counts we will hold her apixaban for now.  Consideration can be given to resuming apixaban in the next 24 to 48 hours depending on her stability. Amiodarone will be held due to liver dysfunction.  Macrocytic anemia Check anemia panel.  No evidence of overt bleeding.  Hypokalemia Will be repleted.  Check magnesium level.  Thrombocytopenia, mild Etiology unclear.  Check peripheral smear.   We will check LDH.  Acute urinary retention Foley catheter has been placed.  Consider initiating Flomax in the next 24 hours depending on stability.  Elevated creatinine, likely has chronic kidney disease stage IIIb Renal dysfunction could also be due to urinary retention.  CT scan did suggest bilateral hydronephrosis which is most likely due to urinary retention. According to primary care physician's office her creatinine was 1.48 back in July.  Severe constipation with fecal impaction We will check TSH.  She has been given an enema.  Will initiate bowel regimen.  May need disimpaction.   History of embolic stroke in [id number] Apixaban currently on hold due to high risk of bleeding.  Consider  resuming in the next 24 to 48 hours depending on stability.  Concern for skin cancer Patient has a very suspicious appearing lesion in her lateral right thigh.  Will need to be addressed in the outpatient setting.  Goals of care Discussed in detail with patient's 2 daughters were at the bedside.  Guarded prognosis was conveyed to them.  We will plan to treat acute illness without being too aggressive.  No heroic measures.  No pressors if she were to become hypotensive.  If patient were to decline focus should be on comfort.  Patient will be DNR.  Continue active treatments for now.  DVT Prophylaxis: SCDs Code Status: DNR Family Communication: Discussed with patient's daughters Disposition: To be determined Consults called: None Admission Status: Status is: Inpatient Remains inpatient appropriate because: Pneumonia due to COVID-19, acute hepatitis    Severity of Illness: The appropriate patient status for this patient is INPATIENT. Inpatient status is judged to be reasonable and necessary in order to provide the required intensity of service to ensure the patient's safety. The patient's presenting symptoms, physical exam findings, and initial radiographic and laboratory data in the context of their  chronic comorbidities is felt to place them at high risk for further clinical deterioration. Furthermore, it is not anticipated that the patient will be medically stable for discharge from the hospital within 2 midnights of admission.   * I certify that at the point of admission it is my clinical judgment that the patient will require inpatient hospital care spanning beyond 2 midnights from the point of admission due to high intensity of service, high risk for further deterioration and high frequency of surveillance required.*   Further management decisions will depend on results of further testing and patient's response to treatment.   Stephanie Reyes [employer]  [facility] operational officer on [email or web address]  08/06/2022, 5:07 PM

## 2022-08-07 NOTE — ED Triage Notes (Signed)
Pt BIB EMS from home, c/o lethargy and generalized weakness since yesterday. Per daughter, pt N/V and unable to ambulate and feeling increasing weakness. Placed on 4 liters O2 on arrival, sats 89% RA  BP 170/80 P 65 spO2 97% 4 liters CBG 150

## 2022-08-08 DIAGNOSIS — K5900 Constipation, unspecified: Secondary | ICD-10-CM | POA: Diagnosis not present

## 2022-08-08 DIAGNOSIS — U071 COVID-19: Secondary | ICD-10-CM | POA: Diagnosis not present

## 2022-08-08 DIAGNOSIS — I480 Paroxysmal atrial fibrillation: Secondary | ICD-10-CM | POA: Diagnosis not present

## 2022-08-08 DIAGNOSIS — B179 Acute viral hepatitis, unspecified: Secondary | ICD-10-CM | POA: Diagnosis not present

## 2022-08-08 MED ORDER — SODIUM CHLORIDE 0.9 % IV BOLUS
500.0000 mL | Freq: Once | INTRAVENOUS | Status: AC
  Administered 2022-08-08: 500 mL via INTRAVENOUS

## 2022-08-08 MED ORDER — HALOPERIDOL LACTATE 5 MG/ML IJ SOLN
0.5000 mg | Freq: Four times a day (QID) | INTRAMUSCULAR | Status: DC | PRN
  Administered 2022-08-08: 1 mg via INTRAVENOUS
  Filled 2022-08-08 (×2): qty 1

## 2022-08-08 MED ORDER — SODIUM CHLORIDE 0.9 % IV BOLUS
250.0000 mL | Freq: Once | INTRAVENOUS | Status: AC
  Administered 2022-08-08: 250 mL via INTRAVENOUS

## 2022-08-08 MED ORDER — LIP MEDEX EX OINT
TOPICAL_OINTMENT | CUTANEOUS | Status: DC | PRN
  Filled 2022-08-08: qty 7

## 2022-08-08 MED ORDER — SODIUM CHLORIDE 0.9 % IV BOLUS: 500.0000 mL | Freq: Once | INTRAVENOUS | Status: DC

## 2022-08-08 MED ORDER — FENTANYL CITRATE PF 50 MCG/ML IJ SOSY
12.5000 ug | PREFILLED_SYRINGE | Freq: Once | INTRAMUSCULAR | Status: AC
  Administered 2022-08-08: 12.5 ug via INTRAVENOUS
  Filled 2022-08-08: qty 1

## 2022-08-08 NOTE — Progress Notes (Addendum)
[facility] PROGRESS NOTE   Stephanie Reyes [id number] DOB: [birth year] DOA: 08/19/2022  PCP: Glenis Smoker, MD  Brief History/Interval Summary: 88 y.o. female with a past medical history of paroxysmal atrial fibrillation on amiodarone and Eliquis, status post DC cardioversion in 2022, history of embolic stroke in 123456 essential hypertension, who was in her usual state of health until this past weekend when her daughters noted that the patient was not as active as she usually is.  She is quite functional at baseline.  She apparently developed a fever over the weekend and developed a cough.  Her oral intake diminished significantly.  So that they had to call EMS yesterday.  EMS came give her IV fluids with which she perked up.  However her symptoms persisted today and the family called EMS again who decided to bring her to the hospital.  Found to be positive for COVID-19.  Found to have significant transaminitis.  Hospitalized for further management.   Consultants: None  Procedures: None    Subjective/Interval History: Overnight events noted.  Patient had a drop in her blood pressure.  She was noted to have agitation.  Seen today and noted to be more lethargic compared to last night but easily arousable.  Both her daughters are at the bedside.  Difficult obtain much information due to her lethargy and hearing impairment.    Assessment/Plan:  Acute Hypoxic Resp. Failure/Pneumonia due to COVID-19   Recent Labs  Lab 09/04/2022 1238 08/11/2022 1903 08/27/2022 1904  FERRITIN  --   --  >7,500*  CRP  --   --  2.1*  ALT 2,262* 2,591*  --     Objective findings: Fever: Has been afebrile in the hospital but she was apparently running fever at home.   Oxygen requirements: Noted to be saturating in the 90s but noted to be on Ventimask.     COVID 19 Therapeutics: Antibacterials: Ceftriaxone and azithromycin will be given for total of 5 days Remdesivir: Cannot be given due to  significant liver dysfunction Steroids: Solu-Medrol followed by prednisone Diuretics: None Inhaled Steroids: None yet Actemra/Baricitinib: None yet PUD Prophylaxis: Initiate Pepcid DVT Prophylaxis: SCDs for now She received the initial 2 doses of over 19 vaccine but has not received any booster.   Patient's respiratory status appears to have worsened overnight.  She is requiring a Ventimask.  CRP however is not that significantly elevated at only 2.1.  Cannot give Actemra due to significant hepatic dysfunction.  Cannot use Remdesivir for same reason.  Continue just with steroids.  Incentive spirometer, inhalers as needed.   Acute hepatitis PCPs office was called and patient had normal liver function tests in July. Significant elevated AST ALT noted.  No hypotension noted.  CT of the abdomen and pelvis shows liver parenchymal changes suggestive of amiodarone use.  Some subcentimeter cysts were noted.  Gallbladder was absent.  No suspicious liver lesions were appreciated.   LFTs were repeated and continued to show elevated AST and ALT which appears to be worsening.  Labs from this morning are pending. Amiodarone and statin were held.  CK level noted to be normal. INR is noted to be elevated at 3.1.  Albumin level was noted to be 3.2. Liver Doppler was unremarkable.  Ammonia level was normal.   Acetaminophen level was normal.  Etiology is unclear but probably multifactorial and as a result of infection, amiodarone and statin. Prognosis is is guarded.   Failure to thrive/nausea and vomiting Symptoms most likely due to  COVID-19.  Encourage oral intake.  Can continue IV fluids   Lactic acidosis Most probably due to liver dysfunction.   History of paroxysmal atrial fibrillation Followed by Dr. Debara Pickett.  EF is noted to be normal on echocardiogram last year.  She is noted to be on Eliquis.  She underwent DC cardioversion in 2022.  She is currently in sinus rhythm.  Monitor on telemetry.   Apixaban  was held due to thrombocytopenia.  Amiodarone held due to liver dysfunction.     Macrocytic anemia No dense overt bleeding.  Anemia panel reviewed.  No deficiencies identified.  Hypokalemia Was repleted.  Magnesium level 1.8.  Repeat labs are pending from this morning.     Thrombocytopenia, mild Etiology unclear.  Waiting on labs from this morning.   Acute urinary retention Foley catheter has been placed.  Consider Flomax depending on how she does over the next 24 to 48 hours.   Elevated creatinine, likely has chronic kidney disease stage IIIb Renal dysfunction could also be due to urinary retention.  CT scan did suggest bilateral hydronephrosis which is most likely due to urinary retention. According to primary care physician's office her creatinine was 1.48 back in July. Labs are pending from this morning.   Severe constipation with fecal impaction Given enema with good response.  Continue bowel regimen.  Hypothyroidism TSH noted to be elevated at 25.29.  She was on amiodarone which could be the reason for her thyroid dysfunction.  Consider treatment depending on how she does over the next few days.   History of embolic stroke in [id number] Apixaban currently on hold due to high risk of bleeding.  Consider resuming in the next 24 to 48 hours depending on stability.   Concern for skin cancer Patient has a very suspicious appearing lesion in her lateral right thigh.  Will need to be addressed in the outpatient setting.   Goals of care Patient is DNR based on my conversation with the patient's daughters yesterday.  Both the daughters are again present today.  Patient's third daughter and her son are en route to the hospital.  They will be arriving by tonight.   Patient has expressed to her daughters that she would not want any of the intervention she is currently receiving.  That this was discussed in detail with the patient and the daughter.  Patient would want to wait for her third  daughter to arrive before stopping treatment.  Both the daughters in the room understand the severity of her illness but they want to respect patient's wishes.  So the plan will be to de-escalate care once the other children arrive.  If in the meantime she were to worsen then the daughters are okay with transition to comfort at that time.  No escalation of care beyond what she is already receiving at this time.    Patient reevaluated this afternoon.  Informed by nursing staff that patient's blood work from this morning got canceled by the lab for no clear reason.  Patient and family not interested in further blood draws.  We will cancel all labs for now.  Patient's son is here now.  Waiting on one of the daughter to come by and then family to let us know about de-escalating care.  Patient has been hypotensive.  A fluid bolus was ordered.  DVT Prophylaxis: Apixaban on hold.  INR was 3.1. Code Status: DNR Family Communication: Discussed with daughters Disposition Plan: Prognosis is guarded to poor.  Status is: Inpatient Remains  inpatient appropriate because: Pneumonia due to COVID-19, acute respiratory failure with hypoxia, transaminitis      Medications: Scheduled:  vitamin C  500 mg Oral QHS   Ipratropium-Albuterol  2 puff Inhalation QID   methylPREDNISolone (SOLU-MEDROL) injection  0.5 mg/kg Intravenous Q12H   Followed by   Derrill Memo ON 08/11/2022] predniSONE  50 mg Oral Daily   polyethylene glycol  17 g Oral BID   sorbitol, milk of mag, mineral oil, glycerin (SMOG) enema  960 mL Rectal Once   zinc sulfate  220 mg Oral QHS   Continuous:  azithromycin     cefTRIAXone (ROCEPHIN)  IV     dextrose 5 % and 0.9 % NaCl with KCl 20 mEq/L 75 mL/hr at 09/02/2022 2228   [id number], haloperidol lactate, ondansetron **OR** ondansetron (ZOFRAN) IV  Antibiotics: Anti-infectives (From admission, onward)    Start     Dose/Rate Route Frequency Ordered Stop   08/08/22 1330   azithromycin (ZITHROMAX) 500 mg in sodium chloride 0.9 % 250 mL IVPB        500 mg 250 mL/hr over 60 Minutes Intravenous Every 24 hours 09/05/2022 1706 08/12/22 1329   08/08/22 1200  cefTRIAXone (ROCEPHIN) 2 g in sodium chloride 0.9 % 100 mL IVPB        2 g 200 mL/hr over 30 Minutes Intravenous Every 24 hours 08/28/2022 1706 08/12/22 1159   08/08/22 1000  remdesivir 100 mg in sodium chloride 0.9 % 100 mL IVPB  Status:  Discontinued       See Hyperspace for full Linked Orders Report.   100 mg 200 mL/hr over 30 Minutes Intravenous Daily 09/01/2022 1625 08/28/2022 1629   08/08/22 1000  remdesivir 100 mg in sodium chloride 0.9 % 100 mL IVPB  Status:  Discontinued       See Hyperspace for full Linked Orders Report.   100 mg 200 mL/hr over 30 Minutes Intravenous Daily 09/04/2022 1631 08/29/2022 1643   09/04/2022 1700  remdesivir 100 mg in sodium chloride 0.9 % 100 mL IVPB  Status:  Discontinued       See Hyperspace for full Linked Orders Report.   100 mg 200 mL/hr over 30 Minutes Intravenous Every hour 08/26/2022 1631 08/09/2022 1643   08/27/2022 1630  remdesivir 200 mg in sodium chloride 0.9% 250 mL IVPB  Status:  Discontinued       See Hyperspace for full Linked Orders Report.   200 mg 580 mL/hr over 30 Minutes Intravenous Once 08/19/2022 1625 08/31/2022 1629   08/20/2022 1500  nirmatrelvir/ritonavir EUA (PAXLOVID) 3 tablet  Status:  Discontinued        3 tablet Oral 2 times daily 08/22/2022 1447 08/13/2022 1503   08/21/2022 1145  cefTRIAXone (ROCEPHIN) 1 g in sodium chloride 0.9 % 100 mL IVPB        1 g 200 mL/hr over 30 Minutes Intravenous  Once 08/08/2022 1141 08/17/2022 1330   08/13/2022 1145  azithromycin (ZITHROMAX) 500 mg in sodium chloride 0.9 % 250 mL IVPB        500 mg 250 mL/hr over 60 Minutes Intravenous  Once 08/26/2022 1141 08/09/2022 1632       Objective:  Vital Signs  Vitals:   08/08/22 0630 08/08/22 0631 08/08/22 0700 08/08/22 0900  BP: (!) 72/46  (!) 79/46 (!) 86/46  Pulse: (!) 52  (!) 53 (!) 56  Resp:  17  19 (!) 27  Temp:  97.8 F (36.6 C)    TempSrc:  Axillary    SpO2:  100%  100% 95%  Weight:        Intake/Output Summary (Last 24 hours) at 08/08/2022 0952 Last data filed at 09/02/2022 1728 Gross per 24 hour  Intake 1850 ml  Output --  Net 1850 ml   Filed Weights   08/23/2022 2053  Weight: 43.1 kg    General appearance: Lethargic but easily arousable. Resp: Noted to be tachypneic.  Crackles bilaterally.  No wheezing or rhonchi. Cardio: S1-S2 is normal regular.  No S3-S4.  No rubs murmurs or bruit GI: Abdomen is soft.  Nontender nondistended.  Bowel sounds are present normal.  No masses organomegaly Extremities: No edema.   Neurologic: No focal neurological deficits.    Lab Results:  Data Reviewed: I have personally reviewed following labs and reports of the imaging studies  CBC: Recent Labs  Lab 08/20/2022 1137  WBC 7.1  NEUTROABS 6.7  HGB 11.9*  HCT 35.7*  MCV 102.9*  PLT 105*    Basic Metabolic Panel: Recent Labs  Lab 08/16/2022 1238 08/15/2022 1903  NA 135  --   K 3.0*  --   CL 99  --   CO2 23  --   GLUCOSE 127*  --   BUN 17  --   CREATININE 1.28*  --   CALCIUM 8.4*  --   MG  --  1.8    GFR: Estimated Creatinine Clearance: 17.5 mL/min (A) (by C-G formula based on SCr of 1.28 mg/dL (H)).  Liver Function Tests: Recent Labs  Lab 09/04/2022 1238 08/24/2022 1903  AST 4,684* 5,582*  ALT 2,262* 2,591*  ALKPHOS 70 95  BILITOT 2.4* 2.5*  PROT 7.0 6.1*  ALBUMIN 3.7 3.2*    Recent Labs  Lab 08/06/2022 1705  AMMONIA 34    Coagulation Profile: Recent Labs  Lab 08/18/2022 1903  INR 3.1*    Cardiac Enzymes: Recent Labs  Lab 09/01/2022 1903  CKTOTAL 71     Thyroid Function Tests: Recent Labs    08/21/2022 1902  TSH 25.293*    Anemia Panel: Recent Labs    08/24/2022 1902 08/09/2022 1904  VITAMINB12  --  5,636*  FOLATE 16.4  --   FERRITIN  --  >7,500*  TIBC  --  271  IRON  --  101  RETICCTPCT 1.6  --     Recent Results (from the past 240  hour(s))  Resp Panel by RT-PCR (Flu A&B, Covid) Anterior Nasal Swab     Status: Abnormal   Collection Time: 09/05/2022 11:40 AM   Specimen: Anterior Nasal Swab  Result Value Ref Range Status   SARS Coronavirus 2 by RT PCR POSITIVE (A) NEGATIVE Final    Comment: (NOTE) SARS-CoV-2 target nucleic acids are DETECTED.  The SARS-CoV-2 RNA is generally detectable in upper respiratory specimens during the acute phase of infection. Positive results are indicative of the presence of the identified virus, but do not rule out bacterial infection or co-infection with other pathogens not detected by the test. Clinical correlation with patient history and other diagnostic information is necessary to determine patient infection status. The expected result is Negative.  Fact Sheet for Patients: EntrepreneurPulse.com.au  Fact Sheet for Healthcare Providers: IncredibleEmployment.be  This test is not yet approved or cleared by the [place] FDA and  has been authorized for detection and/or diagnosis of SARS-CoV-2 by FDA under an Emergency Use Authorization (EUA).  This EUA will remain in effect (meaning this test can be used) for the duration of  the COVID-19 declaration under Section 564(b)(1)  of the A ct, 21 U.S.C. section 360bbb-3(b)(1), unless the authorization is terminated or revoked sooner.     Influenza A by PCR NEGATIVE NEGATIVE Final   Influenza B by PCR NEGATIVE NEGATIVE Final    Comment: (NOTE) The Xpert Xpress SARS-CoV-2/FLU/RSV plus assay is intended as an aid in the diagnosis of influenza from Nasopharyngeal swab specimens and should not be used as a sole basis for treatment. Nasal washings and aspirates are unacceptable for Xpert Xpress SARS-CoV-2/FLU/RSV testing.  Fact Sheet for Patients: EntrepreneurPulse.com.au  Fact Sheet for Healthcare Providers: IncredibleEmployment.be  This test is not yet  approved or cleared by the [place] FDA and has been authorized for detection and/or diagnosis of SARS-CoV-2 by FDA under an Emergency Use Authorization (EUA). This EUA will remain in effect (meaning this test can be used) for the duration of the COVID-19 declaration under Section 564(b)(1) of the Act, 21 U.S.C. section 360bbb-3(b)(1), unless the authorization is terminated or revoked.  Performed at [facility], [place] [address], [place], [place] [address]       Radiology Studies: US ABDOMEN COMPLETE WITH LIVER DOPPLER  Result Date: 08/15/2022 CLINICAL DATA:  Elevated LFTs EXAM: DUPLEX ULTRASOUND OF LIVER TECHNIQUE: Color and duplex Doppler ultrasound was performed to evaluate the hepatic in-flow and out-flow vessels. COMPARISON:  None Available. FINDINGS: Liver: Normal parenchymal echogenicity. Normal hepatic contour without nodularity. 6 mm cyst is noted in the left lobe of the liver. Main Portal Vein size: 1.1 cm Portal Vein Velocities Main Prox:  64.8 cm/sec Main Mid: 50.4 cm/sec Main Dist:  56.7 cm/sec Right: 60.2 cm/sec Left: 58.5 cm/sec Hepatic Vein Velocities Right:  60.2 cm/sec Middle:  39.9 cm/sec Left:  44.6 cm/sec IVC: Present and patent with normal respiratory phasicity. Hepatic Artery Velocity:  165.7 cm/sec Splenic Vein Velocity:  42.8 cm/sec Spleen: 6.4 cm x 4.0 cm x 7.3 cm with a total volume of 95.6 cm^3 (411 cm^3 is upper limit normal) Portal Vein Occlusion/Thrombus: No Splenic Vein Occlusion/Thrombus: No Ascites: None Varices: None Portal vein branches are all hepatopetal in nature. The hepatic vein branches are all hepatofugal in nature. Incidental note is made of a 1.8 cm simple cyst in the left kidney this is similar to prior CT examination. IMPRESSION: Hepatic duplex is within normal limits. Small left renal simple cyst.  No follow-up is recommended. Electronically Signed   By: Inez Catalina M.D.   On: 08/11/2022 20:56   CT ABDOMEN PELVIS WO  CONTRAST  Result Date: 08/12/2022 CLINICAL DATA:  Bowel obstruction suspected. Lethargy and generalized weakness since yesterday. Nausea, vomiting. EXAM: CT ABDOMEN AND PELVIS WITHOUT CONTRAST TECHNIQUE: Multidetector CT imaging of the abdomen and pelvis was performed following the standard protocol without IV contrast. RADIATION DOSE REDUCTION: This exam was performed according to the departmental dose-optimization program which includes automated exposure control, adjustment of the mA and/or kV according to patient size and/or use of iterative reconstruction technique. COMPARISON:  None Available. FINDINGS: Lower chest: Heart is enlarged. Coronary artery calcifications are present. No pericardial effusion. Large hiatal hernia. Hepatobiliary: Liver parenchyma is hyperdense, consistent with prior amiodarone treatment. Sub centimeter cysts are noted in the liver, largest in the LEFT hepatic lobe measuring 0.8 centimeters. No further imaging is recommended. No suspicious liver lesion. Gallbladder is present and unremarkable. Pancreas: Unremarkable. No pancreatic ductal dilatation or surrounding inflammatory changes. Spleen: Normal in size without focal abnormality. Adrenals/Urinary Tract: Normal adrenal glands. There is bilateral hydronephrosis to level of the mid ureters. No evidence for obstructing calculi.  The urinary bladder is markedly distended. Stomach/Bowel: Large hiatal hernia. Stomach is otherwise normal. Small bowel loops are unremarkable. The appendix is well seen and normal in appearance. There is significant, consolidated appearing stool within the rectum, measuring 10.2 x 7.7 x 10.1 centimeters, consistent with impaction. Numerous colonic diverticular present. No evidence for acute diverticulitis or colonic mass. Vascular/Lymphatic: There is dense atherosclerotic calcification of the abdominal aorta. Aorta is tortuous but not aneurysmal. Reproductive: Uterus is present.  No adnexal mass. Other: No  abdominal wall hernia or abnormality. No abdominopelvic ascites. Musculoskeletal: LEFT hip arthroplasty. No evidence for acute abnormality. IMPRESSION: 1. Rectosigmoid stool impaction, measuring 10 centimeters. 2. Marked distention of the urinary bladder. 3. Bilateral hydronephrosis to the level of the mid ureters. The hydronephrosis may be related to marked distension of the urinary bladder. No evidence for obstructing calculi. 4. Large hiatal hernia. 5. Colonic diverticulosis without acute diverticulitis. 6. Hyperdense liver, consistent with prior amiodarone treatment. 7. Cardiomegaly. Coronary artery calcifications. Electronically Signed   By: Nolon Nations M.D.   On: 08/21/2022 11:33   DG Chest Port 1 View  Result Date: 09/03/2022 CLINICAL DATA:  Shortness of breath, weakness EXAM: PORTABLE CHEST 1 VIEW COMPARISON:  07/27/2021 FINDINGS: Stable cardiomegaly. Aortic atherosclerosis. Hiatal hernia. Patchy opacity in the right upper lobe. Mild bibasilar atelectasis. No large pleural fluid collection. No pneumothorax. Osseous structures are demineralized. IMPRESSION: Patchy opacity in the right upper lobe suspicious for pneumonia. Electronically Signed   By: Davina Poke D.O.   On: 08/23/2022 11:06       LOS: 1 day   [facility] Pager on [email or web address]  08/08/2022, 9:52 AM

## 2022-08-08 NOTE — ED Notes (Signed)
Patient told daughter " Mickel Baas if you love me just pull the plug".

## 2022-08-08 NOTE — Plan of Care (Signed)
  Problem: Safety: Goal: Ability to remain free from injury will improve Outcome: Progressing   Problem: Coping: Goal: Psychosocial and spiritual needs will be supported Outcome: Progressing   

## 2022-08-08 NOTE — ED Notes (Signed)
Patient daughter asked to leave her mother alone since she just went to sleep.

## 2022-08-08 NOTE — ED Notes (Signed)
Daughter Mickel Baas at bedside. Mickel Baas said her mother is agitated and has never been like this. MD made aware of agitation.

## 2022-08-08 NOTE — ED Notes (Signed)
Patient had a huge bowel movement. MD made aware. Holding next enema.

## 2022-08-08 NOTE — ED Notes (Signed)
Called daughter in patient is trying to pull at lines and stating " I dont want this".

## 2022-08-09 DIAGNOSIS — U071 COVID-19: Secondary | ICD-10-CM | POA: Diagnosis not present

## 2022-08-09 DIAGNOSIS — J1282 Pneumonia due to coronavirus disease 2019: Secondary | ICD-10-CM | POA: Diagnosis not present

## 2022-08-09 MED ORDER — ORAL CARE MOUTH RINSE
15.0000 mL | OROMUCOSAL | Status: DC
  Administered 2022-08-09 (×2): 15 mL via OROMUCOSAL

## 2022-08-09 MED ORDER — ORAL CARE MOUTH RINSE: 15.0000 mL | OROMUCOSAL | Status: DC | PRN

## 2022-08-09 MED ORDER — CHLORHEXIDINE GLUCONATE CLOTH 2 % EX PADS
6.0000 | MEDICATED_PAD | Freq: Every day | CUTANEOUS | Status: DC
  Administered 2022-08-09: 6 via TOPICAL

## 2022-08-09 MED ORDER — DEXTROSE-NACL 5-0.9 % IV SOLN: INTRAVENOUS | Status: DC

## 2022-08-09 MED ORDER — MIRTAZAPINE 15 MG PO TABS
7.5000 mg | ORAL_TABLET | Freq: Every day | ORAL | Status: DC
  Administered 2022-08-09: 7.5 mg via ORAL
  Filled 2022-08-09: qty 1

## 2022-08-09 NOTE — Plan of Care (Signed)
  Problem: Education: Goal: Knowledge of General Education information will improve Description: Including pain rating scale, medication(s)/side effects and non-pharmacologic comfort measures Outcome: Progressing   Problem: Coping: Goal: Level of anxiety will decrease Outcome: Progressing   Problem: Pain Managment: Goal: General experience of comfort will improve Outcome: Progressing   Problem: Safety: Goal: Ability to remain free from injury will improve Outcome: Progressing   Problem: Skin Integrity: Goal: Risk for impaired skin integrity will decrease Outcome: Progressing   Problem: Coping: Goal: Psychosocial and spiritual needs will be supported Outcome: Progressing   Problem: Respiratory: Goal: Will maintain a patent airway Outcome: Progressing   

## 2022-08-09 NOTE — Progress Notes (Signed)
PROGRESS NOTE  Stephanie Reyes  DOB: 04/10/1924  PCP: Timberlake, Kathryn S, MD MRN:[id number]  DOA: 08/16/2022  LOS: 2 days  Hospital Day: 3  Brief narrative: Stephanie Reyes is a 88 y.o. female with PMH significant for paroxysmal atrial fibrillation on amiodarone and Eliquis, status post DC cardioversion in 2022, history of embolic stroke in 2022, essential hypertension, who was in her usual state of health until 9/30, quite functional at baseline. Sunday 10/1, patient had a fever, developed cough, appetite went down making her weak.  EMS was called and they gave her fluid after which she perked up but again felt worse and was brought to the ED next day on 10/2. In the ED, she was found to be COVID-19 positive. Chest x-ray showed right upper lobe opacity. LFTs showed significant transaminitis. CT abdomen showed marked distention of the urinary bladder.Bilateral hydronephrosis to the level of the mid ureters. No evidence for obstructing calculi. Foley catheter was placed in ED. Patient was hospitalized for further evaluation and management.  Subjective: Patient was seen and examined this morning.  Pleasant elderly Caucasian female.  Propped up in bed.  Slow to respond.  But able to answer orientation questions.  Multiple family members at bedside. Foley catheter was in place with very minimal low urine output Chart reviewed.  No fever, heart rate in 50s, blood pressure in normal range, breathing on 5 L oxygen. Last set of labs from last night with LFTs in thousands.  Assessment and plan: COVID pneumonia Acute respiratory failure with hypoxia  Presented with fever, shortness of breath, lethargy COVID PCR positive in the ED Chest x-ray on admission showed right upper lobe opacity Unable to start any antiviral because of significant transaminitis.  Patient is currently on Solu-Medrol IV twice daily which I will continue. Oxygen  SpO2: 92 % O2 Flow Rate (L/min): 5 L/min Wean down  as tolerated Continue as needed antitussives. CRP level is not was elevated but ferritin level is significantly elevated to over 7500.   WBC and inflammatory markers trend as below. Recent Labs  Lab 08/28/2022 1038 08/23/2022 1137 08/06/2022 1140 08/12/2022 1238 08/19/2022 1903 09/01/2022 1904  SARSCOV2NAA  --   --  POSITIVE*  --   --   --   WBC  --  7.1  --   --   --   --   LATICACIDVEN 2.5*  --   --  3.5*  --   --   FERRITIN  --   --   --   --   --  >7,500*  CRP  --   --   --   --   --  2.1*  ALT  --   --   --  2,262* 2,591*  --    Acute liver failure Baseline LFTs normal in July 2023. Presented with AST/ALT elevated to 4684/2262 with normal alk phos, elevated total bilirubin to 2.4 and elevated INR, normal ammonia level, nonreactive hepatitis panel, Tylenol level not elevated Repeat LFTs last night were significantly elevated as well. Ultrasound liver duplex within normal limits, normal hepatic echogenicity. Etiology unclear: Could be secondary to liver injury due to COVID, amiodarone, statin. Continue to trend LFTs Recent Labs  Lab 09/01/2022 1137 08/11/2022 1238 09/03/2022 1705 08/09/2022 1903  AST  --  4,684*  --  5,582*  ALT  --  2,262*  --  2,591*  ALKPHOS  --  70  --  95  BILITOT  --  2.4*  --  2.5*    BILIDIR  --   --   --  1.6*  IBILI  --   --   --  0.9  PROT  --  7.0  --  6.1*  ALBUMIN  --  3.7  --  3.2*  AMMONIA  --   --  34  --   INR  --   --   --  3.1*  PLT 105*  --   --   --    Acute urinary retention Bilateral hydronephrosis CT abdomen showed marked distention of the urinary bladder.Bilateral hydronephrosis to the level of the mid ureters. No evidence for obstructing calculi. Foley catheter was placed in ED. Low concentrated urine output this morning.  Continue to monitor output with hydration.   Failure to thrive Nausea and vomiting Symptoms most likely due to COVID-19.  Encourage oral intake.  Start D5 NS at 75 mill per hour  Hypokalemia Initial potassium level  was low at 3.  Replacement given.  Recheck tomorrow.  Also check magnesium and phosphorus level Recent Labs  Lab 08/16/2022 1238 08/30/2022 1903  K 3.0*  --   MG  --  1.8   Lactic acidosis Most probably due to liver dysfunction.  Continue to trend  CKD3b Trend creatinine Recent Labs    08/23/21 1503 08/27/2022 1238  BUN 12 17  CREATININE 1.06* 1.28*   Hypothyroidism TSH noted to be significantly elevated at 25.29.  She was on amiodarone which could be the reason for her thyroid dysfunction.   Obtain free T4 level.   Recent Labs    08/09/2022 1902  TSH 25.293*   History of paroxysmal atrial fibrillation s/p cardioversion 2022 PTA on amiodarone and Eliquis.  Currently on hold because of liver injury.   Remain in normal sinus rhythm.  Followed by Dr. Hilty.  EF is noted to be normal on echocardiogram last year.  Monitor on telemetry.     Macrocytic anemia No dense overt bleeding.  Anemia panel reviewed.  No deficiencies identified. Recent Labs    08/23/21 1503 08/19/2022 1137  HGB 14.9 11.9*   Thrombocytopenia, mild Slightly low.  Continue to monitor.  Avoid heparin products Recent Labs  Lab 08/23/2022 1137  PLT 105*    Severe constipation with fecal impaction CT scan on admission showed rectosigmoid stool impaction measuring 10 cm.  Given enema with good response.  Continue bowel regimen.    History of embolic stroke in 2022 Apixaban currently on hold due to high risk of bleeding.  Consider resuming in the next 24 to 48 hours depending on stability.   Concern for skin cancer Patient has a very suspicious appearing lesion in her lateral right thigh.  Will need to be addressed in the outpatient setting.  Goals of care   Code Status: DNR  Patient is DNR.  I met with multiple family members at bedside today.  They want to continue current level of care since the patient is not in any acute pain or distress.  If her condition start to worsen, comfort care will be a reasonable  option.  Family understands and accepts the plan.    Mobility: PT eval ordered  Skin assessment:  Pressure Injury 08/08/22 Buttocks Right;Left;Mid;Posterior Deep Tissue Pressure Injury - Purple or maroon localized area of discolored intact skin or blood-filled blister due to damage of underlying soft tissue from pressure and/or shear. (Active)  08/08/22 2025  Location: Buttocks  Location Orientation: Right;Left;Mid;Posterior  Staging: Deep Tissue Pressure Injury - Purple or maroon localized area   of discolored intact skin or blood-filled blister due to damage of underlying soft tissue from pressure and/or shear.  Wound Description (Comments):   Present on Admission: Yes    Nutritional status:  Body mass index is 18.65 kg/m.          Diet:  Diet Order             Diet regular Room service appropriate? Yes; Fluid consistency: Thin  Diet effective now                   DVT prophylaxis:  SCDs Start: 09/02/2022 1702   Antimicrobials: None Fluid: D5 NS at 75 ml per hour Consultants: None Family Communication: Multiple family members at bedside  Status is: Inpatient  Continue in-hospital care because: Significantly elevated liver enzymes, Level of care: Progressive   Dispo: The patient is from: Home              Anticipated d/c is to: Pending clinical course              Patient currently is not medically stable to d/c.   Difficult to place patient No     Infusions:   azithromycin 500 mg (08/08/22 1427)   cefTRIAXone (ROCEPHIN)  IV 2 g (08/09/22 1137)   dextrose 5 % and 0.9% NaCl      Scheduled Meds:  vitamin C  500 mg Oral QHS   Chlorhexidine Gluconate Cloth  6 each Topical Daily   Ipratropium-Albuterol  2 puff Inhalation QID   methylPREDNISolone (SOLU-MEDROL) injection  0.5 mg/kg Intravenous Q12H   Followed by   [START ON 08/11/2022] predniSONE  50 mg Oral Daily   mirtazapine  7.5 mg Oral QHS   mouth rinse  15 mL Mouth Rinse 4 times per day    polyethylene glycol  17 g Oral BID   sorbitol, milk of mag, mineral oil, glycerin (SMOG) enema  960 mL Rectal Once   zinc sulfate  220 mg Oral QHS    PRN meds: guaiFENesin-dextromethorphan, haloperidol lactate, lip balm, ondansetron **OR** ondansetron (ZOFRAN) IV, mouth rinse   Antimicrobials: Anti-infectives (From admission, onward)    Start     Dose/Rate Route Frequency Ordered Stop   08/08/22 1330  azithromycin (ZITHROMAX) 500 mg in sodium chloride 0.9 % 250 mL IVPB        500 mg 250 mL/hr over 60 Minutes Intravenous Every 24 hours 08/16/2022 1706 08/12/22 1329   08/08/22 1200  cefTRIAXone (ROCEPHIN) 2 g in sodium chloride 0.9 % 100 mL IVPB        2 g 200 mL/hr over 30 Minutes Intravenous Every 24 hours 08/09/2022 1706 08/12/22 1159   08/08/22 1000  remdesivir 100 mg in sodium chloride 0.9 % 100 mL IVPB  Status:  Discontinued       See Hyperspace for full Linked Orders Report.   100 mg 200 mL/hr over 30 Minutes Intravenous Daily 08/09/2022 1625 08/25/2022 1629   08/08/22 1000  remdesivir 100 mg in sodium chloride 0.9 % 100 mL IVPB  Status:  Discontinued       See Hyperspace for full Linked Orders Report.   100 mg 200 mL/hr over 30 Minutes Intravenous Daily 09/04/2022 1631 08/28/2022 1643   08/26/2022 1700  remdesivir 100 mg in sodium chloride 0.9 % 100 mL IVPB  Status:  Discontinued       See Hyperspace for full Linked Orders Report.   100 mg 200 mL/hr over 30 Minutes Intravenous Every hour 09/02/2022 1631 08/25/2022   1643   08/13/2022 1630  remdesivir 200 mg in sodium chloride 0.9% 250 mL IVPB  Status:  Discontinued       See Hyperspace for full Linked Orders Report.   200 mg 580 mL/hr over 30 Minutes Intravenous Once 08/29/2022 1625 08/31/2022 1629   08/26/2022 1500  nirmatrelvir/ritonavir EUA (PAXLOVID) 3 tablet  Status:  Discontinued        3 tablet Oral 2 times daily 08/16/2022 1447 08/11/2022 1503   08/06/2022 1145  cefTRIAXone (ROCEPHIN) 1 g in sodium chloride 0.9 % 100 mL IVPB        1 g 200 mL/hr  over 30 Minutes Intravenous  Once 08/20/2022 1141 08/06/2022 1330   09/05/2022 1145  azithromycin (ZITHROMAX) 500 mg in sodium chloride 0.9 % 250 mL IVPB        500 mg 250 mL/hr over 60 Minutes Intravenous  Once 08/20/2022 1141 08/31/2022 1632       Objective: Vitals:   08/09/22 0848 08/09/22 1201  BP:  121/74  Pulse:  (!) 59  Resp:  19  Temp:  98.7 F (37.1 C)  SpO2: 94% 92%    Intake/Output Summary (Last 24 hours) at 08/09/2022 1333 Last data filed at 08/09/2022 0600 Gross per 24 hour  Intake 1897.46 ml  Output 50 ml  Net 1847.46 ml   Filed Weights   08/31/2022 2053 08/08/22 1728  Weight: 43.1 kg 50.8 kg   Weight change: 7.757 kg Body mass index is 18.65 kg/m.   Physical Exam: General exam: Pleasant, elderly.  Not in physical distress Skin: No rashes, lesions or ulcers. HEENT: Atraumatic, normocephalic, no obvious bleeding Lungs: Transmitted gurgling throat sounds CVS: Regular rate and rhythm, no murmur GI/Abd soft, nontender, nondistended, bowel sound present CNS: Alert, awake, slow to respond but oriented to place and person Psychiatry: Sad affect Extremities: No pedal edema, no calf tenderness  Data Review: I have personally reviewed the laboratory data and studies available.  F/u labs ordered Unresulted Labs (From admission, onward)     Start     Ordered   Aug 25, 2022 0500  CBC with Differential/Platelet  Daily at 5am,   R      08/09/22 1218   2022-08-25 0500  Comprehensive metabolic panel  Daily at 5am,   R      08/09/22 1218   2022-08-25 0500  Lactic acid, plasma  Tomorrow morning,   R        08/09/22 1314   25-Aug-2022 0500  T4, free  Tomorrow morning,   R        08/09/22 1314   2022/08/25 0500  Ammonia  Tomorrow morning,   R        08/09/22 1314   08/25/22 0500  Protime-INR  Daily at 5am,   R      08/09/22 1314   2022/08/25 0500  Magnesium  Tomorrow morning,   R        08/09/22 1317   08/25/22 0500  Phosphorus  Tomorrow morning,   R        08/09/22 1317             Signed, Terrilee Croak, MD [facility] 08/09/2022

## 2022-08-09 NOTE — TOC Initial Note (Signed)
Transition of Care [facility]) - Initial/Assessment Note    Patient Details  Name: Stephanie Reyes MRN: [id number] Date of Birth: [birth year]  Transition of Care [facility]) CM/SW Contact:    Roseanne Kaufman, RN Phone Number: 08/09/2022, 12:20 PM  Clinical Narrative:    Thomas Hoff ith patient's daughter Stephanie Reyes via phone at bedside. Stephanie Reyes reports patient does not currently have any hh services in place. RNCM will contact patient and family later, as MD was in the room speaking with the family.  TOC will continue to follow.                 Barriers to Discharge: Continued Medical Work up   Patient Goals and CMS Choice        Expected Discharge Plan and Services   In-house Referral: NA Discharge Planning Services: CM Consult   Living arrangements for the past 2 months: Single Family Home                                      Prior Living Arrangements/Services Living arrangements for the past 2 months: Single Family Home Lives with:: Self              Current home services: Other (comment) (No HH services)    Activities of Daily Living Home Assistive Devices/Equipment: Walker (specify type), Dentures (specify type), Eyeglasses (full set dentures, reading glasses) ADL Screening (condition at time of admission) Patient's cognitive ability adequate to safely complete daily activities?: Yes Is the patient deaf or have difficulty hearing?: Yes Does the patient have difficulty seeing, even when wearing glasses/contacts?: Yes Does the patient have difficulty concentrating, remembering, or making decisions?: No (short term memory issues) Patient able to express need for assistance with ADLs?: Yes Does the patient have difficulty dressing or bathing?: Yes Independently performs ADLs?: No Communication: Independent Dressing (OT): Needs assistance Is this a change from baseline?: Change from baseline, expected to last >3 days Grooming: Independent Feeding: Independent Bathing:  Needs assistance Is this a change from baseline?: Change from baseline, expected to last >3 days Toileting: Needs assistance Is this a change from baseline?: Change from baseline, expected to last >3days In/Out Bed: Needs assistance Is this a change from baseline?: Change from baseline, expected to last >3 days Walks in Home: Dependent Is this a change from baseline?: Change from baseline, expected to last >3 days Does the patient have difficulty walking or climbing stairs?: Yes Weakness of Legs: Both Weakness of Arms/Hands: Both  Permission Sought/Granted                  Emotional Assessment         Alcohol / Substance Use: Not Applicable Psych Involvement: No (comment)  Admission diagnosis:  Urinary retention [R33.9] Hepatic dysfunction [K76.9] Constipation, unspecified constipation type [K59.00] Community acquired pneumonia of right middle lobe of lung [J18.9] COVID [U07.1] Pneumonia due to COVID-19 virus [U07.1, J12.82] Patient Active Problem List   Diagnosis Date Noted   Pneumonia due to COVID-19 virus 2022/08/25   Transaminitis 08-25-22   Acute hepatitis 08/25/22   Thrombocytopenia ([place]) 2022-08-25   Macrocytic anemia 2022/08/25   COVID-19 virus infection 2022/08/25   Persistent atrial fibrillation ([place])    Chronic anticoagulation 08/09/2021   CVA (cerebral vascular accident) ([place]) 07/28/2021   Atrial fibrillation with RVR ([place]) 07/27/2021   Orthostatic hypotension 04/05/2018   Displaced fracture of left femoral neck ([place]) 02/09/2018  Left displaced femoral neck fracture ([place]) 02/07/2018   Femoral neck fracture ([place]) 02/07/2018   Ascending aortic aneurysm ([place]) 04/26/2017   Hypokalemia 02/27/2017   Benign essential HTN 02/27/2017   Renal insufficiency 02/27/2017   AF (paroxysmal atrial fibrillation) ([place]) 02/27/2017   Sinus tachycardia 02/27/2017   Near syncope 02/26/2017   PCP:  Glenis Smoker, MD Pharmacy:   [id number] -  [place], [place] - [address] [place] [address] Phone: [phone] Fax: [phone]  [facility] [address] [place] [address] Phone: [phone] Fax: [phone]     Social Determinants of Health (SDOH) Interventions    Readmission Risk Interventions     No data to display

## 2022-08-09 NOTE — Progress Notes (Signed)
Notified on call provider that patient has only had 50 mL of urinary output despite having a Foley catheter. Patient's urinary output is dark and tea-colored. Will pass along to dayshift.

## 2022-08-10 ENCOUNTER — Inpatient Hospital Stay (HOSPITAL_COMMUNITY): Payer: Medicare Other

## 2022-08-10 DIAGNOSIS — U071 COVID-19: Secondary | ICD-10-CM | POA: Diagnosis not present

## 2022-08-10 DIAGNOSIS — J1282 Pneumonia due to coronavirus disease 2019: Secondary | ICD-10-CM | POA: Diagnosis not present

## 2022-08-10 LAB — LACTIC ACID, PLASMA: Lactic Acid, Venous: 3 mmol/L (ref 0.5–1.9)

## 2022-08-10 LAB — T4, FREE: Free T4: 0.42 ng/dL — ABNORMAL LOW (ref 0.61–1.12)

## 2022-08-10 LAB — COMPREHENSIVE METABOLIC PANEL
ALT: 2007 U/L — ABNORMAL HIGH (ref 0–44)
AST: 2512 U/L — ABNORMAL HIGH (ref 15–41)
Albumin: 2.8 g/dL — ABNORMAL LOW (ref 3.5–5.0)
Alkaline Phosphatase: 125 U/L (ref 38–126)
Anion gap: 19 — ABNORMAL HIGH (ref 5–15)
BUN: 44 mg/dL — ABNORMAL HIGH (ref 8–23)
CO2: 11 mmol/L — ABNORMAL LOW (ref 22–32)
Calcium: 5.8 mg/dL — CL (ref 8.9–10.3)
Chloride: 108 mmol/L (ref 98–111)
Creatinine, Ser: 3.76 mg/dL — ABNORMAL HIGH (ref 0.44–1.00)
GFR, Estimated: 11 mL/min — ABNORMAL LOW (ref >60)
Glucose, Bld: 139 mg/dL — ABNORMAL HIGH (ref 70–99)
Potassium: 3.9 mmol/L (ref 3.5–5.1)
Sodium: 138 mmol/L (ref 135–145)
Total Bilirubin: 3.7 mg/dL — ABNORMAL HIGH (ref 0.3–1.2)
Total Protein: 5.5 g/dL — ABNORMAL LOW (ref 6.5–8.1)

## 2022-08-10 LAB — CBC WITH DIFFERENTIAL/PLATELET
Abs Immature Granulocytes: 0.09 10*3/uL — ABNORMAL HIGH (ref 0.00–0.07)
Basophils Absolute: 0 10*3/uL (ref 0.0–0.1)
Basophils Relative: 0 %
Eosinophils Absolute: 0 10*3/uL (ref 0.0–0.5)
Eosinophils Relative: 0 %
HCT: 37.5 % (ref 36.0–46.0)
Hemoglobin: 12.2 g/dL (ref 12.0–15.0)
Immature Granulocytes: 1 %
Lymphocytes Relative: 1 %
Lymphs Abs: 0.2 10*3/uL — ABNORMAL LOW (ref 0.7–4.0)
MCH: 33.4 pg (ref 26.0–34.0)
MCHC: 32.5 g/dL (ref 30.0–36.0)
MCV: 102.7 fL — ABNORMAL HIGH (ref 80.0–100.0)
Monocytes Absolute: 0.5 10*3/uL (ref 0.1–1.0)
Monocytes Relative: 4 %
Neutro Abs: 11.4 10*3/uL — ABNORMAL HIGH (ref 1.7–7.7)
Neutrophils Relative %: 94 %
Platelets: 139 10*3/uL — ABNORMAL LOW (ref 150–400)
RBC: 3.65 MIL/uL — ABNORMAL LOW (ref 3.87–5.11)
RDW: 14.3 % (ref 11.5–15.5)
WBC: 12.1 10*3/uL — ABNORMAL HIGH (ref 4.0–10.5)
nRBC: 0.2 % (ref 0.0–0.2)

## 2022-08-10 LAB — BLOOD GAS, VENOUS
Acid-base deficit: 13.1 mmol/L — ABNORMAL HIGH (ref 0.0–2.0)
Bicarbonate: 10.8 mmol/L — ABNORMAL LOW (ref 20.0–28.0)
O2 Saturation: 91.3 %
Patient temperature: 37
pCO2, Ven: 21 mmHg — ABNORMAL LOW (ref 44–60)
pH, Ven: 7.32 (ref 7.25–7.43)
pO2, Ven: 63 mmHg — ABNORMAL HIGH (ref 32–45)

## 2022-08-10 LAB — GLUCOSE, CAPILLARY
Glucose-Capillary: 118 mg/dL — ABNORMAL HIGH (ref 70–99)
Glucose-Capillary: 122 mg/dL — ABNORMAL HIGH (ref 70–99)

## 2022-08-10 LAB — PROTIME-INR
INR: 1.6 — ABNORMAL HIGH (ref 0.8–1.2)
Prothrombin Time: 18.6 seconds — ABNORMAL HIGH (ref 11.4–15.2)

## 2022-08-10 LAB — PHOSPHORUS: Phosphorus: 8.1 mg/dL — ABNORMAL HIGH (ref 2.5–4.6)

## 2022-08-10 LAB — AMMONIA: Ammonia: 27 umol/L (ref 9–35)

## 2022-08-10 LAB — MAGNESIUM: Magnesium: 1.9 mg/dL (ref 1.7–2.4)

## 2022-08-10 MED ORDER — LORAZEPAM 2 MG/ML IJ SOLN
1.0000 mg | INTRAMUSCULAR | Status: DC | PRN
  Administered 2022-08-10: 1 mg via INTRAVENOUS
  Filled 2022-08-10: qty 1

## 2022-08-10 MED ORDER — MORPHINE 100MG IN NS 100ML (1MG/ML) PREMIX INFUSION
2.0000 mg/h | INTRAVENOUS | Status: DC
  Administered 2022-08-10: 2 mg/h via INTRAVENOUS
  Filled 2022-08-10: qty 100

## 2022-08-10 MED ORDER — IPRATROPIUM-ALBUTEROL 0.5-2.5 (3) MG/3ML IN SOLN
3.0000 mL | RESPIRATORY_TRACT | Status: DC | PRN
  Administered 2022-08-10: 3 mL via RESPIRATORY_TRACT
  Filled 2022-08-10: qty 3

## 2022-08-10 MED ORDER — MORPHINE SULFATE (PF) 2 MG/ML IV SOLN
0.5000 mg | INTRAVENOUS | Status: DC | PRN
  Administered 2022-08-10 (×2): 1 mg via INTRAVENOUS
  Filled 2022-08-10 (×2): qty 1

## 2022-08-10 MED ORDER — FUROSEMIDE 10 MG/ML IJ SOLN
20.0000 mg | Freq: Once | INTRAMUSCULAR | Status: AC
  Administered 2022-08-10: 20 mg via INTRAVENOUS
  Filled 2022-08-10: qty 2

## 2022-08-10 MED ORDER — MORPHINE SULFATE (PF) 2 MG/ML IV SOLN
0.5000 mg | INTRAVENOUS | Status: DC | PRN
  Administered 2022-08-10: 1 mg via INTRAVENOUS
  Administered 2022-08-10: 0.5 mg via INTRAVENOUS
  Filled 2022-08-10 (×2): qty 1

## 2022-08-10 MED ORDER — MORPHINE SULFATE (PF) 2 MG/ML IV SOLN: 1.0000 mg | INTRAVENOUS | Status: DC | PRN

## 2022-09-06 NOTE — Death Summary Note (Signed)
DEATH SUMMARY   Patient Details  Name: Stephanie Reyes MRN: [id number] DOB: 11-27-1923 YSA:[id number], Meridee Score, MD Admission/Discharge Information   Admit Date:  2022/08/16  Date of Death: Date of Death: Aug 19, 2022  Time of Death: Time of Death: 1420  Length of Stay: 3   Principle Cause of death: Hypoxemic encephalopathy due to COVID pneumonia  Hospital Diagnoses: Principal Problem:   Pneumonia due to COVID-19 virus Active Problems:   AF (paroxysmal atrial fibrillation) (HCC)   Transaminitis   Acute hepatitis   Thrombocytopenia (HCC)   Macrocytic anemia   COVID-19 virus infection   Hospital Course: REHMAT MURTAGH is a 88 y.o. female with PMH significant for paroxysmal atrial fibrillation on amiodarone and Eliquis, status post DC cardioversion in 2022, history of embolic stroke in 2022, essential hypertension, who was in her usual state of health until 9/30, quite functional at baseline. Sunday 10/1, patient had a fever, developed cough, appetite went down making her weak.  EMS was called and they gave her fluid after which she perked up but again felt worse and was brought to the ED next day on August 17, 2023. In the ED, she was found to be COVID-19 positive. Chest x-ray showed right upper lobe opacity. LFTs showed significant transaminitis. CT abdomen showed marked distention of the urinary bladder.Bilateral hydronephrosis to the level of the mid ureters. No evidence for obstructing calculi. Foley catheter was placed in ED. Patient was hospitalized for further evaluation and management.   Patient was started on IV steroids, IV fluid, high flow oxygen as per her requirement.  On my discussion with the family on 10/4, family wanted to see if she perks up with these measures in the next 24 to 48 hours. Overnight on 10/4 to 10/5, patient went to respiratory distress. Rapid response was called.  After discussion with family members, patient was made comfort care.  At the time of my  evaluation this morning, patient was somnolent, open eyes and verbal command.  She verbalized that she is uncomfortable and wants to be comfortable.  Multiple family members at bedside were very understandable and agreeable to respect patient's wishes.   As per family's request, patient was kept on IV morphine as needed and later switched to morphine drip.  After all the family members visited her, she was switched from high flow oxygen to low flow oxygen.  Patient peacefully passed away at 1420 p.m. this afternoon.  Multiple family members at bedside.      Procedures: None  Consultations: None  The results of significant diagnostics from this hospitalization (including imaging, microbiology, ancillary and laboratory) are listed below for reference.   Significant Diagnostic Studies: DG CHEST PORT 1 VIEW  Result Date: Aug 19, 2022 CLINICAL DATA:  Shortness of breath EXAM: PORTABLE CHEST 1 VIEW COMPARISON:  08-16-2022 FINDINGS: Cardiac shadow is enlarged but stable. Aortic calcifications are noted. Diffuse increase in airspace opacity is noted bilaterally consistent with progressive edema. Small effusions are noted bilaterally. No bony abnormality is seen. IMPRESSION: Increasing edema and effusions. Electronically Signed   By: Alcide Clever M.D.   On: 19-Aug-2022 01:03   US ABDOMEN COMPLETE WITH LIVER DOPPLER  Result Date: 08-16-22 CLINICAL DATA:  Elevated LFTs EXAM: DUPLEX ULTRASOUND OF LIVER TECHNIQUE: Color and duplex Doppler ultrasound was performed to evaluate the hepatic in-flow and out-flow vessels. COMPARISON:  None Available. FINDINGS: Liver: Normal parenchymal echogenicity. Normal hepatic contour without nodularity. 6 mm cyst is noted in the left lobe of the liver. Main Portal Vein size: 1.1  cm Portal Vein Velocities Main Prox:  64.8 cm/sec Main Mid: 50.4 cm/sec Main Dist:  56.7 cm/sec Right: 60.2 cm/sec Left: 58.5 cm/sec Hepatic Vein Velocities Right:  60.2 cm/sec Middle:  39.9 cm/sec Left:   44.6 cm/sec IVC: Present and patent with normal respiratory phasicity. Hepatic Artery Velocity:  165.7 cm/sec Splenic Vein Velocity:  42.8 cm/sec Spleen: 6.4 cm x 4.0 cm x 7.3 cm with a total volume of 95.6 cm^3 (411 cm^3 is upper limit normal) Portal Vein Occlusion/Thrombus: No Splenic Vein Occlusion/Thrombus: No Ascites: None Varices: None Portal vein branches are all hepatopetal in nature. The hepatic vein branches are all hepatofugal in nature. Incidental note is made of a 1.8 cm simple cyst in the left kidney this is similar to prior CT examination. IMPRESSION: Hepatic duplex is within normal limits. Small left renal simple cyst.  No follow-up is recommended. Electronically Signed   By: Alcide Clever M.D.   On: 08/13/2022 20:56   CT ABDOMEN PELVIS WO CONTRAST  Result Date: 08/23/2022 CLINICAL DATA:  Bowel obstruction suspected. Lethargy and generalized weakness since yesterday. Nausea, vomiting. EXAM: CT ABDOMEN AND PELVIS WITHOUT CONTRAST TECHNIQUE: Multidetector CT imaging of the abdomen and pelvis was performed following the standard protocol without IV contrast. RADIATION DOSE REDUCTION: This exam was performed according to the departmental dose-optimization program which includes automated exposure control, adjustment of the mA and/or kV according to patient size and/or use of iterative reconstruction technique. COMPARISON:  None Available. FINDINGS: Lower chest: Heart is enlarged. Coronary artery calcifications are present. No pericardial effusion. Large hiatal hernia. Hepatobiliary: Liver parenchyma is hyperdense, consistent with prior amiodarone treatment. Sub centimeter cysts are noted in the liver, largest in the LEFT hepatic lobe measuring 0.8 centimeters. No further imaging is recommended. No suspicious liver lesion. Gallbladder is present and unremarkable. Pancreas: Unremarkable. No pancreatic ductal dilatation or surrounding inflammatory changes. Spleen: Normal in size without focal  abnormality. Adrenals/Urinary Tract: Normal adrenal glands. There is bilateral hydronephrosis to level of the mid ureters. No evidence for obstructing calculi. The urinary bladder is markedly distended. Stomach/Bowel: Large hiatal hernia. Stomach is otherwise normal. Small bowel loops are unremarkable. The appendix is well seen and normal in appearance. There is significant, consolidated appearing stool within the rectum, measuring 10.2 x 7.7 x 10.1 centimeters, consistent with impaction. Numerous colonic diverticular present. No evidence for acute diverticulitis or colonic mass. Vascular/Lymphatic: There is dense atherosclerotic calcification of the abdominal aorta. Aorta is tortuous but not aneurysmal. Reproductive: Uterus is present.  No adnexal mass. Other: No abdominal wall hernia or abnormality. No abdominopelvic ascites. Musculoskeletal: LEFT hip arthroplasty. No evidence for acute abnormality. IMPRESSION: 1. Rectosigmoid stool impaction, measuring 10 centimeters. 2. Marked distention of the urinary bladder. 3. Bilateral hydronephrosis to the level of the mid ureters. The hydronephrosis may be related to marked distension of the urinary bladder. No evidence for obstructing calculi. 4. Large hiatal hernia. 5. Colonic diverticulosis without acute diverticulitis. 6. Hyperdense liver, consistent with prior amiodarone treatment. 7. Cardiomegaly. Coronary artery calcifications. Electronically Signed   By: Norva Pavlov M.D.   On: 08/18/2022 11:33   DG Chest Port 1 View  Result Date: 09/03/2022 CLINICAL DATA:  Shortness of breath, weakness EXAM: PORTABLE CHEST 1 VIEW COMPARISON:  07/27/2021 FINDINGS: Stable cardiomegaly. Aortic atherosclerosis. Hiatal hernia. Patchy opacity in the right upper lobe. Mild bibasilar atelectasis. No large pleural fluid collection. No pneumothorax. Osseous structures are demineralized. IMPRESSION: Patchy opacity in the right upper lobe suspicious for pneumonia. Electronically  Signed   By: Janyth Pupa  Plundo D.O.   On: 08/16/2022 11:06    Microbiology: Recent Results (from the past 240 hour(s))  Resp Panel by RT-PCR (Flu A&B, Covid) Anterior Nasal Swab     Status: Abnormal   Collection Time: 10-Aug-2022 11:40 AM   Specimen: Anterior Nasal Swab  Result Value Ref Range Status   SARS Coronavirus 2 by RT PCR POSITIVE (A) NEGATIVE Final    Comment: (NOTE) SARS-CoV-2 target nucleic acids are DETECTED.  The SARS-CoV-2 RNA is generally detectable in upper respiratory specimens during the acute phase of infection. Positive results are indicative of the presence of the identified virus, but do not rule out bacterial infection or co-infection with other pathogens not detected by the test. Clinical correlation with patient history and other diagnostic information is necessary to determine patient infection status. The expected result is Negative.  Fact Sheet for Patients: EntrepreneurPulse.com.au  Fact Sheet for Healthcare Providers: IncredibleEmployment.be  This test is not yet approved or cleared by the [place] FDA and  has been authorized for detection and/or diagnosis of SARS-CoV-2 by FDA under an Emergency Use Authorization (EUA).  This EUA will remain in effect (meaning this test can be used) for the duration of  the COVID-19 declaration under Section 564(b)(1) of the A ct, 21 U.S.C. section 360bbb-3(b)(1), unless the authorization is terminated or revoked sooner.     Influenza A by PCR NEGATIVE NEGATIVE Final   Influenza B by PCR NEGATIVE NEGATIVE Final    Comment: (NOTE) The Xpert Xpress SARS-CoV-2/FLU/RSV plus assay is intended as an aid in the diagnosis of influenza from Nasopharyngeal swab specimens and should not be used as a sole basis for treatment. Nasal washings and aspirates are unacceptable for Xpert Xpress SARS-CoV-2/FLU/RSV testing.  Fact Sheet for  Patients: EntrepreneurPulse.com.au  Fact Sheet for Healthcare Providers: IncredibleEmployment.be  This test is not yet approved or cleared by the [place] FDA and has been authorized for detection and/or diagnosis of SARS-CoV-2 by FDA under an Emergency Use Authorization (EUA). This EUA will remain in effect (meaning this test can be used) for the duration of the COVID-19 declaration under Section 564(b)(1) of the Act, 21 U.S.C. section 360bbb-3(b)(1), unless the authorization is terminated or revoked.  Performed at [facility], [place] [address], [place], [place] [address]     Time spent: 25 minutes  Signed: Terrilee Croak, MD 08/22/2022

## 2022-09-06 NOTE — Progress Notes (Signed)
At a approximately 1:30 am patient noted to have decreased responsiveness, oxygen saturation 72% on 14L oxygen via [place]. Rapid response summoned, Lasix IV and breathing treatment given. ABG sample was unsuccessful, VBG sample taken off. Patient oxygen saturation increased to 94% on 15Lhigh flow [place], other vital signs remain stable. Patient responsiveness has improved and relatives in attendance.

## 2022-09-06 NOTE — Progress Notes (Signed)
PT Cancellation Note  Patient Details Name: ABIE KILLIAN MRN: [id number] DOB: 11-17-1923   Cancelled Treatment:     PT eval received. Pt has been changed to comfort care and has had a recent decline in medical status. PT is not indicated at this time. Pt will be d/c from PT services and please reorder if or when medically stable.     Lelon Mast 08/23/2022, 11:55 AM

## 2022-09-06 NOTE — Progress Notes (Signed)
PROGRESS NOTE  DONOVAN GATCHEL  DOB: [birth year]  PCP: Shon Hale, MD NLZ:[id number]  DOA: 08/11/2022  LOS: 3 days  Hospital Day: 4  Brief narrative: Stephanie Reyes is a 88 y.o. female with PMH significant for paroxysmal atrial fibrillation on amiodarone and Eliquis, status post DC cardioversion in 2022, history of embolic stroke in 2022, essential hypertension, who was in her usual state of health until 9/30, quite functional at baseline. Sunday 10/1, patient had a fever, developed cough, appetite went down making her weak.  EMS was called and they gave her fluid after which she perked up but again felt worse and was brought to the ED next day on 10/2. In the ED, she was found to be COVID-19 positive. Chest x-ray showed right upper lobe opacity. LFTs showed significant transaminitis. CT abdomen showed marked distention of the urinary bladder.Bilateral hydronephrosis to the level of the mid ureters. No evidence for obstructing calculi. Foley catheter was placed in ED. Patient was hospitalized for further evaluation and management.  Patient was started on IV steroids, IV fluid, high flow oxygen as per her requirement.  On my discussion with the family on 10/4, family wanted to see if she perks up with these measures in the next 24 to 48 hours. Overnight on 10/4 to 10/5, patient went to respiratory distress. Rapid response was called.  After discussion with family members, patient was made comfort care.  At the time of my evaluation this morning, patient was somnolent, open eyes and verbal command.  She verbalized that she is uncomfortable and wants to be comfortable.  Multiple family members at bedside were very understandable and agreeable to respect patient's wishes.  Family is expecting some other members to join later this morning.  Once family is okay, we will switch her from high flow oxygen to comfort focused low-flow oxygen.  For now patient has order for morphine IV as  well as Ativan IV as part of comfort care measures.   Further care to be driven as per patient comfort needs as well as family's wish.      Subjective: Patient was seen and examined this morning.  Events of last night noted as mentioned above.   Assessment and plan: Comfort Care status Patient has been made comfort care since last night with worsening respiratory failure. As needed IV morphine, as needed IV Ativan, no IV fluid, no antibiotics, no blood draws or fingersticks. To switch to low-flow oxygen once family is okay.  Acute issues managed in the hospital as below COVID pneumonia Acute respiratory failure with hypoxia  Acute liver failure Acute urinary retention Bilateral hydronephrosis Failure to thrive Nausea and vomiting Hypokalemia Lactic acidosis CKD3b Hypothyroidism History of paroxysmal atrial fibrillation s/p cardioversion 2022 Macrocytic anemia Thrombocytopenia, mild Severe constipation with fecal impaction History of embolic stroke in 2022 Concern for skin cancer  Goals of care   Code Status: DNR comfort care status Mobility: PT eval ordered  Skin assessment:  Pressure Injury 08/08/22 Buttocks Right;Left;Mid;Posterior Deep Tissue Pressure Injury - Purple or maroon localized area of discolored intact skin or blood-filled blister due to damage of underlying soft tissue from pressure and/or shear. (Active)  08/08/22 2025  Location: Buttocks  Location Orientation: Right;Left;Mid;Posterior  Staging: Deep Tissue Pressure Injury - Purple or maroon localized area of discolored intact skin or blood-filled blister due to damage of underlying soft tissue from pressure and/or shear.  Wound Description (Comments):   Present on Admission: Yes    Nutritional status:  Body  mass index is 18.65 kg/m.          Diet:  Diet Order             Diet regular Room service appropriate? Yes; Fluid consistency: Thin  Diet effective now                   DVT  prophylaxis:  SCDs Start: 08/11/2022 1702   Infusions:     Scheduled Meds:  Chlorhexidine Gluconate Cloth  6 each Topical Daily   methylPREDNISolone (SOLU-MEDROL) injection  0.5 mg/kg Intravenous Q12H   Followed by   Derrill Memo ON 08/11/2022] predniSONE  50 mg Oral Daily   mouth rinse  15 mL Mouth Rinse 4 times per day    PRN meds: guaiFENesin-dextromethorphan, haloperidol lactate, ipratropium-albuterol, lip balm, LORazepam, morphine injection, ondansetron **OR** ondansetron (ZOFRAN) IV, mouth rinse   Antimicrobials: Anti-infectives (From admission, onward)    Start     Dose/Rate Route Frequency Ordered Stop   08/08/22 1330  azithromycin (ZITHROMAX) 500 mg in sodium chloride 0.9 % 250 mL IVPB  Status:  Discontinued        500 mg 250 mL/hr over 60 Minutes Intravenous Every 24 hours 08/21/2022 1706 08-24-2022 0841   08/08/22 1200  cefTRIAXone (ROCEPHIN) 2 g in sodium chloride 0.9 % 100 mL IVPB  Status:  Discontinued        2 g 200 mL/hr over 30 Minutes Intravenous Every 24 hours 08/31/2022 1706 08-24-2022 0841   08/08/22 1000  remdesivir 100 mg in sodium chloride 0.9 % 100 mL IVPB  Status:  Discontinued       See Hyperspace for full Linked Orders Report.   100 mg 200 mL/hr over 30 Minutes Intravenous Daily 08/13/2022 1625 09/02/2022 1629   08/08/22 1000  remdesivir 100 mg in sodium chloride 0.9 % 100 mL IVPB  Status:  Discontinued       See Hyperspace for full Linked Orders Report.   100 mg 200 mL/hr over 30 Minutes Intravenous Daily 08/19/2022 1631 09/05/2022 1643   08/09/2022 1700  remdesivir 100 mg in sodium chloride 0.9 % 100 mL IVPB  Status:  Discontinued       See Hyperspace for full Linked Orders Report.   100 mg 200 mL/hr over 30 Minutes Intravenous Every hour 08/20/2022 1631 08/25/2022 1643   08/24/2022 1630  remdesivir 200 mg in sodium chloride 0.9% 250 mL IVPB  Status:  Discontinued       See Hyperspace for full Linked Orders Report.   200 mg 580 mL/hr over 30 Minutes Intravenous Once 08/15/2022  1625 08/06/2022 1629   08/11/2022 1500  nirmatrelvir/ritonavir EUA (PAXLOVID) 3 tablet  Status:  Discontinued        3 tablet Oral 2 times daily 08/13/2022 1447 08/23/2022 1503   08/12/2022 1145  cefTRIAXone (ROCEPHIN) 1 g in sodium chloride 0.9 % 100 mL IVPB        1 g 200 mL/hr over 30 Minutes Intravenous  Once 08/13/2022 1141 08/15/2022 1330   08/28/2022 1145  azithromycin (ZITHROMAX) 500 mg in sodium chloride 0.9 % 250 mL IVPB        500 mg 250 mL/hr over 60 Minutes Intravenous  Once 08/06/2022 1141 08/27/2022 1632       Objective: Vitals:   24-Aug-2022 0214 2022/08/24 0617  BP: 136/68 116/86  Pulse: 70 80  Resp: (!) 23 20  Temp:  98.1 F (36.7 C)  SpO2: 91% 91%   No intake or output data in the 24  hours ending 08/31/2022 0846  Filed Weights   08/21/2022 2053 08/08/22 1728  Weight: 43.1 kg 50.8 kg   Weight change:  Body mass index is 18.65 kg/m.   Physical Exam: General exam: Pleasant, elderly.  Air hunger are noted. I did not do a detailed examination because of comfort care status.  Data Review: I have personally reviewed the laboratory data and studies available.  Signed, Lorin Glass, MD [facility] 2022/08/31

## 2022-09-06 NOTE — Care Management Important Message (Signed)
Important Message  Patient Details IM Letter given. Name: Stephanie Reyes MRN: [id number] Date of Birth: Mar 29, 1924   Medicare Important Message Given:  Yes     Kerin Salen 09-01-2022, 9:36 AM

## 2022-09-06 NOTE — Significant Event (Signed)
Rapid Response Event Note   Reason for Call :  Low oxygen saturation readings in 70's.   Initial Focused Assessment:  Patient with acute COVID infection placed on 15L HFNC prior to arrival. Bilateral wheezes heard in lungs after breathing treatment. Patient's skin is fragile, slight peripheral edema seen; she is alert, but not interacting much with staff. Patient is afebrile, HR 60-70's, respiratory rate 20's, BP 130-140/60's. No obvious signs of pain or severe distress. With HFNC, saturations improved to high 80's-low 90's. Chest xray done just prior to arrival showed pulmonary edema and bedside RN administering Lasix as prescribed by provider. Patient's urinary output for entire shift has been no more than 50 ml and is dark brown like coffee.   Interventions:  Focused assessment and interventions as above. Reviewed chart and medical history with family at bedside. Morning labs drawn (CMP, CBC, PT/INR , Lactic acid, T4, Mag, Phos) as well as venous blood gas. At least five attempts between two respiratory therapists unsuccessful for obtaining ABG. Provided education on what tests were done and why, known results, interventions already implemented and those that could be attempted based upon results, and effect of COVID in light of her significant medical history. Encouraged further discussion amongst themselves and patient about goals of care.  Plan of Care:  Patient will remain in current level of care at this time with HFNC and continuous telemetry and pulse oximetry measurements. Family will visit with patient two at a time per hospital policy and discuss outcome/decisions with provider later today. Family knows to alert staff with call bell for any needs. By end of visit, saturations remaining around 90% on 14L HFNC.  Event Summary:   MD Notified: Raenette Rover, NP Call Time: [place] Time: 1062 End Time: [place], RN

## 2022-09-06 NOTE — Progress Notes (Signed)
Two respiratory therapists unsuccessful for obtaining ABG. RR RN and NP at bedside.

## 2022-09-06 NOTE — Progress Notes (Signed)
Remaining of morphine gtt wasted in stericycle along with Isla Pence. Total amount of morphine wasted 100cc.

## 2022-09-06 NOTE — Progress Notes (Signed)
Pt pronounced and confirmed deceased along with Ellin Saba ,Therapist, sports. at 1420 after receiving a call from telemetry at 1416 verifying at 1416 she displayed asystole on the monitor.

## 2022-09-06 NOTE — Progress Notes (Signed)
       CROSS COVER NOTE  NAME: Stephanie Reyes MRN: [id number] DOB : 09/02/1924    Date of Service   08/11/2022   HPI/Events of Note   Notified by bedside RN of O2 saturation in 70s.  Patient currently on 15 L high flow nasal cannula.  Bedside assessment, patient is using accessory muscles and has bilateral crackles.  Chest x-ray shows increased edema and effusion.  20 mg Lasix ordered and DuoNeb added to assist with respiration.   Unable to obtain ABG, however venous blood gas does not indicate need for BiPAP at the moment.  Family member is at bedside and has called Stephanie Reyes, the patient's son.  The patient is at times alert, but oriented only to self and place.  0400-after discussing the care with Stephanie Reyes, he would like to make Stephanie Reyes comfort care.  Stephanie Reyes, believes that this is what his mother wanted.  She would not want to continue with aggressive measures that include labs or BiPAP.  Morphine order was placed, RN to follow-up if increased dose or frequency needed.    Interventions/ Plan   Comfort care        Stephanie Rover, DNP, [place]

## 2022-09-06 DEATH — deceased

## 2023-01-10 ENCOUNTER — Ambulatory Visit: Payer: Medicare Other | Admitting: Internal Medicine
